# Patient Record
Sex: Female | Born: 1945 | Race: Black or African American | Hispanic: No | Marital: Single | Smoking: Never smoker
Health system: Southern US, Community
[De-identification: ages and names within clinical notes are randomized; demographics above are authoritative.]

## PROBLEM LIST (undated history)

## (undated) DIAGNOSIS — N393 Stress incontinence (female) (male): Secondary | ICD-10-CM

## (undated) DIAGNOSIS — F419 Anxiety disorder, unspecified: Secondary | ICD-10-CM

## (undated) DIAGNOSIS — K219 Gastro-esophageal reflux disease without esophagitis: Secondary | ICD-10-CM

## (undated) DIAGNOSIS — K5730 Diverticulosis of large intestine without perforation or abscess without bleeding: Secondary | ICD-10-CM

## (undated) DIAGNOSIS — R002 Palpitations: Secondary | ICD-10-CM

## (undated) DIAGNOSIS — J449 Chronic obstructive pulmonary disease, unspecified: Secondary | ICD-10-CM

## (undated) DIAGNOSIS — I10 Essential (primary) hypertension: Secondary | ICD-10-CM

## (undated) DIAGNOSIS — E663 Overweight: Secondary | ICD-10-CM

## (undated) DIAGNOSIS — R0789 Other chest pain: Secondary | ICD-10-CM

## (undated) DIAGNOSIS — J4489 Other specified chronic obstructive pulmonary disease: Secondary | ICD-10-CM

## (undated) HISTORY — PX: BREAST LUMPECTOMY: SHX2

## (undated) HISTORY — DX: Gastro-esophageal reflux disease without esophagitis: K21.9

## (undated) HISTORY — DX: Stress incontinence (female) (male): N39.3

## (undated) HISTORY — DX: Diverticulosis of large intestine without perforation or abscess without bleeding: K57.30

## (undated) HISTORY — DX: Other specified chronic obstructive pulmonary disease: J44.89

## (undated) HISTORY — DX: Anxiety disorder, unspecified: F41.9

## (undated) HISTORY — DX: Other chest pain: R07.89

## (undated) HISTORY — DX: Palpitations: R00.2

## (undated) HISTORY — DX: Essential (primary) hypertension: I10

## (undated) HISTORY — DX: Chronic obstructive pulmonary disease, unspecified: J44.9

## (undated) HISTORY — PX: ABDOMINAL HYSTERECTOMY: SHX81

## (undated) HISTORY — DX: Overweight: E66.3

---

## 2001-11-18 ENCOUNTER — Encounter: Payer: Self-pay | Admitting: Emergency Medicine

## 2001-11-18 ENCOUNTER — Emergency Department (HOSPITAL_COMMUNITY): Admission: EM | Admit: 2001-11-18 | Discharge: 2001-11-18 | Payer: Self-pay | Admitting: Emergency Medicine

## 2002-09-30 ENCOUNTER — Encounter: Payer: Self-pay | Admitting: Pulmonary Disease

## 2002-09-30 ENCOUNTER — Encounter: Admission: RE | Admit: 2002-09-30 | Discharge: 2002-09-30 | Payer: Self-pay | Admitting: Pulmonary Disease

## 2003-01-02 ENCOUNTER — Other Ambulatory Visit: Admission: RE | Admit: 2003-01-02 | Discharge: 2003-01-02 | Payer: Self-pay | Admitting: Obstetrics and Gynecology

## 2003-04-17 ENCOUNTER — Ambulatory Visit (HOSPITAL_COMMUNITY): Admission: RE | Admit: 2003-04-17 | Discharge: 2003-04-17 | Payer: Self-pay | Admitting: Gastroenterology

## 2004-09-01 ENCOUNTER — Ambulatory Visit: Payer: Self-pay | Admitting: Pulmonary Disease

## 2005-06-07 ENCOUNTER — Other Ambulatory Visit: Admission: RE | Admit: 2005-06-07 | Discharge: 2005-06-07 | Payer: Self-pay | Admitting: Obstetrics and Gynecology

## 2005-08-10 ENCOUNTER — Ambulatory Visit: Payer: Self-pay | Admitting: Pulmonary Disease

## 2005-11-16 ENCOUNTER — Ambulatory Visit: Payer: Self-pay | Admitting: Pulmonary Disease

## 2006-10-05 ENCOUNTER — Ambulatory Visit: Payer: Self-pay | Admitting: Pulmonary Disease

## 2007-08-28 ENCOUNTER — Ambulatory Visit (HOSPITAL_COMMUNITY): Admission: RE | Admit: 2007-08-28 | Discharge: 2007-08-28 | Payer: Self-pay | Admitting: Pulmonary Disease

## 2007-08-28 ENCOUNTER — Ambulatory Visit: Payer: Self-pay | Admitting: Pulmonary Disease

## 2007-09-04 DIAGNOSIS — J309 Allergic rhinitis, unspecified: Secondary | ICD-10-CM

## 2007-09-04 DIAGNOSIS — F411 Generalized anxiety disorder: Secondary | ICD-10-CM | POA: Insufficient documentation

## 2007-09-04 DIAGNOSIS — K219 Gastro-esophageal reflux disease without esophagitis: Secondary | ICD-10-CM | POA: Insufficient documentation

## 2007-09-04 DIAGNOSIS — I10 Essential (primary) hypertension: Secondary | ICD-10-CM | POA: Insufficient documentation

## 2007-09-20 ENCOUNTER — Encounter: Payer: Self-pay | Admitting: Pulmonary Disease

## 2007-10-04 ENCOUNTER — Telehealth (INDEPENDENT_AMBULATORY_CARE_PROVIDER_SITE_OTHER): Payer: Self-pay | Admitting: *Deleted

## 2007-12-18 ENCOUNTER — Ambulatory Visit: Payer: Self-pay | Admitting: Pulmonary Disease

## 2008-01-11 ENCOUNTER — Telehealth: Payer: Self-pay | Admitting: Pulmonary Disease

## 2008-02-20 ENCOUNTER — Telehealth (INDEPENDENT_AMBULATORY_CARE_PROVIDER_SITE_OTHER): Payer: Self-pay | Admitting: *Deleted

## 2008-02-21 ENCOUNTER — Encounter: Payer: Self-pay | Admitting: Pulmonary Disease

## 2008-04-16 ENCOUNTER — Telehealth: Payer: Self-pay | Admitting: Pulmonary Disease

## 2008-04-17 ENCOUNTER — Encounter: Payer: Self-pay | Admitting: Pulmonary Disease

## 2008-05-12 ENCOUNTER — Telehealth (INDEPENDENT_AMBULATORY_CARE_PROVIDER_SITE_OTHER): Payer: Self-pay | Admitting: *Deleted

## 2008-05-23 ENCOUNTER — Ambulatory Visit: Payer: Self-pay | Admitting: Pulmonary Disease

## 2008-05-23 DIAGNOSIS — R002 Palpitations: Secondary | ICD-10-CM | POA: Insufficient documentation

## 2008-05-23 DIAGNOSIS — J449 Chronic obstructive pulmonary disease, unspecified: Secondary | ICD-10-CM | POA: Insufficient documentation

## 2008-05-23 DIAGNOSIS — R0789 Other chest pain: Secondary | ICD-10-CM | POA: Insufficient documentation

## 2008-05-23 DIAGNOSIS — N39498 Other specified urinary incontinence: Secondary | ICD-10-CM | POA: Insufficient documentation

## 2008-06-05 ENCOUNTER — Ambulatory Visit: Payer: Self-pay | Admitting: Internal Medicine

## 2008-08-01 ENCOUNTER — Encounter: Payer: Self-pay | Admitting: Pulmonary Disease

## 2008-11-26 ENCOUNTER — Ambulatory Visit: Payer: Self-pay | Admitting: Pulmonary Disease

## 2008-11-26 DIAGNOSIS — K5289 Other specified noninfective gastroenteritis and colitis: Secondary | ICD-10-CM

## 2008-11-26 DIAGNOSIS — E663 Overweight: Secondary | ICD-10-CM | POA: Insufficient documentation

## 2008-11-26 DIAGNOSIS — K5730 Diverticulosis of large intestine without perforation or abscess without bleeding: Secondary | ICD-10-CM | POA: Insufficient documentation

## 2009-02-25 ENCOUNTER — Telehealth: Payer: Self-pay | Admitting: Pulmonary Disease

## 2009-05-26 ENCOUNTER — Ambulatory Visit: Payer: Self-pay | Admitting: Pulmonary Disease

## 2009-05-27 LAB — CONVERTED CEMR LAB
ALT: 19 units/L (ref 0–35)
AST: 19 units/L (ref 0–37)
Albumin: 4 g/dL (ref 3.5–5.2)
Alkaline Phosphatase: 76 units/L (ref 39–117)
BUN: 10 mg/dL (ref 6–23)
Basophils Absolute: 0 10*3/uL (ref 0.0–0.1)
Basophils Relative: 0.9 % (ref 0.0–3.0)
Bilirubin, Direct: 0 mg/dL (ref 0.0–0.3)
CO2: 31 meq/L (ref 19–32)
Calcium: 9.2 mg/dL (ref 8.4–10.5)
Chloride: 103 meq/L (ref 96–112)
Creatinine, Ser: 0.7 mg/dL (ref 0.4–1.2)
Eosinophils Absolute: 0.1 10*3/uL (ref 0.0–0.7)
Eosinophils Relative: 4 % (ref 0.0–5.0)
GFR calc non Af Amer: 109.37 mL/min (ref >60)
Glucose, Bld: 89 mg/dL (ref 70–99)
HCT: 35.7 % — ABNORMAL LOW (ref 36.0–46.0)
Hemoglobin: 12.3 g/dL (ref 12.0–15.0)
Lymphocytes Relative: 32.2 % (ref 12.0–46.0)
Lymphs Abs: 1.1 10*3/uL (ref 0.7–4.0)
MCHC: 34.3 g/dL (ref 30.0–36.0)
MCV: 87.1 fL (ref 78.0–100.0)
Monocytes Absolute: 0.4 10*3/uL (ref 0.1–1.0)
Monocytes Relative: 13 % — ABNORMAL HIGH (ref 3.0–12.0)
Neutro Abs: 1.8 10*3/uL (ref 1.4–7.7)
Neutrophils Relative %: 49.9 % (ref 43.0–77.0)
Platelets: 249 10*3/uL (ref 150.0–400.0)
Potassium: 3.1 meq/L — ABNORMAL LOW (ref 3.5–5.1)
RBC: 4.1 M/uL (ref 3.87–5.11)
RDW: 12.7 % (ref 11.5–14.6)
Sodium: 141 meq/L (ref 135–145)
TSH: 1.3 microintl units/mL (ref 0.35–5.50)
Total Bilirubin: 0.4 mg/dL (ref 0.3–1.2)
Total Protein: 8.2 g/dL (ref 6.0–8.3)
WBC: 3.4 10*3/uL — ABNORMAL LOW (ref 4.5–10.5)

## 2009-11-24 ENCOUNTER — Telehealth (INDEPENDENT_AMBULATORY_CARE_PROVIDER_SITE_OTHER): Payer: Self-pay | Admitting: *Deleted

## 2009-12-01 ENCOUNTER — Ambulatory Visit: Payer: Self-pay | Admitting: Pulmonary Disease

## 2009-12-21 ENCOUNTER — Telehealth: Payer: Self-pay | Admitting: Pulmonary Disease

## 2010-03-10 ENCOUNTER — Telehealth (INDEPENDENT_AMBULATORY_CARE_PROVIDER_SITE_OTHER): Payer: Self-pay | Admitting: *Deleted

## 2010-08-02 ENCOUNTER — Ambulatory Visit: Payer: Self-pay | Admitting: Pulmonary Disease

## 2010-08-05 ENCOUNTER — Telehealth (INDEPENDENT_AMBULATORY_CARE_PROVIDER_SITE_OTHER): Payer: Self-pay | Admitting: *Deleted

## 2010-11-16 NOTE — Assessment & Plan Note (Signed)
Summary: rov/ mbw   CC:  follow up. Marland Kitchen  History of Present Illness: 65 y/o BFwith known hx fo Asthma, GERD, HTN   ~  Followed for mod COPD/ Asthma and when seen 11/08 she was out of all of her meds x months... we refilled her Advair & Proair and she has improved, but still needs the Proair rescue 1-2x per day... 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  ~  She had atyp CP in 7/09 & seen in ER [facility]) in [place] & referred to [facility]- we do not have any of these records- she denies recurrent CP, palpit, etc...    ~  Aug09:  over the last year she has spent most of her energies trying to get disability- and she is happy to report to me today that she has qualified and started receiving her checks... hx of non-compliance w/ med Rx in the past- I called the [facility] in [place] and they confirmed that she has been taking her meds regularly...   ~  Feb10:  she reports a few "respiratory situations" over the last several months- all treated at the [facility] in [place] (staffed by [facility])... Oct09 Rx'd w/ Doxy;  Dec09 Rx'd w/ Doxy;  Jan10 Rx'd w/ Avelox... she states she's been taking her regular meds- all refilled today... she is here to get her disability form completed today...   ~  Aug10:  6 month ROV w/ disability forms to be completed today (every 6 months)... the hot weather bothers her breathing & she tries to stay cool indoors... she has mult symptoms and a generally pos ROS- all appears stable, no changes...     08/02/10--Presents for follow up and med refills. Since last visit says she is at her baseline. She has good days and bad days.  Gets winded with some activity and has to rest. No flare in cough or wheezing. She does not exercise on regular basis. No increased rescue inhaler use. She needs refills but left her list at home-will call back. On average uses her rescue  2/x month. We discussed getting labs today however she declined says she had them done in [place] recently-unclear at where [place] they were done at. I have suggested that she return for physical with Dr. Kriste Basque. She says she will schedule this. Denies chest pain,  orthopnea, hemoptysis, fever, n/v/d, edema, headache.   Current Medications (verified): 1)  Flonase 50 Mcg/act  Susp (Fluticasone Propionate) .... Inhale Two Sprays in Each Nostril Once Daily 2)  Allegra 180 Mg  Tabs (Fexofenadine Hcl) .... Take One Tab By Mouth Once Daily As Needed For Allergies 3)  Advair Diskus 250-50 Mcg/dose  Misc (Fluticasone-Salmeterol) .... Inhale One Puff Two Times A Day 4)  Ventolin Hfa 108 (90 Base) Mcg/act  Aers (Albuterol Sulfate) .Marland Kitchen.. 1-2 Puffs Every 4-6 Hours As Needed 5)  Bayer Low Strength 81 Mg  Tbec (Aspirin) .... Take One Tab By Mouth Once Daily 6)  Tenoretic 100 100-25 Mg  Tabs (Atenolol-Chlorthalidone) .... Take 1 Tab By Mouth Once Daily.Marland KitchenMarland Kitchen 7)  Diltiazem Hcl 90 Mg Tabs (Diltiazem Hcl) .Marland Kitchen.. 1 By Mouth Three Times A Day 8)  Potassium Chloride Cr 10 Meq Cr-Caps (Potassium Chloride) .... Take 2 Caps By Mouth Two Times A Day... 9)  Prilosec Otc 20 Mg  Tbec (Omeprazole Magnesium) .... Take Once Daily 10)  Zantac 300 Mg  Tabs (Ranitidine Hcl) .... Take One Tab By Mouth At Bedtime 11)  Dicyclomine Hcl 20 Mg Tabs (Dicyclomine Hcl) .... Take 1 Tab By Mouth Three Times A Day As Needed For Abd Cramping... 12)  Alprazolam 0.25 Mg Tabs (Alprazolam) .... Take 1 Tab By Mouth Up To Three Times A Day As Needed For Nerves. 13)  Singulair 10 Mg Tabs (Montelukast Sodium) .... Take One By Mouth Once Daily 14)  Tobramycin-Dexamethasone 0.3-0.1 % Susp (Tobramycin-Dexamethasone) .... 2 Drops in Both Eyes 3 Times A Day 15)  Artificial Tears  Soln (Artificial Tear Solution) .... Take As Needed For Dry Eyes  Allergies (verified): No Known Drug Allergies  Comments:  Nurse/Medical Assistant: The patient's medications and  allergies were reviewed with the patient and were updated in the Medication and Allergy Lists.  Past History:  Past Medical History: Last updated: 12/01/2009  ALLERGIC RHINITIS (ICD-477.9) CHRONIC OBSTRUCTIVE ASTHMA UNSPECIFIED (ICD-493.20) HYPERTENSION (ICD-401.9) CHEST PAIN, ATYPICAL (ICD-786.59) PALPITATIONS (ICD-785.1) OVERWEIGHT (ICD-278.02) GERD (ICD-530.81) DIVERTICULOSIS OF COLON (ICD-562.10) STRESS INCONTINENCE (ICD-788.39) ANXIETY (ICD-300.00)  Past Surgical History: Last updated: 12/01/2009 S/P hysterectomy  Family History: Last updated: 12/01/2009 mother alive age 77 w/ renal failure on hospice father deceased age 64 from kidney failure 1 sibling alive age 20  Social History: Last updated: 11/26/2008 non smoker drinks caffeine--1 cup every morning no etoh divorced 1 child  Review of Systems      See HPI  Vital Signs:  Patient profile:   64 year old female Height:      68 inches Weight:      206 pounds BMI:     31.44 O2 Sat:      98 % on Room air Temp:     97.9 degrees F oral Pulse rate:   86 / minute BP sitting:   140 / 68  (left arm) Cuff size:   large  Vitals Entered By: Abigail Miyamoto RN (August 02, 2010 3:25 PM)  O2 Flow:  Room air  Physical Exam  Additional Exam:  WD, Overweight, 64 y/o BF in NAD... GENERAL:  Alert & oriented; pleasant & cooperative...  HEENT:  [place]/AT, EACs-clear, TMs-wnl, NOSE-clear, THROAT-clear & wnl. NECK:  Supple w/ fairROM; no JVD; normal carotid impulses w/o bruits; no thyromegaly or nodules palpated; no lymphadenopathy. CHEST:  Sl decr BS bilat but clear to P & A; without wheezes/ rales/ or rhonchi heard... HEART:  Regular Rhythm; without murmurs/ rubs/ or gallops detected... ABDOMEN:  Obese, soft & nontender; incr bowel sounds; no organomegaly or masses palpated... EXT: without deformities, mild arthritic changes; no varicose veins/ +venous insuffic/ no edema. NEURO:  CN's intact;  no focal neuro  deficits... DERM:  No lesions noted; no rash etc...    Impression & Recommendations:  Problem # 1:  CHRONIC OBSTRUCTIVE ASTHMA UNSPECIFIED (ICD-493.20)  Compensated on present regimen.  Plan:  Continue on same meds.  Call back for med refills   follow up Dr. Kriste Basque in 6 months  Please contact office for sooner follow up as needed   Orders: Est. Patient Level IV (04540)  Problem # 2:  ALLERGIC RHINITIS (ICD-477.9)  controlled on rx. no flare recently  Her updated medication list for this problem includes:    Flonase 50 Mcg/act Susp (Fluticasone propionate) ..... Inhale two sprays in each nostril once daily    Allegra 180 Mg Tabs (Fexofenadine hcl) .Marland Kitchen... Take one tab by mouth once daily as needed for allergies  Orders: Est. Patient Level IV (98119)  Problem # 3:  HYPERTENSION (ICD-401.9)  controlled on rx  diet and exercise discussed  return for physical and labs.  Her updated medication list for this problem includes:    Tenoretic 100 100-25 Mg Tabs (Atenolol-chlorthalidone) .Marland Kitchen... Take 1 tab by mouth once daily...    Diltiazem Hcl 90 Mg Tabs (Diltiazem hcl) .Marland Kitchen... 1 by mouth three times a day  BP today: 140/68 Prior BP: 136/80 (12/01/2009)  Labs Reviewed: K+: 3.1 (05/26/2009) Creat: : 0.7 (05/26/2009)     Orders: Est. Patient Level IV (14782)  Problem # 4:  GERD (ICD-530.81)  controlled on rex GERD diet discussed  Her updated medication list for this problem includes:    Prilosec Otc 20 Mg Tbec (Omeprazole magnesium) .Marland Kitchen... Take once daily    Zantac 300 Mg Tabs (Ranitidine hcl) .Marland Kitchen... Take one tab by mouth at bedtime    Dicyclomine Hcl 20 Mg Tabs (Dicyclomine hcl) .Marland Kitchen... Take 1 tab by mouth three times a day as needed for abd cramping...  Orders: Est. Patient Level IV (95621)  Problem # 5:  ANXIETY (ICD-300.00)  stress reducers and xanax as needed  Her updated medication list for this problem includes:    Alprazolam 0.25 Mg Tabs (Alprazolam) .Marland Kitchen... Take 1 tab  by mouth up to three times a day as needed for nerves.  Orders: Est. Patient Level IV (30865)  Medications Added to Medication List This Visit: 1)  Singulair 10 Mg Tabs (Montelukast sodium) .... Take one by mouth once daily 2)  Tobramycin-dexamethasone 0.3-0.1 % Susp (Tobramycin-dexamethasone) .... 2 drops in both eyes 3 times a day 3)  Artificial Tears Soln (Artificial tear solution) .... Take as needed for dry eyes  Complete Medication List: 1)  Flonase 50 Mcg/act Susp (Fluticasone propionate) .... Inhale two sprays in each nostril once daily 2)  Advair Diskus 250-50 Mcg/dose Misc (Fluticasone-salmeterol) .... Inhale one puff two times a day 3)  Bayer Low Strength 81 Mg Tbec (Aspirin) .... Take one tab by mouth once daily 4)  Tenoretic 100 100-25 Mg Tabs (Atenolol-chlorthalidone) .... Take 1 tab by mouth once daily.Marland KitchenMarland Kitchen 5)  Diltiazem Hcl 90 Mg Tabs (Diltiazem hcl) .Marland Kitchen.. 1 by mouth three times a day 6)  Potassium Chloride Cr 10 Meq Cr-caps (Potassium chloride) .... Take 2 caps by mouth two times a day... 7)  Prilosec Otc 20 Mg Tbec (Omeprazole magnesium) .... Take once daily 8)  Zantac 300 Mg Tabs (Ranitidine hcl) .... Take one tab by mouth at bedtime 9)  Singulair 10 Mg Tabs (Montelukast sodium) .... Take one by mouth once daily 10)  Tobramycin-dexamethasone 0.3-0.1 % Susp (Tobramycin-dexamethasone) .... 2 drops in both eyes 3 times a day 11)  Artificial Tears Soln (Artificial tear solution) .... Take as needed for dry eyes 12)  Allegra 180 Mg Tabs (Fexofenadine hcl) .... Take one tab by mouth once daily as needed for allergies 13)  Alprazolam 0.25 Mg Tabs (Alprazolam) .... Take 1 tab by mouth up to three times a day as needed for nerves. 14)  Dicyclomine Hcl 20 Mg Tabs (Dicyclomine hcl) .... Take 1 tab by mouth three times a day as needed for abd cramping... 15)  Ventolin Hfa 108 (90 Base) Mcg/act Aers (Albuterol sulfate) .Marland Kitchen.. 1-2 puffs every 4-6 hours as needed  Patient Instructions: 1)   Continue on same meds.  2)  Call back for med refills  3)   follow up Dr. Kriste Basque in 6 months  4)  Please contact office for sooner follow up as needed  Prescriptions: VENTOLIN HFA 108 (90 BASE) MCG/ACT  AERS (ALBUTEROL SULFATE) 1-2 puffs every 4-6 hours as needed  #1 x 3   Entered and Authorized by:   Rubye Oaks NP   Signed by:   Tammy Parrett NP on 08/05/2010   Method used:   Electronically to        [facility] [phone]* (retail)       [address]       [place], [place]  [address]       Ph: [phone]       Fax: [phone]   RxID:   [id number] DICYCLOMINE HCL 20 MG TABS (DICYCLOMINE HCL) take 1 tab by mouth three times a day as needed for abd cramping...  #90 x 6   Entered and Authorized by:   Rubye Oaks NP   Signed by:   Tammy Parrett NP on 08/05/2010   Method used:   Electronically to        [facility] [phone]* (retail)       [address]       [place], [place]  [address]       Ph: [phone]       Fax: [phone]   RxID:   [id number] ALLEGRA 180 MG  TABS (FEXOFENADINE HCL) take one tab by mouth once daily as needed for allergies  #30 x 6   Entered and Authorized by:   Rubye Oaks NP   Signed by:   Tammy Parrett NP on 08/05/2010   Method used:   Electronically to        [facility] [phone]* (retail)       [address]       [place], [place]  [address]       Ph: [phone]       Fax: [phone]   RxID:   [id number] SINGULAIR 10 MG TABS (MONTELUKAST SODIUM) Take one by mouth once daily  #30 x 6   Entered and Authorized by:   Rubye Oaks NP   Signed by:   Tammy Parrett NP on 08/05/2010   Method used:   Electronically to        [facility] [phone]* (retail)       [address]       [place], [place]  [address]       Ph: [phone]       Fax: [phone]   RxID:   [id number] ZANTAC 300 MG  TABS (RANITIDINE HCL) take one tab by mouth at bedtime  #30 x 6   Entered and Authorized by:    Rubye Oaks NP   Signed by:   Tammy Parrett NP on 08/05/2010   Method used:   Electronically to        [facility] [phone]* (retail)       [address]       [place], [place]  [address]       Ph: [phone]       Fax: [phone]   RxID:   [id number] PRILOSEC OTC 20 MG  TBEC (OMEPRAZOLE MAGNESIUM) take once daily  #30 x 6   Entered and Authorized by:   Rubye Oaks NP   Signed by:   Tammy Parrett NP on 08/05/2010   Method used:   Electronically to        [facility] [phone]* (retail)       [address]  [place], [place]  [address]       Ph: [phone]       Fax: [phone]   RxID:   [id number] POTASSIUM CHLORIDE CR 10 MEQ CR-CAPS (POTASSIUM CHLORIDE) take 2 caps by mouth two times a day...  #120 x 6   Entered and Authorized by:   Rubye Oaks NP   Signed by:   Tammy Parrett NP on 08/05/2010   Method used:   Electronically to        [facility] [phone]* (retail)       [address]       [place], [place]  [address]       Ph: [phone]       Fax: [phone]   RxID:   [id number] DILTIAZEM HCL 90 MG TABS (DILTIAZEM HCL) 1 by mouth three times a day  #90 x 6   Entered and Authorized by:   Rubye Oaks NP   Signed by:   Tammy Parrett NP on 08/05/2010   Method used:   Electronically to        [facility] [phone]* (retail)       [address]       [place], [place]  [address]       Ph: [phone]       Fax: [phone]   RxID:   [id number] TENORETIC 100 100-25 MG  TABS (ATENOLOL-CHLORTHALIDONE) take 1 tab by mouth once daily...  #30 x 6   Entered and Authorized by:   Rubye Oaks NP   Signed by:   Tammy Parrett NP on 08/05/2010   Method used:   Electronically to        [facility] [phone]* (retail)       [address]       [place], [place]  [address]       Ph: [phone]       Fax: [phone]   RxID:   [id number] ADVAIR DISKUS 250-50 MCG/DOSE  MISC  (FLUTICASONE-SALMETEROL) inhale one puff two times a day  #1 x 6   Entered and Authorized by:   Rubye Oaks NP   Signed by:   Tammy Parrett NP on 08/05/2010   Method used:   Electronically to        [facility] [phone]* (retail)       [address]       [place], [place]  [address]       Ph: [phone]       Fax: [phone]   RxID:   [id number] FLONASE 50 MCG/ACT  SUSP (FLUTICASONE PROPIONATE) inhale two sprays in each nostril once daily  #1 x 6   Entered and Authorized by:   Rubye Oaks NP   Signed by:   Tammy Parrett NP on 08/05/2010   Method used:   Electronically to        [facility] [phone]* (retail)       [address]       [place], [place]  [address]       Ph: [phone]       Fax: [phone]   RxID:   [id number]

## 2010-11-16 NOTE — Assessment & Plan Note (Signed)
Summary: 6 month rov//td   CC:  6 month ROV & review of mult medical problems....  History of Present Illness: 65 y/o BF here for a follow up visit... she has multiple medical problems as noted below...    ~  Followed for mod COPD/ Asthma and when seen 11/08 she was out of all of her meds x months... we refilled her Advair & Proair and she has improved, but still needs the Proair rescue 1-2x per day... 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  ~  She had atyp CP in 7/09 & seen in ER [facility]) in [place] & referred to [facility]- we do not have any of these records- she denies recurrent CP, palpit, etc...    ~  Aug09:  over the last year she has spent most of her energies trying to get disability- and she is happy to report to me today that she has qualified and started receiving her checks... hx of non-compliance w/ med Rx in the past- I called the [facility] in [place] and they confirmed that she has been taking her meds regularly...   ~  Feb10:  she reports a few "respiratory situations" over the last several months- all treated at the [facility] in [place] (staffed by [facility])... Oct09 Rx'd w/ Doxy;  Dec09 Rx'd w/ Doxy;  Jan10 Rx'd w/ Avelox... she states she's been taking her regular meds- all refilled today... she is here to get her disability form completed today...   ~  Aug10:  6 month ROV w/ disability forms to be completed today (every 6 months)... the hot weather bothers her breathing & she tries to stay cool indoors... she has mult symptoms and a generally pos ROS- all appears stable, no changes...   ~  December 01, 2009:  ROV w/ disability papers recently completed for her... incr stress w/ mother on hospice in [place] due to renal failure- they live together & Eulah cares for her... asking for nerve pill & ALPRAZOLAM 0.25mg  written... she had a viral gastroenteritis 10/10,  seen in the [facility] clinic & resolved... she reports breathing is stable;  BP controleed on meds;  had G[place] check in [place] at [facility] but they didn't say anything about her leakage... due for f/u PFT's today>>   Current Problem List:  ALLERGIC RHINITIS (ICD-477.9) - on ALLEGRA 180mg /d, & FLONASE Qhs...   CHRONIC OBSTRUCTIVE ASTHMA UNSPECIFIED (ICD-493.20) - on ADVAIR 250Bid,  PROAIR HFA Prn (using 2-3 times daily),  MUCINEX 1-2Bid... she states no recent exas, taking her meds regularly although she still uses her Proair daily... she denies cough, sputum, hemoptysis, worsening dyspnea,  wheezing, chest pains, snoring, daytime hypersomnolence, etc...   ~  baseline CXR 11/08 showed sl hyperaeration, clear, NAD.Marland Kitchen. spondylitic changes in TSpine noted.  ~  PFT 3/09 showed FVC= 3.36 (92%), FEV1= 2.13 (78%), %1sec= 63, mid-flows= 26% pred;  TLC= 6.61 (112%), RV= 3.25 (145%), RV/TLC= 49%;  DLCO= norm...  ~  f/u CXR 8/10 showed min bibasilar atx, NAD...  ~  PFT 2/11 showed FVC= 2.22 (73%), FEV1= 1.68 (70%), %1sec=76, mid-flows= 57%pred.  HYPERTENSION (ICD-401.9) - controlled on TENORETIC 100-25 daily,  KCl CAPSULES- 2 Bid,  DILTIAZEM 90mg Tid (from [employer])... BP today = 136/80 and doing well- denies HA, fatigue, visual changes, CP, palipit, dizziness, syncope, dyspnea, edema, etc...  ~  labs 8/10 showed K= 3.1,  BUN= 10, Creat= 0.7... rec> incr K10 to 2Bid...  CHEST PAIN, ATYPICAL (ICD-786.59) - **see above** - she denies recurrent CP, exertional CP, etc...  ~  FLP 12/08 by DrVyas at [facility] showed TChol 130, TG 107, HDL 39, LDL 70...  ~  pt notes she had 2DEcho at [facility] & will request copies of notes to [place]...  PALPITATIONS (ICD-785.1) - hx palpitations in the past... no recent symptoms on meds above...  OVERWEIGHT (ICD-278.02) - we reviewed diet + exercise.  ~  weight 8/09 = 209#  ~  weight 2/10 = 213#  ~  weight 8/10 = 206#  ~  weight 2/11 = 206#  GERD (ICD-530.81) - on PRILOSEC 20mg Qam,   ZANTAC 300mg Qhs... she has a hx of GERD and LER controlled on these meds...   DIVERTICULOSIS OF COLON (ICD-562.10) - last colonoscopy 7/04 by DrJEdwards- showed divertics only...  STRESS INCONTINENCE (ICD-788.39) - she claims leakage of urine and stool- she has been recommended to f/u w/ GYN for this eval...  ~  2/11:  she reports eval at [facility] in [place] 10/10 but they didn't address leakage...  ANXIETY (ICD-300.00) - states that she is under stress due to her condition and having to care for her mother (on hospice w/ renal failure)... Rx for ALPRAZOLAM 0.25mg  Tid Prn written...   Allergies (verified): No Known Drug Allergies  Comments:  Nurse/Medical Assistant: The patient's medications and allergies were reviewed with the patient and were updated in the Medication and Allergy Lists.  Past History:  Past Medical History:  ALLERGIC RHINITIS (ICD-477.9) CHRONIC OBSTRUCTIVE ASTHMA UNSPECIFIED (ICD-493.20) HYPERTENSION (ICD-401.9) CHEST PAIN, ATYPICAL (ICD-786.59) PALPITATIONS (ICD-785.1) OVERWEIGHT (ICD-278.02) GERD (ICD-530.81) DIVERTICULOSIS OF COLON (ICD-562.10) STRESS INCONTINENCE (ICD-788.39) ANXIETY (ICD-300.00)  Past Surgical History: S/P hysterectomy  Family History: Reviewed history from 05/23/2008 and no changes required. mother alive age 32 w/ renal failure on hospice father deceased age 61 from kidney failure 1 sibling alive age 87  Social History: Reviewed history from 11/26/2008 and no changes required. non smoker drinks caffeine--1 cup every morning no etoh divorced 1 child  Review of Systems      See HPI       The patient complains of dyspnea on exertion, incontinence, and difficulty walking.  The patient denies anorexia, fever, weight loss, weight gain, vision loss, decreased hearing, hoarseness, chest pain, syncope, peripheral edema, prolonged cough, headaches, hemoptysis, abdominal pain, melena, hematochezia, severe  indigestion/heartburn, hematuria, muscle weakness, suspicious skin lesions, transient blindness, depression, unusual weight change, abnormal bleeding, enlarged lymph nodes, and angioedema.    Vital Signs:  Patient profile:   65 year old female Height:      68 inches Weight:      205.50 pounds O2 Sat:      99 % on Room air Temp:     97.8 degrees F oral Pulse rate:   85 / minute BP sitting:   136 / 80  (left arm) Cuff size:   regular  Vitals Entered By: Randell Loop CMA (December 01, 2009 1:58 PM)  O2 Sat at Rest %:  99 O2 Flow:  Room air CC: 6 month ROV & review of mult medical problems...   Physical Exam  Additional Exam:  WD, Overweight, 65 y/o BF in NAD... GENERAL:  Alert & oriented; pleasant & cooperative...  HEENT:  [place]/AT, EOM-wnl, PERRLA, EACs-clear, TMs-wnl, NOSE-clear, THROAT-clear & wnl. NECK:  Supple w/ fairROM; no JVD; normal carotid impulses w/o bruits; no thyromegaly or nodules palpated; no lymphadenopathy. CHEST:  Sl  decr BS bilat but clear to P & A; without wheezes/ rales/ or rhonchi heard... HEART:  Regular Rhythm; without murmurs/ rubs/ or gallops detected... ABDOMEN:  Obese, soft & nontender; incr bowel sounds; no organomegaly or masses palpated... EXT: without deformities, mild arthritic changes; no varicose veins/ +venous insuffic/ no edema. NEURO:  CN's intact;  no focal neuro deficits... DERM:  No lesions noted; no rash etc...    Pulmonary Function Test Date: 12/01/2009 2:37 PM Gender: Female  Pre-Spirometry FVC    Value: 2.22 L/min   Pred: 3.06 L/min     % Pred: 73 % FEV1    Value: 1.68 L     Pred: 2.41 L     % Pred: 70 % FEV1/FVC  Value: 76 %     Pred: -- %     % Pred: -- % FEF 25-75  Value: 1.32 L/min   Pred: 2.31 L/min     % Pred: 57 %  Comments: Mild obstruction- predom sm airways... can't r/o superimposed mild restriction w/o LV's...  SN  Impression & Recommendations:  Problem # 1:  CHRONIC OBSTRUCTIVE ASTHMA UNSPECIFIED  (ICD-493.20) Stable on meds... refilled Advair/ Proair & rec to use the Proair sparingly... Her updated medication list for this problem includes:    Advair Diskus 250-50 Mcg/dose Misc (Fluticasone-salmeterol) ..... Inhale one puff two times a day    Ventolin Hfa 108 (90 Base) Mcg/act Aers (Albuterol sulfate) .Marland Kitchen... 1-2 puffs every 4-6 hours as needed  Orders: Spirometry w/Graph (94010)  Problem # 2:  HYPERTENSION (ICD-401.9) Controlled-  same meds. Her updated medication list for this problem includes:    Tenoretic 100 100-25 Mg Tabs (Atenolol-chlorthalidone) .Marland Kitchen... Take 1 tab by mouth once daily...    Diltiazem Hcl 90 Mg Tabs (Diltiazem hcl) .Marland Kitchen... 1 by mouth three times a day  Problem # 3:  PALPITATIONS (ICD-785.1) She denies symptoms & will f/u at [facility] w/ copy of notes to [place]... Her updated medication list for this problem includes:    Tenoretic 100 100-25 Mg Tabs (Atenolol-chlorthalidone) .Marland Kitchen... Take 1 tab by mouth once daily...  Problem # 4:  OVERWEIGHT (ICD-278.02) Diet + exercise are the keys...  Problem # 5:  DIVERTICULOSIS OF COLON (ICD-562.10) GI is stable...  Problem # 6:  STRESS INCONTINENCE (ICD-788.39) She did not mention this today- advised to discuss w/ GYN if the problem is ongoing...  Problem # 7:  ANXIETY (ICD-300.00) Alprazolam 0.25mg  Prn...  Her updated medication list for this problem includes:    Alprazolam 0.25 Mg Tabs (Alprazolam) .Marland Kitchen... Take 1 tab by mouth up to three times a day as needed for nerves.  Complete Medication List: 1)  Flonase 50 Mcg/act Susp (Fluticasone propionate) .... Inhale two sprays in each nostril once daily 2)  Allegra 180 Mg Tabs (Fexofenadine hcl) .... Take one tab by mouth once daily as needed for allergies 3)  Advair Diskus 250-50 Mcg/dose Misc (Fluticasone-salmeterol) .... Inhale one puff two times a day 4)  Ventolin Hfa 108 (90 Base) Mcg/act Aers (Albuterol sulfate) .Marland Kitchen.. 1-2 puffs every 4-6 hours as needed 5)  Bayer Low Strength  81 Mg Tbec (Aspirin) .... Take one tab by mouth once daily 6)  Tenoretic 100 100-25 Mg Tabs (Atenolol-chlorthalidone) .... Take 1 tab by mouth once daily.Marland KitchenMarland Kitchen 7)  Diltiazem Hcl 90 Mg Tabs (Diltiazem hcl) .Marland Kitchen.. 1 by mouth three times a day 8)  Potassium Chloride Cr 10 Meq Cr-caps (Potassium chloride) .... Take 2 caps by mouth two times a day... 9)  Prilosec  Otc 20 Mg Tbec (Omeprazole magnesium) .... Take once daily 10)  Zantac 300 Mg Tabs (Ranitidine hcl) .... Take one tab by mouth at bedtime 11)  Dicyclomine Hcl 20 Mg Tabs (Dicyclomine hcl) .... Take 1 tab by mouth three times a day as needed for abd cramping... 12)  Alprazolam 0.25 Mg Tabs (Alprazolam) .... Take 1 tab by mouth up to three times a day as needed for nerves.  Other Orders: Prescription Created Electronically [phone])  Patient Instructions: 1)  Today we updated your med list- see below.... 2)  We refilled your meds per request including the KCl CAPSULES that you prefer... 3)  We rechecked your Spirometry today- your breathing is stable & you need to stay on your inhalers regularly... 4)  Ask your cardiologist in [place] at [facility] to send a copy of your follow up visit to me please... 5)  Call for any problems.Marland KitchenMarland Kitchen 6)  Please schedule a follow-up appointment in 6 months. Prescriptions: ALPRAZOLAM 0.25 MG TABS (ALPRAZOLAM) take 1 tab by mouth up to three times a day as needed for nerves.  #100 x 6   Entered and Authorized by:   Michele Mcalpine MD   Signed by:   Michele Mcalpine MD on 12/02/2009   Method used:   Print then Give to Patient   RxID:   [id number] DICYCLOMINE HCL 20 MG TABS (DICYCLOMINE HCL) take 1 tab by mouth three times a day as needed for abd cramping...  #90 x prn   Entered and Authorized by:   Michele Mcalpine MD   Signed by:   Michele Mcalpine MD on 12/01/2009   Method used:   Print then Give to Patient   RxID:   [id number] ZANTAC 300 MG  TABS (RANITIDINE HCL) take one tab by mouth at bedtime  #30 x prn    Entered and Authorized by:   Michele Mcalpine MD   Signed by:   Michele Mcalpine MD on 12/01/2009   Method used:   Print then Give to Patient   RxID:   [id number] PRILOSEC OTC 20 MG  TBEC (OMEPRAZOLE MAGNESIUM) take once daily  #30 x prn   Entered and Authorized by:   Michele Mcalpine MD   Signed by:   Michele Mcalpine MD on 12/01/2009   Method used:   Print then Give to Patient   RxID:   [id number] TENORETIC 100 100-25 MG  TABS (ATENOLOL-CHLORTHALIDONE) take 1 tab by mouth once daily...  #30 x prn   Entered and Authorized by:   Michele Mcalpine MD   Signed by:   Michele Mcalpine MD on 12/01/2009   Method used:   Print then Give to Patient   RxID:   [id number] DILTIAZEM HCL 90 MG TABS (DILTIAZEM HCL) 1 by mouth three times a day  #90 x prn   Entered and Authorized by:   Michele Mcalpine MD   Signed by:   Michele Mcalpine MD on 12/01/2009   Method used:   Print then Give to Patient   RxID:   [id number] VENTOLIN HFA 108 (90 BASE) MCG/ACT  AERS (ALBUTEROL SULFATE) 1-2 puffs every 4-6 hours as needed  #1 x prn   Entered and Authorized by:   Michele Mcalpine MD   Signed by:   Michele Mcalpine MD on 12/01/2009   Method used:   Print then Give to Patient   RxID:   [id number] ADVAIR DISKUS 250-50 MCG/DOSE  MISC (  FLUTICASONE-SALMETEROL) inhale one puff two times a day  #1 x prn   Entered and Authorized by:   Michele Mcalpine MD   Signed by:   Michele Mcalpine MD on 12/01/2009   Method used:   Print then Give to Patient   RxID:   [id number] ALLEGRA 180 MG  TABS (FEXOFENADINE HCL) take one tab by mouth once daily as needed for allergies  #30 x prn   Entered and Authorized by:   Michele Mcalpine MD   Signed by:   Michele Mcalpine MD on 12/01/2009   Method used:   Print then Give to Patient   RxID:   [id number] FLONASE 50 MCG/ACT  SUSP (FLUTICASONE PROPIONATE) inhale two sprays in each nostril once daily  #1 x prn   Entered and Authorized by:   Michele Mcalpine MD   Signed by:   Michele Mcalpine MD  on 12/01/2009   Method used:   Print then Give to Patient   RxID:   [id number] POTASSIUM CHLORIDE CR 10 MEQ CR-CAPS (POTASSIUM CHLORIDE) take 2 caps by mouth two times a day...  #120 x prn   Entered and Authorized by:   Michele Mcalpine MD   Signed by:   Michele Mcalpine MD on 12/01/2009   Method used:   Print then Give to Patient   RxID:   [id number]     CardioPerfect Spirometry  ID: [id number] Patient: Rachael Peck, Rachael Peck DOB: 08-08-1946 Age: 65 Years Old Sex: Female Race: Black Physician: [place] Height: 68 Weight: 205.50 Status: Unconfirmed Past Medical History:  ALLERGIC RHINITIS (ICD-477.9) CHRONIC OBSTRUCTIVE ASTHMA UNSPECIFIED (ICD-493.20) HYPERTENSION (ICD-401.9) CHEST PAIN, ATYPICAL (ICD-786.59) PALPITATIONS (ICD-785.1) OVERWEIGHT (ICD-278.02) GERD (ICD-530.81) DIVERTICULOSIS OF COLON (ICD-562.10) STRESS INCONTINENCE (ICD-788.39) ANXIETY (ICD-300.00)   Recorded: 12/01/2009 2:37 PM  Parameter  Measured Predicted %Predicted FVC     2.22        3.06        73 FEV1     1.68        2.41        70 FEV1%   76        79.22        -- PEF    4.20        6.16        68.20   Interpretation:

## 2010-11-16 NOTE — Progress Notes (Signed)
Summary: formulary  Phone Note Call from Patient Call back at Home Phone [phone]   Caller: Patient Call For: Abrar Koone Reason for Call: Talk to Nurse Summary of Call: [employer] is not covering Ventolin Inhaler.  They said they have other cheaper drugs they cover.  Said you need to [employer].  If you want pt to stay on this drug, you can do an exception with insurance company [phone]. Initial call taken by: Eugene Gavia,  December 21, 2009 3:13 PM  Follow-up for Phone Call        spoke with Francine Graven and they staetd ventolin is covered at $5 fro 30 days. I called pt to advise and she read me the letter she received and the problem was she tried to get it refilled too soon. I advised she cant get med refilled too soon and if she is needing it hat much she needs ov. Carron Curie CMA  December 21, 2009 3:38 PM

## 2010-11-16 NOTE — Progress Notes (Signed)
Summary: medications  ---- Converted from flag ---- ---- 08/05/2010 9:38 AM, Rubye Oaks NP wrote: please call pt let know sent refills of all meds.  also please send xanax under Dr. Kriste Basque w/ 5 refills. ------------------------------  Phone Note Outgoing Call   Call placed by: Zackery Barefoot CMA,  August 05, 2010 10:56 AM Summary of Call: [email or web address] x 1 Zackery Barefoot [facility]  August 05, 2010 10:56 AM   Follow-up for Phone Call        called spoke with patient.  informed her that all meds were refilled at her last ov w/ TP.  xanax called into pharmacy of choice.  pt verbalized her understanding. Boone Master CNA/MA  August 10, 2010 11:55 AM     Prescriptions: ALPRAZOLAM 0.25 MG TABS (ALPRAZOLAM) take 1 tab by mouth up to three times a day as needed for nerves.  #100 x 5   Entered by:   Boone Master CNA/MA   Authorized by:   Michele Mcalpine MD   Signed by:   Boone Master CNA/MA on 08/10/2010   Method used:   Telephoned to ...       [facility] [address] [phone]* (retail)       [address]       [place], [place]  [address]       Ph: [phone]       Fax: [phone]   RxID:   [id number]

## 2010-11-16 NOTE — Progress Notes (Signed)
Summary: form  Phone Note Call from Patient Call back at Home Phone [phone]   Caller: Patient Call For: nadel Reason for Call: Talk to Nurse Summary of Call: need credit card protection form signed.  Would like to pick up this afternoon.  Form can also be stamped w/signature.  Form at [employer] Initial call taken by: Eugene Gavia,  November 24, 2009 1:23 PM  Follow-up for Phone Call        form given to [facility] to have SN sign.  Aundra Millet Reynolds LPN  November 24, 2009 2:01 PM    form has been signed by SN---copied and placed in scan folder and the original has been placed up front for pt to pick up---pt is aware that the form is ready Randell Loop CMA  November 24, 2009 2:55 PM

## 2010-11-16 NOTE — Progress Notes (Signed)
Summary: disability forms  Phone Note Call from Patient   Caller: Patient Call For: nadel Summary of Call: pt dropped off papers for dr nadel today re: disability. melanie says she gave these to leigh. pt wants to know if these have been signed yet as she wants to pick them up. (really) cell [phone]. note: pt says this is the last one of these dr nadel will have to sign.  Initial call taken by: Tivis Ringer, CNA,  Mar 10, 2010 3:08 PM  Follow-up for Phone Call        form has been signed and given back to the pt Randell Loop CMA  Mar 10, 2010 4:50 PM

## 2011-02-01 ENCOUNTER — Encounter: Payer: Self-pay | Admitting: Pulmonary Disease

## 2011-02-02 ENCOUNTER — Encounter: Payer: Self-pay | Admitting: Pulmonary Disease

## 2011-02-02 ENCOUNTER — Ambulatory Visit (INDEPENDENT_AMBULATORY_CARE_PROVIDER_SITE_OTHER): Payer: Medicare Other | Admitting: Pulmonary Disease

## 2011-02-02 DIAGNOSIS — I10 Essential (primary) hypertension: Secondary | ICD-10-CM

## 2011-02-02 DIAGNOSIS — J309 Allergic rhinitis, unspecified: Secondary | ICD-10-CM

## 2011-02-02 DIAGNOSIS — J449 Chronic obstructive pulmonary disease, unspecified: Secondary | ICD-10-CM

## 2011-02-02 DIAGNOSIS — K5730 Diverticulosis of large intestine without perforation or abscess without bleeding: Secondary | ICD-10-CM

## 2011-02-02 DIAGNOSIS — N39498 Other specified urinary incontinence: Secondary | ICD-10-CM

## 2011-02-02 DIAGNOSIS — R0789 Other chest pain: Secondary | ICD-10-CM

## 2011-02-02 DIAGNOSIS — F411 Generalized anxiety disorder: Secondary | ICD-10-CM

## 2011-02-02 DIAGNOSIS — R002 Palpitations: Secondary | ICD-10-CM

## 2011-02-02 DIAGNOSIS — K219 Gastro-esophageal reflux disease without esophagitis: Secondary | ICD-10-CM

## 2011-02-02 MED ORDER — HYOSCYAMINE SULFATE 0.125 MG PO TABS: 0.1250 mg | ORAL_TABLET | Freq: Three times a day (TID) | ORAL | Status: DC | PRN

## 2011-02-02 MED ORDER — ALBUTEROL SULFATE HFA 108 (90 BASE) MCG/ACT IN AERS: 2.0000 | INHALATION_SPRAY | Freq: Four times a day (QID) | RESPIRATORY_TRACT | Status: DC | PRN

## 2011-02-02 MED ORDER — IPRATROPIUM BROMIDE HFA 17 MCG/ACT IN AERS: 2.0000 | INHALATION_SPRAY | Freq: Four times a day (QID) | RESPIRATORY_TRACT | Status: DC

## 2011-02-02 MED ORDER — FLUTICASONE PROPIONATE HFA 220 MCG/ACT IN AERO: 2.0000 | INHALATION_SPRAY | Freq: Two times a day (BID) | RESPIRATORY_TRACT | Status: DC

## 2011-02-02 MED ORDER — ALPRAZOLAM 0.25 MG PO TABS: 0.5000 mg | ORAL_TABLET | Freq: Three times a day (TID) | ORAL | Status: DC | PRN

## 2011-02-02 NOTE — Progress Notes (Signed)
Subjective:    Patient ID: Rachael Peck, female    DOB: 1946-04-02, 65 y.o.   MRN: [id number]  HPI 65 y/o BF here for a follow up visit... she has multiple medical problems including:  AR;  Asthma;  HBP;  Hx atypCP & palpit;  Overweight;  GERD;  Divertics;  Stress incontinence;  Anxiety... She lives in [place] and she tells me she is followed for general medical purposes in the [facility] & was referred to [facility] for cardiac eval- we do not have any of these records- she denies recurrent CP, palpit, etc...   ~  December 01, 2009:  ROV w/ disability papers recently completed for her... incr stress w/ mother on hospice in [place] due to renal failure- they live together & Caffie cares for her... asking for nerve pill & ALPRAZOLAM 0.25mg  written... she had a viral gastroenteritis 10/10, seen in the [facility] clinic & resolved... she reports breathing is stable;  BP controlled on meds;  had GYN check in [place] at [facility] but they didn't say anything about her leakage... due for f/u PFT's today> see below prob list...  ~  February 02, 2011:  14 month ROV & her social situation is the same- living in [place] & receives her general care via the [facility], DrCopper, she says they do her blood work as well & she does not want additional labs here today;  Her Asthma remains poorly controlled (she has "episodes every month & goes to the clinic that often), mostly due to medication compliance issues, & it appears that the Advair may not be covered but she is vague about this;  She was given Atrovent inhaler at the Clinic & she feels this helps> we discussed adjusting Rx for least expensive regimen: try Albuterol MDI- 2spQid, Atrovent- 2spQid, & Flovent 220- 2spBid;  We reviewed her current med list in detail & reconciled the list as below...       Problem List:  ALLERGIC RHINITIS (ICD-477.9) - on ALLEGRA 180mg /d, & FLONASE Qhs...   CHRONIC OBSTRUCTIVE ASTHMA UNSPECIFIED (ICD-493.20) -  on ADVAIR 250Bid (but not compliant),  PROAIR HFA Prn (using 2-3 times daily), ATROVENT 2spQid (started by the [facility] clinic in [place]), & MUCINEX 1-2Bid... ~  baseline CXR 11/08 showed sl hyperaeration, clear, NAD.Marland Kitchen. spondylitic changes in TSpine noted. ~  PFT 3/09 showed FVC= 3.36 (92%), FEV1= 2.13 (78%), %1sec= 63, mid-flows= 26% pred;  TLC= 6.61 (112%), RV= 3.25 (145%), RV/TLC= 49%;  DLCO= norm... ~  f/u CXR 8/10 showed min bibasilar atx, NAD... ~  PFT 2/11 showed FVC= 2.22 (73%), FEV1= 1.68 (70%), %1sec=76, mid-flows= 57%pred. ~  4/12:  38mo ROV here & she tells me she is getting her care thru the [facility] in [place]; just wants refills today- declines CXR, labs, etc; she hasn't been taking the Advair due to cost & we discussed regimen w/ ALBUTEROL 2spQid, ATROVENT 2spQid, FLOVENT 220- 2spBid, Singulair 10mg /d, Mucinex 2Bid, fluids, etc...  HYPERTENSION (ICD-401.9) - controlled on TENORETIC 100-25 daily,  KCl CAPSULES- taking 1 Bid,  & AMLODIPINE 5mg /d (from [employer])... ~  labs 8/10 showed K= 3.1, BUN= 10, Creat= 0.7... rec> incr K10 to 2Bid... ~  subseq labs all done in the [facility] clinic she says & they cut her KCl to 1Bid... ~  4/12:  BP today = 138/80 and doing well- denies HA, fatigue, visual changes, CP, palipit, dizziness, syncope, dyspnea, edema, etc...  CHEST PAIN, ATYPICAL (ICD-786.59) -  on ASA 81mg /d... she denies recurrent CP, exertional CP, etc... ~  FLP 12/08 by [facility]yas at [facility] showed TChol 130, TG 107, HDL 39, LDL 70... ~  pt notes she had 2DEcho at [facility] & will request copies of notes to [place] (never received)...  PALPITATIONS (ICD-785.1) - hx palpitations in the past... no recent symptoms on meds above & she knows to avoid caffeine, etc...  OVERWEIGHT (ICD-278.02) - we reviewed diet + exercise. ~  weight 8/09 = 209# ~  weight 2/10 = 213# ~  weight 8/10 = 206# ~  weight 2/11 = 206# ~  Weight 4/12 = 207#  GERD (ICD-530.81) - on PRILOSEC 20mg Qam,  ZANTAC 300mg Qhs...  she has a hx of GERD and LER controlled on these meds...   DIVERTICULOSIS OF COLON (ICD-562.10) - last colonoscopy 7/04 by DrJEdwards- showed divertics only... ~  4/12:  She has some classic IBS-like symptoms & we will try LEVSIN 0.125mg  Tid as needed...  STRESS INCONTINENCE (ICD-788.39) - she claims leakage of urine and stool- she has been recommended to f/u w/ GYN for this eval... ~  2/11:  she reports eval at [facility] in [place] 10/10 but they didn't address leakage... ~  4/12:  She reports that the [facility] clinic doctors have started Rx w/ OXYBUTYNIN 15mg /d for overactive bladder symptoms...  ANXIETY (ICD-300.00) - states that she is under stress due to her condition and having to care for her mother (on hospice w/ renal failure)... Rx for ALPRAZOLAM 0.25mg  Tid Prn written... ~  4/12:  She reports that [facility] clinic doctors have prescribed TRAZODONE 50mg  Qhs for sleep...   Past Surgical History  Procedure Date  . Abdominal hysterectomy     Outpatient Encounter Prescriptions as of 02/02/2011  Medication Sig Dispense Refill  . albuterol (VENTOLIN HFA) 108 (90 BASE) MCG/ACT inhaler Inhale 2 puffs into the lungs every 6 (six) hours as needed for wheezing. 1-2 puffs every 4-6 hrs as needed  1 Inhaler  11  . ALPRAZolam (XANAX) 0.25 MG tablet Take 1 tablet up to 3 times a day as needed for nerves  90 tablet  5  . amLODipine (NORVASC) 5 MG tablet Take 5 mg by mouth daily.        . ARTIFICIAL TEARS ophthalmic solution As needed for dry eyes       . aspirin (BAYER LOW STRENGTH) 81 MG EC tablet Take 81 mg by mouth daily.        Marland Kitchen atenolol-chlorthalidone (TENORETIC) 100-25 MG per tablet Take 1 tablet by mouth daily.        . fexofenadine (ALLEGRA) 180 MG tablet Once a day as needed for allergies       . fluticasone (FLONASE) 50 MCG/ACT nasal spray 2 sprays each nostril once a day       . ipratropium (ATROVENT HFA) 17 MCG/ACT inhaler Inhale 2 puffs into the lungs 4 (four) times daily.  1 Inhaler   11  . montelukast (SINGULAIR) 10 MG tablet Once a day       . omeprazole (PRILOSEC OTC) 20 MG tablet Take 20 mg by mouth daily, 30 min before 1st meal...       . oxybutynin (DITROPAN XL) 15 MG 24 hr tablet Take 15 mg by mouth daily.        . potassium chloride (MICRO-K) 10 MEQ CR capsule 2 caps by mouth 2 times a day   ==> [facility] changed to 1 Bid per pt...    . ranitidine (ZANTAC)  300 MG tablet Take 1 tab by mouth at bedtime       . traZODone (DESYREL) 50 MG tablet Take 50 mg by mouth at bedtime. For sleep       . fluticasone (FLOVENT HFA) 220 MCG/ACT inhaler Inhale 2 puffs into the lungs 2 (two) times daily.  1 Inhaler  11        . hyoscyamine (LEVSIN) 0.125 MG tablet Take 1 tablet (0.125 mg total) by mouth 3 (three) times daily as needed for cramping.  90 tablet  11          No Known Allergies   Review of Systems         See HPI - all other systems neg except as noted... The patient complains of dyspnea on exertion, incontinence, and difficulty walking.  The patient denies anorexia, fever, weight loss, weight gain, vision loss, decreased hearing, hoarseness, chest pain, syncope, peripheral edema, prolonged cough, headaches, hemoptysis, abdominal pain, melena, hematochezia, severe indigestion/heartburn, hematuria, muscle weakness, suspicious skin lesions, transient blindness, depression, unusual weight change, abnormal bleeding, enlarged lymph nodes, and angioedema.     Objective:   Physical Exam     WD, Overweight, 64 y/o BF in NAD... GENERAL:  Alert & oriented; pleasant & cooperative...  HEENT:  [place]/AT, EOM-wnl, PERRLA, EACs-clear, TMs-wnl, NOSE-clear, THROAT-clear & wnl. NECK:  Supple w/ fairROM; no JVD; normal carotid impulses w/o bruits; no thyromegaly or nodules palpated; no lymphadenopathy. CHEST:  Sl decr BS bilat but clear to P & A; without wheezes/ rales/ or rhonchi heard... HEART:  Regular Rhythm; without murmurs/ rubs/ or gallops detected... ABDOMEN:  Obese, soft &  nontender; incr bowel sounds; no organomegaly or masses palpated... EXT: without deformities, mild arthritic changes; no varicose veins/ +venous insuffic/ no edema. NEURO:  CN's intact;  no focal neuro deficits... DERM:  No lesions noted; no rash etc...   Assessment & Plan:              ASTHMA>  Her chronic obstructive asthma is poorly controlled mostly due to medication compliance issues (not filling Advair etc);  We discussed importance of estab a good regimen she can effectively use every day; in this regard we will try Albut MDI- 2spQid, Atrovent- 2spQid (given from the [facility] clinic), & Flovent 220- 2spQid (could consider QVAR- whichever is cheaper for her);  Continue the allergy meds (the pollen is a prob for her) w/ Allegra, Singulair, Flonase...  Hopefully she will be able to avoid the freq trips to the clinic for problems & she is asked to call to let me know how she is doing vs return for f/u whenever able...  HBP>  Her meds have been adjusted by the [facility] clinic doctors as above & BP appears adeq controlled, continue Tenoretic, Norvasc, KCl...  AtypCP & Palpit>  These have remained under control on her current regimen, including Alprazolam;  She knows to avoid caffeine;  She had Cards eval in [place] but we don't have these records & she will continue the ASA & f/u in the [place] clinic...  GI>  She has GERD on Prilosec & Zantac w/ good control & not a lot of nocturnal asthma;  Also Divertics/ IBS & the Bentyl isn't covered, therefore try LEVSIN prn...  Stress Incont>  Improved w/ Oxybutynin form [facility] & she will maintain f/u w/ these doctors...  Anxiety>  We have refilled her Alprazolam;  Her clinic doctors have also written for Trazodone Qhs.Marland KitchenMarland Kitchen

## 2011-02-02 NOTE — Patient Instructions (Signed)
Today we updated your meds in our EPIC system...    For your breathing> use the Ventolin 2sp 4 times daily, Atrovent 2sp 4 times daily, & FLOVENT 2sp twice daily...    Continue your other meds the same...    We also wrote for Levsin to use up to 3 per day for abd discomfort/ cramping (in place if the Bentyl)...  Call & let us know if we can be of assistance to you in any way.Marland KitchenMarland Kitchen

## 2011-02-03 ENCOUNTER — Encounter: Payer: Self-pay | Admitting: Pulmonary Disease

## 2011-02-09 ENCOUNTER — Other Ambulatory Visit: Payer: Self-pay | Admitting: Pulmonary Disease

## 2011-03-01 NOTE — Assessment & Plan Note (Signed)
[facility]                            CARDIOLOGY OFFICE NOTE   Rachael Peck, Rachael Peck                    MRN:          [id number]  DATE:06/05/2008                            DOB:          1946/08/10    IDENTIFICATION:  Rachael Peck is a 65 year old who was referred for  evaluation of chest pain.   HISTORY OF PRESENT ILLNESS:  The patient is a little bit difficult  historian.  Note, she was seen recently in the [facility] Emergency  Room.  She said in April 21, 2008, she developed chest pressure.  When she  talks about it, says it was also sharp.  It started around 11 a.m.  She  took Tums and Zantac.  Episodes would last 10-15 minutes and then and go  away, come back, progressively increased in intensity, and she went to  the emergency room.  Bowels are moving okay.  She said she was seen,  told she needed to be reevaluated, and to rest.   An EKG and x-ray were done, but I do not have the results.  Since that  time, she has had few spells, but nothing like what she had and there is  no association with any particular activity.  Note, she was seen by Dr.  Kriste Basque on May 23, 2008 and referred here for further evaluation.   ALLERGIES:  Codeine.   Current medications include Advair Diskus, Allegra 180, Flonase daily,  aspirin 81, Cardizem LA 300, Klor-Con 20, Prilosec 20, Zantac 300,  Medrol 4 mg, atenolol and hydrochlorothiazide 100/25.   PAST MEDICAL HISTORY:  Asthmatic bronchitis, hypertension, GE reflux,  and anxiety.   SOCIAL HISTORY:  The patient lives with her family in [place], does not  smoke, does not drink alcohol.  Walks 2 times per week.   FAMILY HISTORY:  Maternal grandmother with CAD.  Her mother is alive.  Father deceased at age 83 with kidney disease and cancer.   REVIEW OF SYSTEMS:  History of allergies as noted with shortness of  breath.  Otherwise all systems reviewed, negative to the above problem  except as noted above.   PHYSICAL EXAMINATION:  GENERAL:  On exam, the patient is in no distress.  VITAL SIGNS:  Blood pressure is 155/82, pulse is 89 and regular, weight  was not taken.  HEENT:  Normocephalic and atraumatic.  EOMI.  PERRL.  Mucous membranes  moist.  NECK:  No thyromegaly.  No JVD.  No definite bruits.  LUNGS:  Clear.  No wheezes.  Moving air fairly well.  CARDIAC:  Regular rate and rhythm.  S1 and S2.  No S3.  No significant  murmurs.  ABDOMEN:  Benign.  EXTREMITIES:  No edema.   A 12-lead EKG shows normal sinus rhythm 87 beats per minute with first-  degree AV block.   IMPRESSION:  The patient is a 65 year old with history of chest pain.  Her pain is very typical.  She has cut back on her activity for concern  of this.  She is quite a difficult historian.  I think based on the  above, I would  recommend dobutamine Myoview to evaluate.  Continue on  her medicines for now.  Continue activities as tolerated.  Continue on  aspirin.   Need to check on fasting lipids.   Asthma.  Continue on pulmonary medications as noted.   I will be in touch with the patient regarding test results.     Pricilla Riffle, MD, [facility]  Electronically Signed    PVR/MedQ  DD: 06/06/2008  DT: 06/06/2008  Job #: [id number]   cc:   Lonzo Cloud. Kriste Basque, MD

## 2011-03-04 NOTE — Op Note (Signed)
   NAME:  Rachael Peck, Rachael Peck                         ACCOUNT NO.:  [id number]   MEDICAL RECORD NO.:  [id number]                   PATIENT TYPE:  AMB   LOCATION:  ENDO                                 FACILITY:  [facility]   PHYSICIAN:  James L. Malon Kindle., M.D.          DATE OF BIRTH:  July 06, 1946   DATE OF PROCEDURE:  04/17/2003  DATE OF DISCHARGE:                                 OPERATIVE REPORT   PROCEDURE:  Colonoscopy.   MEDICATIONS:  Fentanyl 50 mcg, Versed 10 mg IV.   ENDOSCOPE:  Olympus adjustable pediatric colonoscope.   INDICATIONS:  Colon cancer screening.   DESCRIPTION OF PROCEDURE:  The procedure had been explained and consent  obtained.  With the patient in the left lateral decubitus position, the  Olympus scope inserted and advanced.  The prep was excellent.  Using  abdominal pressure and position change, we were able to advance to the cecum  and the  ileocecal valve and appendiceal orifice were seen.  The scope was  withdrawn and the cecum, ascending colon, hepatic flexure, transverse colon,  splenic flexure, descending, and sigmoid colon were seen well.  Scattered  diverticula in the sigmoid colon and no evidence of polyps.  Scope  withdrawn.  The patient tolerated the procedure well.   ASSESSMENT:  1. No evidence of colon polyps.  2. Scattered diverticulosis.   PLAN:  Routine follow-up with yearly hemoccults.  Recommend considering  another procedure in five to 10 years.                                               James L. Malon Kindle., M.D.    Waldron Session  D:  04/17/2003  T:  04/17/2003  Job:  [id number]   cc:   Lonzo Cloud. Kriste Basque, M.D. [facility]

## 2011-03-21 ENCOUNTER — Other Ambulatory Visit: Payer: Self-pay | Admitting: Adult Health

## 2011-08-15 ENCOUNTER — Other Ambulatory Visit: Payer: Self-pay | Admitting: Adult Health

## 2011-08-22 ENCOUNTER — Telehealth: Payer: Self-pay | Admitting: Pulmonary Disease

## 2011-08-22 MED ORDER — POTASSIUM CHLORIDE ER 10 MEQ PO CPCR: ORAL_CAPSULE | ORAL | Status: DC

## 2011-08-22 NOTE — Telephone Encounter (Signed)
RX FOR pot was not enough called in to pharm. Called pharm and  Patient aware

## 2011-11-14 ENCOUNTER — Other Ambulatory Visit: Payer: Self-pay | Admitting: Adult Health

## 2011-12-27 ENCOUNTER — Ambulatory Visit: Payer: Medicare Other | Admitting: Adult Health

## 2012-01-12 ENCOUNTER — Ambulatory Visit (INDEPENDENT_AMBULATORY_CARE_PROVIDER_SITE_OTHER): Payer: Medicare HMO | Admitting: Adult Health

## 2012-01-12 ENCOUNTER — Encounter: Payer: Self-pay | Admitting: Adult Health

## 2012-01-12 VITALS — BP 142/84 | HR 88 | Temp 97.5°F | Wt 203.4 lb

## 2012-01-12 DIAGNOSIS — J449 Chronic obstructive pulmonary disease, unspecified: Secondary | ICD-10-CM

## 2012-01-12 MED ORDER — HYDROCODONE-HOMATROPINE 5-1.5 MG/5ML PO SYRP: 5.0000 mL | ORAL_SOLUTION | Freq: Four times a day (QID) | ORAL | Status: AC | PRN

## 2012-01-12 MED ORDER — CEFDINIR 300 MG PO CAPS: 300.0000 mg | ORAL_CAPSULE | Freq: Two times a day (BID) | ORAL | Status: AC

## 2012-01-12 MED ORDER — MECLIZINE HCL 25 MG PO TABS: 25.0000 mg | ORAL_TABLET | Freq: Three times a day (TID) | ORAL | Status: DC | PRN

## 2012-01-12 NOTE — Patient Instructions (Addendum)
Omnicef 300mg  Twice daily  For 7 days  Mucinex DM Twice daily  As needed  Cough/congestion  Hydromet 1-2 tsp every 4-6 hr As needed  Cough - may make you sleepy  Fluids and rest  Saline nasal rinses As needed   'Increase Advair 1 puff Twice daily   Please contact office for sooner follow up if symptoms do not improve or worsen or seek emergency care  Follow up with Dr. Kriste Basque  In 1 month as planned and As needed

## 2012-01-12 NOTE — Progress Notes (Signed)
Subjective:    Patient ID: Rachael Peck, female    DOB: November 22, 1945, 66 y.o.   MRN: [id number]  HPI  66 y/o BF  Hx of   AR;  Asthma;  HBP;  Hx atypCP & palpit;  Overweight;  GERD;  Divertics;  Stress incontinence;  Anxiety... She lives in [place] and she tells me she is followed for general medical purposes in the [facility] & was referred to [facility] for cardiac eval- we do not have any of these records- she denies recurrent CP, palpit, etc...   ~  December 01, 2009:  ROV w/ disability papers recently completed for her... incr stress w/ mother on hospice in [place] due to renal failure- they live together & Rachael Peck cares for her... asking for nerve pill & ALPRAZOLAM 0.25mg  written... she had a viral gastroenteritis 10/10, seen in the [facility] clinic & resolved... she reports breathing is stable;  BP controlled on meds;  had GYN check in [place] at [facility] but they didn't say anything about her leakage... due for f/u PFT's today> see below prob list...  ~  February 02, 2011:  14 month ROV & her social situation is the same- living in [place] & receives her general care via the [facility], Rachael Peck, she says they do her blood work as well & she does not want additional labs here today;  Her Asthma remains poorly controlled (she has "episodes every month & goes to the clinic that often), mostly due to medication compliance issues, & it appears that the Advair may not be covered but she is vague about this;  She was given Atrovent inhaler at the Clinic & she feels this helps> we discussed adjusting Rx for least expensive regimen: try Albuterol MDI- 2spQid, Atrovent- 2spQid, & Flovent 220- 2spBid;  We reviewed her current med list in detail & reconciled the list as below...  01/12/2012 Acute OV  Pt c/o sob, chest tightness, chest congestion, prod. cough at times, ear aches/pain 2 weeks  Does have some vertigo symptoms , wants refill of meclizine . No visual/speech changes. No arm  weakness. Was seen 4 months ago at urgent care , tx w/ abx . Says chest xray was neg.  Cough is keeping her up at night.  No hemotpysis or chest pain. No leg swelling.         Problem List:  ALLERGIC RHINITIS (ICD-477.9) - on ALLEGRA 180mg /d, & FLONASE Qhs...   CHRONIC OBSTRUCTIVE ASTHMA UNSPECIFIED (ICD-493.20) - on ADVAIR 250Bid (but not compliant),  PROAIR HFA Prn (using 2-3 times daily), ATROVENT 2spQid (started by the [facility] clinic in [place]), & MUCINEX 1-2Bid... ~  baseline CXR 11/08 showed sl hyperaeration, clear, NAD.Marland Kitchen. spondylitic changes in TSpine noted. ~  PFT 3/09 showed FVC= 3.36 (92%), FEV1= 2.13 (78%), %1sec= 63, mid-flows= 26% pred;  TLC= 6.61 (112%), RV= 3.25 (145%), RV/TLC= 49%;  DLCO= norm... ~  f/u CXR 8/10 showed min bibasilar atx, NAD... ~  PFT 2/11 showed FVC= 2.22 (73%), FEV1= 1.68 (70%), %1sec=76, mid-flows= 57%pred. ~  4/12:  88mo ROV here & she tells me she is getting her care thru the [facility] in [place]; just wants refills today- declines CXR, labs, etc; she hasn't been taking the Advair due to cost & we discussed regimen w/ ALBUTEROL 2spQid, ATROVENT 2spQid, FLOVENT 220- 2spBid, Singulair 10mg /d, Mucinex 2Bid, fluids, etc...  HYPERTENSION (ICD-401.9) - controlled on TENORETIC 100-25 daily,  KCl CAPSULES- taking 1 Bid,  & AMLODIPINE  5mg /d (from [employer])... ~  labs 8/10 showed K= 3.1, BUN= 10, Creat= 0.7... rec> incr K10 to 2Bid... ~  subseq labs all done in the [facility] clinic she says & they cut her KCl to 1Bid... ~  4/12:  BP today = 138/80 and doing well- denies HA, fatigue, visual changes, CP, palipit, dizziness, syncope, dyspnea, edema, etc...  CHEST PAIN, ATYPICAL (ICD-786.59) - on ASA 81mg /d... she denies recurrent CP, exertional CP, etc... ~  FLP 12/08 by Rachael Peck at [facility] showed TChol 130, TG 107, HDL 39, LDL 70... ~  pt notes she had 2DEcho at [facility] & will request copies of notes to [place] (never received)...  PALPITATIONS (ICD-785.1) - hx  palpitations in the past... no recent symptoms on meds above & she knows to avoid caffeine, etc...  OVERWEIGHT (ICD-278.02) - we reviewed diet + exercise. ~  weight 8/09 = 209# ~  weight 2/10 = 213# ~  weight 8/10 = 206# ~  weight 2/11 = 206# ~  Weight 4/12 = 207#  GERD (ICD-530.81) - on PRILOSEC 20mg Qam,  ZANTAC 300mg Qhs... she has a hx of GERD and LER controlled on these meds...   DIVERTICULOSIS OF COLON (ICD-562.10) - last colonoscopy 7/04 by Rachael Peck- showed divertics only... ~  4/12:  She has some classic IBS-like symptoms & we will try LEVSIN 0.125mg  Tid as needed...  STRESS INCONTINENCE (ICD-788.39) - she claims leakage of urine and stool- she has been recommended to f/u w/ GYN for this eval... ~  2/11:  she reports eval at [facility] in [place] 10/10 but they didn't address leakage... ~  4/12:  She reports that the [facility] clinic doctors have started Rx w/ OXYBUTYNIN 15mg /d for overactive bladder symptoms...  ANXIETY (ICD-300.00) - states that she is under stress due to her condition and having to care for her mother (on hospice w/ renal failure)... Rx for ALPRAZOLAM 0.25mg  Tid Prn written... ~  4/12:  She reports that [facility] clinic doctors have prescribed TRAZODONE 50mg  Qhs for sleep...   Past Surgical History  Procedure Date  . Abdominal hysterectomy     Outpatient Encounter Prescriptions as of 02/02/2011  Medication Sig Dispense Refill  . albuterol (VENTOLIN HFA) 108 (90 BASE) MCG/ACT inhaler Inhale 2 puffs into the lungs every 6 (six) hours as needed for wheezing. 1-2 puffs every 4-6 hrs as needed  1 Inhaler  11  . ALPRAZolam (XANAX) 0.25 MG tablet Take 1 tablet up to 3 times a day as needed for nerves  90 tablet  5  . amLODipine (NORVASC) 5 MG tablet Take 5 mg by mouth daily.        . ARTIFICIAL TEARS ophthalmic solution As needed for dry eyes       . aspirin (BAYER LOW STRENGTH) 81 MG EC tablet Take 81 mg by mouth daily.        Marland Kitchen atenolol-chlorthalidone (TENORETIC)  100-25 MG per tablet Take 1 tablet by mouth daily.        . fexofenadine (ALLEGRA) 180 MG tablet Once a day as needed for allergies       . fluticasone (FLONASE) 50 MCG/ACT nasal spray 2 sprays each nostril once a day       . ipratropium (ATROVENT HFA) 17 MCG/ACT inhaler Inhale 2 puffs into the lungs 4 (four) times daily.  1 Inhaler  11  . montelukast (SINGULAIR) 10 MG tablet Once a day       . omeprazole (PRILOSEC OTC) 20 MG tablet Take 20 mg  by mouth daily, 30 min before 1st meal...       . oxybutynin (DITROPAN XL) 15 MG 24 hr tablet Take 15 mg by mouth daily.        . potassium chloride (MICRO-K) 10 MEQ CR capsule 2 caps by mouth 2 times a day   ==> [facility] changed to 1 Bid per pt...    . ranitidine (ZANTAC) 300 MG tablet Take 1 tab by mouth at bedtime       . traZODone (DESYREL) 50 MG tablet Take 50 mg by mouth at bedtime. For sleep       . fluticasone (FLOVENT HFA) 220 MCG/ACT inhaler Inhale 2 puffs into the lungs 2 (two) times daily.  1 Inhaler  11        . hyoscyamine (LEVSIN) 0.125 MG tablet Take 1 tablet (0.125 mg total) by mouth 3 (three) times daily as needed for cramping.  90 tablet  11          No Known Allergies   Review of Systems Constitutional:   No  weight loss, night sweats,  Fevers, chills, + fatigue, or  lassitude.  HEENT:   No headaches,  Difficulty swallowing,  Tooth/dental problems, or  Sore throat,                No sneezing, itching,  +ear ache, nasal congestion, post nasal drip,   CV:  No chest pain,  Orthopnea, PND, swelling in lower extremities, anasarca, dizziness, palpitations, syncope.   GI  No heartburn, indigestion, abdominal pain, nausea, vomiting, diarrhea, change in bowel habits, loss of appetite, bloody stools.   Resp:   No coughing up of blood.     No chest wall deformity  Skin: no rash or lesions.  GU: no dysuria, change in color of urine, no urgency or frequency.  No flank pain, no hematuria   MS:  No joint pain or swelling.  No  decreased range of motion.  No back pain.  Psych:  No change in mood or affect. No depression or anxiety.  No memory loss.               Objective:   Physical Exam      WD, Overweight, 65 y/o BF in NAD... GENERAL:  Alert & oriented; pleasant & cooperative...  HEENT:  [place]/AT,   EACs-clear, TMs-wnl, NOSE-clear, THROAT-clear & wnl. NECK:  Supple w/ fairROM; no JVD; normal carotid impulses w/o bruits; no thyromegaly or nodules palpated; no lymphadenopathy. CHEST:  Sl decr BS bilat but clear to P & A; without wheezes/ rales/ or rhonchi heard... HEART:  Regular Rhythm; without murmurs/ rubs/ or gallops detected... ABDOMEN:  Obese, soft & nontender; incr bowel sounds; no organomegaly or masses palpated... EXT: without deformities, mild arthritic changes; no varicose veins/ +venous insuffic/ no edema. NEURO:    no focal neuro deficits... DERM:  No lesions noted; no rash etc...   Assessment & Plan:              ASTHMA>  Her chronic obstructive asthma is poorly controlled mostly due to medication compliance issues (not filling Advair etc);  We discussed importance of estab a good regimen she can effectively use every day; in this regard we will try Albut MDI- 2spQid, Atrovent- 2spQid (given from the [facility] clinic), & Flovent 220- 2spQid (could consider QVAR- whichever is cheaper for her);  Continue the allergy meds (the pollen is a prob for her) w/ Allegra, Singulair, Flonase...  Hopefully she will be able  to avoid the freq trips to the clinic for problems & she is asked to call to let me know how she is doing vs return for f/u whenever able...  HBP>  Her meds have been adjusted by the [facility] clinic doctors as above & BP appears adeq controlled, continue Tenoretic, Norvasc, KCl...  AtypCP & Palpit>  These have remained under control on her current regimen, including Alprazolam;  She knows to avoid caffeine;  She had Cards eval in [place] but we don't have these records & she will continue the  ASA & f/u in the [place] clinic...  GI>  She has GERD on Prilosec & Zantac w/ good control & not a lot of nocturnal asthma;  Also Divertics/ IBS & the Bentyl isn't covered, therefore try LEVSIN prn...  Stress Incont>  Improved w/ Oxybutynin form [facility] & she will maintain f/u w/ these doctors...  Anxiety>  We have refilled her Alprazolam;  Her clinic doctors have also written for Trazodone Qhs.Marland KitchenMarland Kitchen

## 2012-01-12 NOTE — Assessment & Plan Note (Signed)
Flare with Bronchitis   Plan;  Omnicef 300mg  Twice daily  For 7 days  Mucinex DM Twice daily  As needed  Cough/congestion  Hydromet 1-2 tsp every 4-6 hr As needed  Cough - may make you sleepy  Fluids and rest  Saline nasal rinses As needed   'Increase Advair 1 puff Twice daily   Please contact office for sooner follow up if symptoms do not improve or worsen or seek emergency care  Follow up with Dr. Kriste Basque  In 1 month as planned and As needed

## 2012-02-07 ENCOUNTER — Ambulatory Visit (INDEPENDENT_AMBULATORY_CARE_PROVIDER_SITE_OTHER): Payer: Medicare HMO | Admitting: Pulmonary Disease

## 2012-02-07 ENCOUNTER — Encounter: Payer: Self-pay | Admitting: Pulmonary Disease

## 2012-02-07 VITALS — BP 128/82 | HR 105 | Temp 97.4°F | Ht 68.0 in | Wt 204.0 lb

## 2012-02-07 DIAGNOSIS — J449 Chronic obstructive pulmonary disease, unspecified: Secondary | ICD-10-CM

## 2012-02-07 DIAGNOSIS — F411 Generalized anxiety disorder: Secondary | ICD-10-CM

## 2012-02-07 DIAGNOSIS — J4489 Other specified chronic obstructive pulmonary disease: Secondary | ICD-10-CM

## 2012-02-07 DIAGNOSIS — J309 Allergic rhinitis, unspecified: Secondary | ICD-10-CM

## 2012-02-07 DIAGNOSIS — K219 Gastro-esophageal reflux disease without esophagitis: Secondary | ICD-10-CM

## 2012-02-07 DIAGNOSIS — I10 Essential (primary) hypertension: Secondary | ICD-10-CM

## 2012-02-07 DIAGNOSIS — N39498 Other specified urinary incontinence: Secondary | ICD-10-CM

## 2012-02-07 MED ORDER — FLUTICASONE-SALMETEROL 250-50 MCG/DOSE IN AEPB: 1.0000 | INHALATION_SPRAY | Freq: Two times a day (BID) | RESPIRATORY_TRACT | Status: DC

## 2012-02-07 MED ORDER — MONTELUKAST SODIUM 10 MG PO TABS: 10.0000 mg | ORAL_TABLET | Freq: Every day | ORAL | Status: DC

## 2012-02-07 MED ORDER — ALBUTEROL SULFATE (2.5 MG/3ML) 0.083% IN NEBU: 2.5000 mg | INHALATION_SOLUTION | Freq: Three times a day (TID) | RESPIRATORY_TRACT | Status: DC | PRN

## 2012-02-07 MED ORDER — ALBUTEROL SULFATE HFA 108 (90 BASE) MCG/ACT IN AERS: 2.0000 | INHALATION_SPRAY | Freq: Four times a day (QID) | RESPIRATORY_TRACT | Status: DC | PRN

## 2012-02-07 MED ORDER — FLUTICASONE PROPIONATE 50 MCG/ACT NA SUSP: 1.0000 | Freq: Every day | NASAL | Status: DC

## 2012-02-07 MED ORDER — IPRATROPIUM BROMIDE 0.02 % IN SOLN: 500.0000 ug | Freq: Three times a day (TID) | RESPIRATORY_TRACT | Status: DC

## 2012-02-07 NOTE — Patient Instructions (Addendum)
Today we updated your med list in our EPIC system...    For your nasal passages:    Use the antihistamine (Claritin, Zyrtek, or Allegra) in the AM...    Spray a Nasal Saline in your nostrils every 1-2H during the day...    Spray the FLONASE- 2sp in each nostril at bedtime...  Remember to get a good air cleaner for your bedroom to continually filter the air...  For your asthma:     Use the NEBULIZER 2-3 times daily w/ the mixture of ALBUTEROL & IPRATROPIUM...    Alternate that w/ your ADVAIR- one inhalation twice daily...    Continue the SINGULAIR one tab per day    And use the [facility] as a rescue inhaler that you carry w/ you during the day...  Try to avoid pollen exposure & for sure avoid infections...  Call for any questions...  Let's plan a follow up visit in 6 months.Marland KitchenMarland Kitchen

## 2012-02-07 NOTE — Progress Notes (Addendum)
Subjective:    Patient ID: Rachael Peck, female    DOB: 05-Feb-1946, 66 y.o.   MRN: [id number]  HPI 66 y/o BF here for a follow up visit... she has multiple medical problems including:  AR;  Asthma;  HBP;  Hx atypCP & palpit;  Overweight;  GERD;  Divertics;  Stress incontinence;  Anxiety... She lives in [place] and she tells me she is followed for general medical purposes in the [facility] & was referred to [facility] for cardiac eval- we do not have any of these records- she denies recurrent CP, palpit, etc...   ~  December 01, 2009:  ROV w/ disability papers recently completed for her... incr stress w/ mother on hospice in [place] due to renal failure- they live together & Bethannie cares for her... asking for nerve pill & ALPRAZOLAM 0.25mg  written... she had a viral gastroenteritis 10/10, seen in the [facility] clinic & resolved... she reports breathing is stable;  BP controlled on meds;  had GYN check in [place] at [facility] but they didn't say anything about her leakage... due for f/u PFT's today> see below prob list...  ~  February 02, 2011:  14 month ROV & her social situation is the same- living in [place] & receives her general care via the [facility], DrCopper, she says they do her blood work as well & she does not want additional labs here today;  Her Asthma remains poorly controlled (she has "episodes every month & goes to the clinic that often), mostly due to medication compliance issues, & it appears that the Advair may not be covered but she is vague about this;  She was given Atrovent inhaler at the Clinic & she feels this helps> we discussed adjusting Rx for least expensive regimen: try Albuterol MDI- 2spQid, Atrovent- 2spQid, & Flovent 220- 2spBid;  We reviewed her current med list in detail & reconciled the list as below...  ~  February 07, 2012:  Yearly ROV & Jasmene recently saw TP w/ Bronchitis & treated w/ Omnicef, Mucinex, Hydromet;  She tells me that she continues to  get her primary care from the [facility];  Her CC today is allergy problems from the pollen & we discussed OTC antihist & nasal saline, etc...     We reviewed prob list, meds, xrays and labs> see below for updates >>   Problem List:     << PROBLEM LIST UPDATED 02/07/12 >>  ALLERGIC RHINITIS (ICD-477.9) - on ALLEGRA 180mg /d, & FLONASE Qhs...   CHRONIC OBSTRUCTIVE ASTHMA UNSPECIFIED (ICD-493.20) - on ADVAIR 250Bid (but not compliant),  PROAIR HFA Prn (using 2-3 times daily), ATROVENT 2spQid (started by the [facility] clinic in [place]), & MUCINEX 1-2Bid... ~  baseline CXR 11/08 showed sl hyperaeration, clear, NAD.Marland Kitchen. spondylitic changes in TSpine noted. ~  PFT 3/09 showed FVC= 3.36 (92%), FEV1= 2.13 (78%), %1sec= 63, mid-flows= 26% pred;  TLC= 6.61 (112%), RV= 3.25 (145%), RV/TLC= 49%;  DLCO= norm... ~  f/u CXR 8/10 showed min bibasilar atx, NAD... ~  PFT 2/11 showed FVC= 2.22 (73%), FEV1= 1.68 (70%), %1sec=76, mid-flows= 57%pred. ~  4/12:  72mo ROV here & she tells me she is getting her care thru the [facility] in [place]; just wants refills today- declines CXR, labs, etc; she hasn't been taking the Advair due to cost & we discussed regimen w/ ALBUTEROL 2spQid, ATROVENT 2spQid, FLOVENT 220- 2spBid, Singulair 10mg /d, Mucinex 2Bid, fluids, etc... ~  4/13:  Yearly ROV &  she remains stable, same meds 7 followed in [place] as noted...  HYPERTENSION (ICD-401.9) - controlled on TENORETIC 100-25 daily,  KCl CAPSULES- taking 1 Bid,  & AMLODIPINE 5mg /d (from [employer])... ~  labs 8/10 showed K= 3.1, BUN= 10, Creat= 0.7... rec> incr K10 to 2Bid... ~  subseq labs all done in the [facility] clinic she says & they cut her KCl to 1Bid... ~  4/12:  BP today = 138/80 and doing well- denies HA, fatigue, visual changes, CP, palipit, dizziness, syncope, dyspnea, edema, etc... ~  4/13:  BP= 128/82 & she denies CP, palpit, dizzy, SOB, edema, etc...  CHEST PAIN, ATYPICAL (ICD-786.59) - on ASA 81mg /d... she denies  recurrent CP, exertional CP, etc... ~  FLP 12/08 by [facility]yas at [facility] showed TChol 130, TG 107, HDL 39, LDL 70... ~  pt notes she had 2DEcho at [facility] & will request copies of notes to [place] (never received)...  PALPITATIONS (ICD-785.1) - hx palpitations in the past... no recent symptoms on meds above & she knows to avoid caffeine, etc...  OVERWEIGHT (ICD-278.02) - we reviewed diet + exercise. ~  weight 8/09 = 209# ~  weight 2/10 = 213# ~  weight 8/10 = 206# ~  weight 2/11 = 206# ~  Weight 4/12 = 207# ~  Weight 4/13 = 204#  GERD (ICD-530.81) - on PRILOSEC 20mg Qam,  ZANTAC 300mg Qhs... she has a hx of GERD and LER controlled on these meds...   DIVERTICULOSIS OF COLON (ICD-562.10) - last colonoscopy 7/04 by DrJEdwards- showed divertics only... ~  4/12:  She has some classic IBS-like symptoms & we will try LEVSIN 0.125mg  Tid as needed...  STRESS INCONTINENCE (ICD-788.39) - she claims leakage of urine and stool- she has been recommended to f/u w/ GYN for this eval... ~  2/11:  she reports eval at [facility] in [place] 10/10 but they didn't address leakage... ~  4/12:  She reports that the [facility] clinic doctors have started Rx w/ OXYBUTYNIN 15mg /d for overactive bladder symptoms...  ANXIETY (ICD-300.00) - states that she is under stress due to her condition and having to care for her mother (on hospice w/ renal failure)... Rx for ALPRAZOLAM 0.25mg  Tid Prn written... ~  4/12:  She reports that [facility] clinic doctors have prescribed TRAZODONE 50mg  Qhs for sleep... ~  4/13:  She notes that everything is stable> on Trazodone & Xanax...   Past Surgical History  Procedure Date  . Abdominal hysterectomy     Outpatient Encounter Prescriptions as of 02/07/2012  Medication Sig Dispense Refill  . albuterol (PROVENTIL) (2.5 MG/3ML) 0.083% nebulizer solution Take 3 mLs (2.5 mg total) by nebulization 3 (three) times daily as needed.  270 mL  11  . albuterol (VENTOLIN HFA) 108 (90 BASE) MCG/ACT inhaler Inhale 2  puffs into the lungs every 6 (six) hours as needed for wheezing. 1-2 puffs every 4-6 hrs as needed  1 Inhaler  11  . ALPRAZolam (XANAX) 0.25 MG tablet Take 2 tablets (0.5 mg total) by mouth 3 (three) times daily as needed for sleep. 1 tablet up to 3 times a day as needed for nerves  90 tablet  5  . amLODipine (NORVASC) 5 MG tablet Take 5 mg by mouth daily.        . ARTIFICIAL TEARS ophthalmic solution As needed for dry eyes       . aspirin (BAYER LOW STRENGTH) 81 MG EC tablet Take 81 mg by mouth daily.        Marland Kitchen  atenolol-chlorthalidone (TENORETIC) 100-25 MG per tablet Take 1 tablet by mouth daily.        Marland Kitchen diltiazem (CARDIZEM SR) 90 MG 12 hr capsule 1 tablet 3 times a day       . fexofenadine (ALLEGRA) 180 MG tablet Once a day as needed for allergies       . fluticasone (FLONASE) 50 MCG/ACT nasal spray Place 1 spray into the nose daily. 2 sprays each nostril once a day  16 g  11  . Fluticasone-Salmeterol (ADVAIR DISKUS) 250-50 MCG/DOSE AEPB Inhale 1 puff into the lungs every 12 (twelve) hours.  60 each  11  . montelukast (SINGULAIR) 10 MG tablet Take 1 tablet (10 mg total) by mouth at bedtime. Once a day  30 tablet  11  . omeprazole (PRILOSEC OTC) 20 MG tablet Take 20 mg by mouth daily.        Marland Kitchen oxybutynin (DITROPAN XL) 15 MG 24 hr tablet Take 15 mg by mouth daily.        . ranitidine (ZANTAC) 300 MG tablet TAKE ONE TABLET BY MOUTH EVERY DAY AT BEDTIME  30 tablet  11  . tobramycin-dexamethasone (TOBRADEX) ophthalmic solution 2 drops in both eyes 3 times a day       . topiramate (TOPAMAX) 50 MG tablet Take 1 tablet by mouth At bedtime.      . traZODone (DESYREL) 50 MG tablet Take 50 mg by mouth at bedtime. For sleep       . DISCONTD: albuterol (PROVENTIL) (2.5 MG/3ML) 0.083% nebulizer solution Take 2.5 mg by nebulization every 6 (six) hours as needed.      Marland Kitchen DISCONTD: albuterol (VENTOLIN HFA) 108 (90 BASE) MCG/ACT inhaler Inhale 2 puffs into the lungs every 6 (six) hours as needed for wheezing. 1-2  puffs every 4-6 hrs as needed  1 Inhaler  11  . DISCONTD: fluticasone (FLONASE) 50 MCG/ACT nasal spray 2 sprays each nostril once a day       . DISCONTD: Fluticasone-Salmeterol (ADVAIR DISKUS) 250-50 MCG/DOSE AEPB Inhale 1 puff into the lungs every 12 (twelve) hours.        Marland Kitchen DISCONTD: meclizine (ANTIVERT) 25 MG tablet Take 1 tablet (25 mg total) by mouth 3 (three) times daily as needed for dizziness or nausea.  30 tablet  1  . DISCONTD: montelukast (SINGULAIR) 10 MG tablet Once a day       . DISCONTD: potassium chloride (MICRO-K) 10 MEQ CR capsule Take 2 tablets twice a day.  120 capsule  3  . dicyclomine (BENTYL) 20 MG tablet 1 tablet 3 times a day as needed for abdominal cramping       . fluticasone (FLOVENT HFA) 220 MCG/ACT inhaler Inhale 2 puffs into the lungs 2 (two) times daily.  1 Inhaler  11  . ipratropium (ATROVENT) 0.02 % nebulizer solution Take 2.5 mLs (500 mcg total) by nebulization 3 (three) times daily.  270 mL  11  . DISCONTD: ipratropium (ATROVENT HFA) 17 MCG/ACT inhaler Inhale 2 puffs into the lungs 4 (four) times daily.  1 Inhaler  11    No Known Allergies   Review of Systems         See HPI - all other systems neg except as noted... The patient complains of dyspnea on exertion, incontinence, and difficulty walking.  The patient denies anorexia, fever, weight loss, weight gain, vision loss, decreased hearing, hoarseness, chest pain, syncope, peripheral edema, prolonged cough, headaches, hemoptysis, abdominal pain, melena, hematochezia, severe indigestion/heartburn, hematuria, muscle weakness,  suspicious skin lesions, transient blindness, depression, unusual weight change, abnormal bleeding, enlarged lymph nodes, and angioedema.     Objective:   Physical Exam     WD, Overweight, 65 y/o BF in NAD... GENERAL:  Alert & oriented; pleasant & cooperative...  HEENT:  [place]/AT, EOM-wnl, PERRLA, EACs-clear, TMs-wnl, NOSE-clear, THROAT-clear & wnl. NECK:  Supple w/ fairROM; no JVD;  normal carotid impulses w/o bruits; no thyromegaly or nodules palpated; no lymphadenopathy. CHEST:  Sl decr BS bilat but clear to P & A; without wheezes/ rales/ or rhonchi heard... HEART:  Regular Rhythm; without murmurs/ rubs/ or gallops detected... ABDOMEN:  Obese, soft & nontender; incr bowel sounds; no organomegaly or masses palpated... EXT: without deformities, mild arthritic changes; no varicose veins/ +venous insuffic/ no edema. NEURO:  CN's intact;  no focal neuro deficits... DERM:  No lesions noted; no rash etc...  RADIOLOGY DATA:  Reviewed in the EPIC EMR & discussed w/ the patient...  LABORATORY DATA:  Reviewed in the EPIC EMR & discussed w/ the patient...   Assessment & Plan:              ASTHMA>  Her chronic obstructive asthma is poorly controlled mostly due to medication compliance issues (not filling Advair etc);  We discussed importance of estab a good regimen she can effectively use every day; in this regard we will try Albut MDI- 2spQid, Atrovent- 2spQid (given from the [facility] clinic), & Flovent 220- 2spQid (could consider QVAR- whichever is cheaper for her);  Continue the allergy meds (the pollen is a prob for her) w/ Allegra, Singulair, Flonase...  Hopefully she will be able to avoid the freq trips to the clinic for problems & she is asked to call to let me know how she is doing vs return for f/u whenever able...  HBP>  Her meds have been adjusted by the [facility] clinic doctors as above & BP appears adeq controlled, continue Tenoretic, Norvasc, KCl...  AtypCP & Palpit>  These have remained under control on her current regimen, including Alprazolam;  She knows to avoid caffeine;  She had Cards eval in [place] but we don't have these records & she will continue the ASA & f/u in the [place] clinic...  GI>  She has GERD on Prilosec & Zantac w/ good control & not a lot of nocturnal asthma;  Also Divertics/ IBS & the Bentyl isn't covered, therefore try LEVSIN prn...  Stress Incont>   Improved w/ Oxybutynin form [facility] & she will maintain f/u w/ these doctors...  Anxiety>  We have refilled her Alprazolam;  Her clinic doctors have also written for Trazodone Qhs.Marland KitchenMarland Kitchen

## 2012-03-12 ENCOUNTER — Other Ambulatory Visit: Payer: Self-pay | Admitting: Adult Health

## 2012-03-13 ENCOUNTER — Telehealth: Payer: Self-pay | Admitting: Adult Health

## 2012-03-13 MED ORDER — POTASSIUM CHLORIDE ER 10 MEQ PO CPCR: ORAL_CAPSULE | ORAL | Status: DC

## 2012-03-13 NOTE — Telephone Encounter (Signed)
rx for the potassium has been sent to the pts pharmacy.  Pt voiced her understanding of this and nothing further needed.

## 2012-03-20 ENCOUNTER — Other Ambulatory Visit: Payer: Self-pay | Admitting: *Deleted

## 2012-03-20 MED ORDER — MECLIZINE HCL 25 MG PO TABS: 25.0000 mg | ORAL_TABLET | Freq: Three times a day (TID) | ORAL | Status: DC | PRN

## 2012-05-16 ENCOUNTER — Other Ambulatory Visit: Payer: Self-pay | Admitting: Pulmonary Disease

## 2012-05-30 ENCOUNTER — Other Ambulatory Visit: Payer: Self-pay | Admitting: Pulmonary Disease

## 2012-05-30 MED ORDER — HYOSCYAMINE SULFATE 0.125 MG PO TABS: 0.1250 mg | ORAL_TABLET | Freq: Three times a day (TID) | ORAL | Status: DC | PRN

## 2012-08-03 ENCOUNTER — Encounter: Payer: Self-pay | Admitting: *Deleted

## 2012-08-06 ENCOUNTER — Ambulatory Visit (INDEPENDENT_AMBULATORY_CARE_PROVIDER_SITE_OTHER): Payer: Medicare HMO | Admitting: Pulmonary Disease

## 2012-08-06 ENCOUNTER — Encounter: Payer: Self-pay | Admitting: Pulmonary Disease

## 2012-08-06 VITALS — BP 142/74 | HR 83 | Temp 98.6°F | Ht 68.0 in | Wt 202.4 lb

## 2012-08-06 DIAGNOSIS — J4489 Other specified chronic obstructive pulmonary disease: Secondary | ICD-10-CM

## 2012-08-06 DIAGNOSIS — K219 Gastro-esophageal reflux disease without esophagitis: Secondary | ICD-10-CM

## 2012-08-06 DIAGNOSIS — N39498 Other specified urinary incontinence: Secondary | ICD-10-CM

## 2012-08-06 DIAGNOSIS — I10 Essential (primary) hypertension: Secondary | ICD-10-CM

## 2012-08-06 DIAGNOSIS — G459 Transient cerebral ischemic attack, unspecified: Secondary | ICD-10-CM

## 2012-08-06 DIAGNOSIS — F411 Generalized anxiety disorder: Secondary | ICD-10-CM

## 2012-08-06 DIAGNOSIS — E663 Overweight: Secondary | ICD-10-CM

## 2012-08-06 DIAGNOSIS — K5730 Diverticulosis of large intestine without perforation or abscess without bleeding: Secondary | ICD-10-CM

## 2012-08-06 DIAGNOSIS — J449 Chronic obstructive pulmonary disease, unspecified: Secondary | ICD-10-CM

## 2012-08-06 MED ORDER — ALPRAZOLAM 0.25 MG PO TABS: 0.5000 mg | ORAL_TABLET | Freq: Three times a day (TID) | ORAL | Status: DC | PRN

## 2012-08-06 MED ORDER — HYDROCODONE-HOMATROPINE 5-1.5 MG/5ML PO SYRP: 5.0000 mL | ORAL_SOLUTION | Freq: Four times a day (QID) | ORAL | Status: DC | PRN

## 2012-08-06 NOTE — Patient Instructions (Addendum)
Today we updated your med list in our EPIC system...    Continue your current medications the same...    We tried to reconcile our med list to the changes made at [facility]...    We will call for your records so we can get an update on your condition, Labs, Xrays, etc...  For the congestion>    Add the OTC MUCINEX 600mg  tabs> 2 tabs twice daily w/ lots of water...    You may also use the DELSYM OTC cough syrup> 2 tsp twice daily as needed...    We wrote a new prescription for HYCODAN cough syrup> one tsp every 6H as needed...  Call for any questions...  Let's continue our 6 month follow up visits, but call sooner if needed for problems.Marland KitchenMarland Kitchen

## 2012-08-06 NOTE — Progress Notes (Signed)
Subjective:    Patient ID: Rachael Peck, female    DOB: 06-07-1946, 66 y.o.   MRN: [id number]  HPI 66 y/o BF here for a follow up visit... she has multiple medical problems including:  AR;  Asthma;  HBP;  Hx atypCP & palpit;  Overweight;  GERD;  Divertics;  Stress incontinence;  Anxiety... She [place] in [place] and she tells me she is followed for general medical purposes in the [facility] & was referred to [facility] for cardiac eval- we do not have any of these records- she denies recurrent CP, palpit, etc...   ~  December 01, 2009:  ROV w/ disability papers recently completed for her... incr stress w/ mother on hospice in [place] due to renal failure- they live together & Montoya cares for her... asking for nerve pill & ALPRAZOLAM 0.25mg  written... she had a viral gastroenteritis 10/10, seen in the [facility] clinic & resolved... she reports breathing is stable;  BP controlled on meds;  had GYN check in [place] at [facility] but they didn't say anything about her leakage... due for f/u PFT's today> see below prob list...  ~  February 02, 2011:  14 month ROV & her social situation is the same- living in [place] & receives her general care via the [facility], DrCopper, she says they do her blood work as well & she does not want additional labs here today;  Her Asthma remains poorly controlled (she has "episodes every month & goes to the clinic that often), mostly due to medication compliance issues, & it appears that the Advair may not be covered but she is vague about this;  She was given Atrovent inhaler at the Clinic & she feels this helps> we discussed adjusting Rx for least expensive regimen: try Albuterol MDI- 2spQid, Atrovent- 2spQid, & Flovent 220- 2spBid;  We reviewed her current med list in detail & reconciled the list as below...  ~  February 07, 2012:  Yearly ROV & Latacha recently saw TP w/ Bronchitis & treated w/ Omnicef, Mucinex, Hydromet;  She tells me that she continues to  get her primary care from the [facility];  Her CC today is allergy problems from the pollen & we discussed OTC antihist & nasal saline, etc...     We reviewed prob list, meds, xrays and labs> see below for updates >>  ~  August 06, 2012:  3mo ROV & Tariana notes "this has been one of my best yrs" but states that her Asthma has "kicked up" lately- c/o cough, clear drainage, & some SOB; Rec to add Mucinex-2Bid + Fluids (see below)...  She was seen in the [facility] ER 6/13 w/ a TIA, followed up w/ her [facility] LMD & referred to [facility] Neu[facility] (placed on ASA/ Plavix) & [facility] (monitor placed looking for AFib)...    AR/ Asthma> on Allegra/ Flonase, NEBS w/ Albut/Ipratrop Qid, [id number], Singulair10; ?gets meds in [place] clinic? Asked to add Mucinex-2Bid + fluids...    HBP> on Aten100, Hct12.5, Amlod5-2tabs/d, & K10-2Bid; BP= 142/74 & she denies CP, palpit, ch in SOB, edema...    Overweight> "Im watching my diet" and weight = 202# (down 2#) & BMI=31; we reviewed diet & exercise strategies...    GI> GERD, Divertics> on Prilosec20, Zantac300Qhs, & Levsin prn; denies abd pain, n/v, c/d, blood seen...    GU> Stress Incont> on Oxybut15mg /d; she states voiding is better & leaking less...    TIA> seen in ER  at [facility] 6/13- f/u w/ Neuro & Cards at [facility]; we have requested records...    Anxiety> on Trazodone50Qhs, Alprazolam0.25mg  prn; meds refilled per request... We reviewed prob list, meds, xrays and labs> see below for updates >> she had the 2013 Flu vaccine 9/13; she requested refill Rxs from [place]- ok... Only labs/ XRays here were in 2010 as she assures me that XRays, EKGs, LABS all done at [facility] (we have requested records)...         Problem List:     ALLERGIC RHINITIS (ICD-477.9) - on ALLEGRA 180mg /d, & FLONASE Qhs...   CHRONIC OBSTRUCTIVE ASTHMA UNSPECIFIED (ICD-493.20) - on ADVAIR 250Bid (but not compliant),  PROAIR HFA Prn (using 2-3 times daily), ATROVENT 2spQid (started by the  [facility] clinic in [place]), & MUCINEX 1-2Bid... ~  baseline CXR 11/08 showed sl hyperaeration, clear, NAD.Marland Kitchen. spondylitic changes in TSpine noted. ~  PFT 3/09 showed FVC= 3.36 (92%), FEV1= 2.13 (78%), %1sec= 63, mid-flows= 26% pred;  TLC= 6.61 (112%), RV= 3.25 (145%), RV/TLC= 49%;  DLCO= norm... ~  f/u CXR 8/10 showed min bibasilar atx, NAD... ~  PFT 2/11 showed FVC= 2.22 (73%), FEV1= 1.68 (70%), %1sec=76, mid-flows= 57%pred. ~  4/12:  64mo ROV here & she tells me she is getting her care thru the [facility] in [place]; just wants refills today- declines CXR, labs, etc; she hasn't been taking the Advair due to cost & we discussed regimen w/ ALBUTEROL 2spQid, ATROVENT 2spQid, FLOVENT 220- 2spBid, Singulair 10mg /d, Mucinex 2Bid, fluids, etc... ~  4/13:  Yearly ROV & she remains stable, same meds & followed in [place] as noted... ~  10/13:  Mild exac treated w/ her NEBS Qid, Advair250, Mucinex2Bid, Hycodan cough syrup...  HYPERTENSION (ICD-401.9) - controlled on ATENOLOL100, HCTZ12.5,  KCl 10-2Bid,  & AMLODIPINE now 10mg /d (from [employer])... ~  labs 8/10 showed K= 3.1, BUN= 10, Creat= 0.7... rec> incr K10 to 2Bid... ~  subseq labs all done in the [facility] clinic she says & they cut her KCl to 1Bid... ~  4/12:  BP today = 138/80 and doing well- denies HA, fatigue, visual changes, CP, palipit, dizziness, syncope, dyspnea, edema, etc... ~  4/13:  BP= 128/82 & she denies CP, palpit, dizzy, SOB, edema, etc... ~  10/13:  BP= 142/74 & she denies CP, palpit, ch in SOB, edema...  CHEST PAIN, ATYPICAL (ICD-786.59) - on ASA 81mg /d... she denies recurrent CP, exertional CP, etc... ~  FLP 12/08 by DrVyas at [facility] showed TChol 130, TG 107, HDL 39, LDL 70... ~  pt notes she had 2[facility]cho at [facility] & will request copies of notes to [place] (never received)...  PALPITATIONS (ICD-785.1) - hx palpitations in the past... no recent symptoms on meds above & she knows to avoid caffeine, etc...  OVERWEIGHT (ICD-278.02) - we reviewed diet  + exercise. ~  weight 8/09 = 209# ~  weight 2/10 = 213# ~  weight 8/10 = 206# ~  weight 2/11 = 206# ~  Weight 4/12 = 207# ~  Weight 4/13 = 204# ~  Weight 10/13 = 202#  GERD (ICD-530.81) - on PRILOSEC 20mg Qam,  ZANTAC 300mg Qhs... she has a hx of GERD and LER controlled on these meds...   DIVERTICULOSIS OF COLON (ICD-562.10) - last colonoscopy 7/04 by DrJEdwards- showed divertics only... ~  4/12:  She has some classic IBS-like symptoms & we will try LEVSIN 0.125mg  Tid as needed...  STRESS INCONTINENCE (ICD-788.39) - she claims leakage of urine and stool- she has  been recommended to f/u w/ GYN for this eval... ~  2/11:  she reports eval at [facility] in [place] 10/10 but they didn't address leakage... ~  4/12:  She reports that the [facility] clinic doctors have started Rx w/ OXYBUTYNIN 15mg /d for overactive bladder symptoms...  TIA >> on ASA 81mg /d & PLAVIX 75mg /d... ~  6/13: she was seen at the [facility] ER w/ TIA (records pending)... ~  She was referred to her LMD at the [facility], then sent to [facility] & [facility]- we have requested records...  ANXIETY (ICD-300.00) - states that she is under stress due to her condition and having to care for her mother (on hospice w/ renal failure)... Rx for ALPRAZOLAM 0.25mg  Tid Prn written... ~  4/12:  She reports that [facility] clinic doctors have prescribed TRAZODONE 50mg  Qhs for sleep... ~  4/13:  She notes that everything is stable> on Trazodone & Xanax...   Past Surgical History  Procedure Date  . Abdominal hysterectomy     Outpatient Encounter Prescriptions as of 08/06/2012  Medication Sig Dispense Refill  . albuterol (PROVENTIL) (2.5 MG/3ML) 0.083% nebulizer solution Take 3 mLs (2.5 mg total) by nebulization 3 (three) times daily as needed.  270 mL  11  . albuterol (VENTOLIN HFA) 108 (90 BASE) MCG/ACT inhaler Inhale 2 puffs into the lungs every 6 (six) hours as needed for wheezing. 1-2 puffs every 4-6 hrs as needed  1 Inhaler  11  .  ALPRAZolam (XANAX) 0.25 MG tablet Take 2 tablets (0.5 mg total) by mouth 3 (three) times daily as needed for sleep. 1 tablet up to 3 times a day as needed for nerves  90 tablet  5  . amLODipine (NORVASC) 10 MG tablet Take 2 tablets by mouth daily      . ARTIFICIAL TEARS ophthalmic solution As needed for dry eyes       . aspirin (BAYER LOW STRENGTH) 81 MG EC tablet Take 81 mg by mouth daily.        Marland Kitchen atenolol (TENORMIN) 100 MG tablet Take 100 mg by mouth daily.      . clopidogrel (PLAVIX) 75 MG tablet Take 75 mg by mouth daily.      Marland Kitchen diltiazem (CARDIZEM SR) 90 MG 12 hr capsule 1 tablet 3 times a day       . fexofenadine (ALLEGRA) 180 MG tablet Once a day as needed for allergies       . fluticasone (FLONASE) 50 MCG/ACT nasal spray Place 1 spray into the nose daily. 2 sprays each nostril once a day  16 g  11  . fluticasone (FLOVENT HFA) 220 MCG/ACT inhaler Inhale 2 puffs into the lungs 2 (two) times daily.  1 Inhaler  11  . Fluticasone-Salmeterol (ADVAIR DISKUS) 250-50 MCG/DOSE AEPB Inhale 1 puff into the lungs every 12 (twelve) hours.  60 each  11  . hydrochlorothiazide (HYDRODIURIL) 12.5 MG tablet Take 12.5 mg by mouth daily.      . hyoscyamine (LEVSIN) 0.125 MG tablet Take 1 tablet (0.125 mg total) by mouth 3 (three) times daily as needed for cramping.  90 tablet  11  . ipratropium (ATROVENT) 0.02 % nebulizer solution Take 2.5 mLs (500 mcg total) by nebulization 3 (three) times daily.  270 mL  11  . meclizine (ANTIVERT) 25 MG tablet Take 1 tablet (25 mg total) by mouth 3 (three) times daily as needed for dizziness or nausea.  90 tablet  1  . montelukast (SINGULAIR) 10 MG  tablet Take 1 tablet (10 mg total) by mouth at bedtime. Once a day  30 tablet  11  . omeprazole (PRILOSEC OTC) 20 MG tablet Take 20 mg by mouth daily.        Marland Kitchen oxybutynin (DITROPAN XL) 15 MG 24 hr tablet Take 15 mg by mouth daily.        . potassium chloride (MICRO-K) 10 MEQ CR capsule Take 2 tablets twice a day.  120 capsule  6    . traZODone (DESYREL) 50 MG tablet Take 50 mg by mouth at bedtime. For sleep       . ranitidine (ZANTAC) 300 MG tablet TAKE ONE TABLET BY MOUTH EVERY DAY AT BEDTIME  30 tablet  11  . DISCONTD: amLODipine (NORVASC) 5 MG tablet Take 5 mg by mouth daily.        Marland Kitchen DISCONTD: atenolol-chlorthalidone (TENORETIC) 100-25 MG per tablet Take 1 tablet by mouth daily.        Marland Kitchen DISCONTD: dicyclomine (BENTYL) 20 MG tablet 1 tablet 3 times a day as needed for abdominal cramping       . DISCONTD: hyoscyamine (LEVSIN, ANASPAZ) 0.125 MG tablet Take 1 tablet (0.125 mg total) by mouth 3 (three) times daily as needed for cramping.  90 tablet  6  . DISCONTD: tobramycin-dexamethasone (TOBRADEX) ophthalmic solution 2 drops in both eyes 3 times a day       . DISCONTD: topiramate (TOPAMAX) 50 MG tablet Take 1 tablet by mouth At bedtime.        No Known Allergies   Review of Systems         See HPI - all other systems neg except as noted... The patient complains of dyspnea on exertion, incontinence, and difficulty walking.  The patient denies anorexia, fever, weight loss, weight gain, vision loss, decreased hearing, hoarseness, chest pain, syncope, peripheral edema, prolonged cough, headaches, hemoptysis, abdominal pain, melena, hematochezia, severe indigestion/heartburn, hematuria, muscle weakness, suspicious skin lesions, transient blindness, depression, unusual weight change, abnormal bleeding, enlarged lymph nodes, and angioedema.     Objective:   Physical Exam     WD, Overweight, 66 y/o BF in NAD... GENERAL:  Alert & oriented; pleasant & cooperative...  HEENT:  [place]/AT, EOM-wnl, PERRLA, EACs-clear, TMs-wnl, NOSE-clear, THROAT-clear & wnl. NECK:  Supple w/ fairROM; no JVD; normal carotid impulses w/o bruits; no thyromegaly or nodules palpated; no lymphadenopathy. CHEST:  Sl decr BS bilat but clear to P & A; without wheezes/ rales/ or rhonchi heard... HEART:  Regular Rhythm; without murmurs/ rubs/ or gallops  detected... ABDOMEN:  Obese, soft & nontender; incr bowel sounds; no organomegaly or masses palpated... EXT: without deformities, mild arthritic changes; no varicose veins/ +venous insuffic/ no edema. NEURO:  CN's intact;  no focal neuro deficits... DERM:  No lesions noted; no rash etc...  RADIOLOGY DATA:  Reviewed in the EPIC EMR & discussed w/ the patient...  LABORATORY DATA:  Reviewed in the EPIC EMR & discussed w/ the patient...   Assessment & Plan:              TIA>>  She reports TIA in Jun2013 treated at [facility] & we have requested records; currently taking ASA/ Plavix w/o recurrent symptoms; she had f/u Cards w/ monitor that she says was nef for AFib...  ASTHMA>  Her chronic obstructive asthma is poorly controlled mostly due to medication compliance issues (not filling Advair etc);  We discussed importance of estab a good regimen she can effectively use every day; in this regard  we will try Albut MDI- 2spQid, Atrovent- 2spQid (given from the [facility] clinic), & Flovent 220- 2spQid (could consider QVAR- whichever is cheaper for her);  Continue the allergy meds (the pollen is a prob for her) w/ Allegra, Singulair, Flonase...  Hopefully she will be able to avoid the freq trips to the clinic for problems & she is asked to call to let me know how she is doing vs return for f/u whenever able...  HBP>  Her meds have been adjusted by the [facility] clinic doctors as above & BP appears adeq controlled, continue Tenoretic, Norvasc, KCl...  AtypCP & Palpit>  These have remained under control on her current regimen, including Alprazolam;  She knows to avoid caffeine;  She had Cards eval in [place] but we don't have these records & she will continue the ASA & f/u in the [place] clinic...  GI>  She has GERD on Prilosec & Zantac w/ good control & not a lot of nocturnal asthma;  Also Divertics/ IBS & the Bentyl isn't covered, therefore try LEVSIN prn...  Stress Incont>  Improved w/ Oxybutynin form [facility] & she  will maintain f/u w/ these doctors...  Anxiety>  We have refilled her Alprazolam;  Her clinic doctors have also written for Trazodone Qhs...   Patient's Medications  New Prescriptions   BENZONATATE (TESSALON) 100 MG CAPSULE    Take 2 capsules (200 mg total) by mouth 3 (three) times daily as needed for cough.   HYDROCODONE-HOMATROPINE (HYCODAN) 5-1.5 MG/5ML SYRUP    Take 5 mLs by mouth every 6 (six) hours as needed for cough.  Previous Medications   ALBUTEROL (PROVENTIL) (2.5 MG/3ML) 0.083% NEBULIZER SOLUTION    Take 3 mLs (2.5 mg total) by nebulization 3 (three) times daily as needed.   ALBUTEROL (VENTOLIN HFA) 108 (90 BASE) MCG/ACT INHALER    Inhale 2 puffs into the lungs every 6 (six) hours as needed for wheezing. 1-2 puffs every 4-6 hrs as needed   AMLODIPINE (NORVASC) 10 MG TABLET    Take 2 tablets by mouth daily   ARTIFICIAL TEARS OPHTHALMIC SOLUTION    As needed for dry eyes    ASPIRIN (BAYER LOW STRENGTH) 81 MG EC TABLET    Take 81 mg by mouth daily.     ATENOLOL (TENORMIN) 100 MG TABLET    Take 100 mg by mouth daily.   CLOPIDOGREL (PLAVIX) 75 MG TABLET    Take 75 mg by mouth daily.   DILTIAZEM (CARDIZEM SR) 90 MG 12 HR CAPSULE    1 tablet 3 times a day    FEXOFENADINE (ALLEGRA) 180 MG TABLET    Once a day as needed for allergies    FLUTICASONE (FLONASE) 50 MCG/ACT NASAL SPRAY    Place 1 spray into the nose daily. 2 sprays each nostril once a day   FLUTICASONE (FLOVENT HFA) 220 MCG/ACT INHALER    Inhale 2 puffs into the lungs 2 (two) times daily.   HYDROCHLOROTHIAZIDE (HYDRODIURIL) 12.5 MG TABLET    Take 12.5 mg by mouth daily.   HYOSCYAMINE (LEVSIN) 0.125 MG TABLET    Take 1 tablet (0.125 mg total) by mouth 3 (three) times daily as needed for cramping.   IPRATROPIUM (ATROVENT) 0.02 % NEBULIZER SOLUTION    Take 2.5 mLs (500 mcg total) by nebulization 3 (three) times daily.   MECLIZINE (ANTIVERT) 25 MG TABLET    Take 1 tablet (25 mg total) by mouth 3 (three) times daily as needed for  dizziness or nausea.  MONTELUKAST (SINGULAIR) 10 MG TABLET    Take 1 tablet (10 mg total) by mouth at bedtime. Once a day   OMEPRAZOLE (PRILOSEC OTC) 20 MG TABLET    Take 20 mg by mouth daily.     OXYBUTYNIN (DITROPAN XL) 15 MG 24 HR TABLET    Take 15 mg by mouth daily.     POTASSIUM CHLORIDE (MICRO-K) 10 MEQ CR CAPSULE    Take 2 tablets twice a day.   RANITIDINE (ZANTAC) 300 MG TABLET    TAKE ONE TABLET BY MOUTH EVERY DAY AT BEDTIME   TRAZODONE (DESYREL) 50 MG TABLET    Take 50 mg by mouth at bedtime. For sleep   Modified Medications   Modified Medication Previous Medication   ALPRAZOLAM (XANAX) 0.25 MG TABLET ALPRAZolam (XANAX) 0.25 MG tablet      Take 2 tablets (0.5 mg total) by mouth 3 (three) times daily as needed for sleep. 1 tablet up to 3 times a day as needed for nerves    Take 2 tablets (0.5 mg total) by mouth 3 (three) times daily as needed for sleep. 1 tablet up to 3 times a day as needed for nerves   FLUTICASONE-SALMETEROL (ADVAIR DISKUS) 250-50 MCG/DOSE AEPB Fluticasone-Salmeterol (ADVAIR DISKUS) 250-50 MCG/DOSE AEPB      Inhale 1 puff into the lungs every 12 (twelve) hours.    Inhale 1 puff into the lungs every 12 (twelve) hours.  Discontinued Medications   AMLODIPINE (NORVASC) 5 MG TABLET    Take 5 mg by mouth daily.     ATENOLOL-CHLORTHALIDONE (TENORETIC) 100-25 MG PER TABLET    Take 1 tablet by mouth daily.     DICYCLOMINE (BENTYL) 20 MG TABLET    1 tablet 3 times a day as needed for abdominal cramping    HYOSCYAMINE (LEVSIN, ANASPAZ) 0.125 MG TABLET    Take 1 tablet (0.125 mg total) by mouth 3 (three) times daily as needed for cramping.   TOBRAMYCIN-DEXAMETHASONE (TOBRADEX) OPHTHALMIC SOLUTION    2 drops in both eyes 3 times a day    TOPIRAMATE (TOPAMAX) 50 MG TABLET    Take 1 tablet by mouth At bedtime.

## 2012-08-08 ENCOUNTER — Telehealth: Payer: Self-pay | Admitting: Pulmonary Disease

## 2012-08-08 NOTE — Telephone Encounter (Signed)
Received 36 pages from [facility]. Sent to Dr. Kriste Basque. 08/08/12/sd

## 2012-08-09 ENCOUNTER — Telehealth: Payer: Self-pay | Admitting: Pulmonary Disease

## 2012-08-09 MED ORDER — FLUTICASONE-SALMETEROL 250-50 MCG/DOSE IN AEPB: 1.0000 | INHALATION_SPRAY | Freq: Two times a day (BID) | RESPIRATORY_TRACT | Status: DC

## 2012-08-09 NOTE — Telephone Encounter (Signed)
Called and spoke with pt and she is aware of samples left up front for her.

## 2012-08-27 ENCOUNTER — Telehealth: Payer: Self-pay | Admitting: Pulmonary Disease

## 2012-08-27 NOTE — Telephone Encounter (Signed)
This is a SN pt.  Pt returned triage's call & can be reached at [phone].  Rachael Peck

## 2012-08-27 NOTE — Telephone Encounter (Signed)
lmomtcb  

## 2012-08-29 NOTE — Telephone Encounter (Signed)
lmomtcb x1 for pt 

## 2012-08-29 NOTE — Telephone Encounter (Signed)
Per SN----call in tessalon perles  #100  2 po tid prn cough with 5 refills.   Cont all meds--mucinex 2 po bid, fluids, deslym and hycodan.  thanks

## 2012-08-29 NOTE — Telephone Encounter (Signed)
PT c/o ongoing cough for 6 weeks.  Prod (clear).  SOB is about the same.  Unable to leave house due to urine leakage due to cough.  Denies sinus drainage, fever or sorethroat.  Pt has been using Mucinex, Delsym and Hycodan rx prescribed by Dr Kriste Basque.  Please advise.

## 2012-08-30 MED ORDER — BENZONATATE 100 MG PO CAPS: 200.0000 mg | ORAL_CAPSULE | Freq: Three times a day (TID) | ORAL | Status: DC | PRN

## 2012-08-30 NOTE — Telephone Encounter (Signed)
Spoke with patient-aware of recs and Rx from SN; also double checked with SN about mg for Tessalon-told to give 100 mg tablets. Rx sent to pharmacy.

## 2012-11-16 ENCOUNTER — Other Ambulatory Visit: Payer: Self-pay | Admitting: Adult Health

## 2012-12-12 ENCOUNTER — Other Ambulatory Visit: Payer: Self-pay | Admitting: Pulmonary Disease

## 2012-12-19 ENCOUNTER — Telehealth: Payer: Self-pay | Admitting: Pulmonary Disease

## 2012-12-19 NOTE — Telephone Encounter (Signed)
hydromet 5-1.5/5 syrup<> take 1 tsp (5ml) by mouth every 6 hours as needed for cough.  Last fill 09-04-12 Ov 02-05-13/ No Known Allergies Dr Kriste Basque  Please advise thank you

## 2012-12-19 NOTE — Telephone Encounter (Signed)
Refill has been sent to the pharmacy 2/26 with 1 refill. Nothing further is needed.

## 2013-01-15 ENCOUNTER — Other Ambulatory Visit: Payer: Self-pay | Admitting: Pulmonary Disease

## 2013-01-30 ENCOUNTER — Telehealth: Payer: Self-pay | Admitting: Pulmonary Disease

## 2013-01-30 ENCOUNTER — Encounter: Payer: Self-pay | Admitting: Pulmonary Disease

## 2013-01-30 DIAGNOSIS — C50919 Malignant neoplasm of unspecified site of unspecified female breast: Secondary | ICD-10-CM | POA: Insufficient documentation

## 2013-01-30 NOTE — Telephone Encounter (Addendum)
Called and spoke with pt and she stated that she was dx with breast cancer.  3/3 lumpectomy.  Margins did not come back clear and 3/31 did another lumpectomy.  She will start on radiation next week  as they feel this will clear this up.  Pt stated that she she had to go through a simulation for the radiation.  She stated that since this she has been coughing and having post nasal drip.  Pt to call back for any other concerns or questions.  Pt wanted SN to know what was going on.  Will sign off of this message and make SN aware.

## 2013-02-05 ENCOUNTER — Ambulatory Visit: Payer: Medicare HMO | Admitting: Pulmonary Disease

## 2013-02-06 ENCOUNTER — Other Ambulatory Visit: Payer: Self-pay | Admitting: Pulmonary Disease

## 2013-02-06 MED ORDER — PROMETHAZINE-CODEINE 6.25-10 MG/5ML PO SYRP: 5.0000 mL | ORAL_SOLUTION | Freq: Four times a day (QID) | ORAL | Status: DC | PRN

## 2013-02-06 NOTE — Telephone Encounter (Signed)
Pharmacy faxed over PA for the hycodan.  Per SN---ok to change to promethazine with codeine  #8 oz  1 tsp every 6 hours prn cough.  With 2 refills.  This has been called to the pharmacy and med list has been updated.

## 2013-02-15 ENCOUNTER — Other Ambulatory Visit: Payer: Self-pay | Admitting: Pulmonary Disease

## 2013-04-15 ENCOUNTER — Ambulatory Visit: Payer: Medicare HMO | Admitting: Pulmonary Disease

## 2013-04-15 ENCOUNTER — Other Ambulatory Visit: Payer: Self-pay | Admitting: Pulmonary Disease

## 2013-05-03 ENCOUNTER — Encounter: Payer: Self-pay | Admitting: Pulmonary Disease

## 2013-05-03 ENCOUNTER — Ambulatory Visit (INDEPENDENT_AMBULATORY_CARE_PROVIDER_SITE_OTHER): Payer: Medicare HMO | Admitting: Pulmonary Disease

## 2013-05-03 VITALS — BP 130/74 | HR 69 | Temp 97.5°F | Ht 68.0 in | Wt 195.2 lb

## 2013-05-03 DIAGNOSIS — G459 Transient cerebral ischemic attack, unspecified: Secondary | ICD-10-CM

## 2013-05-03 DIAGNOSIS — C50919 Malignant neoplasm of unspecified site of unspecified female breast: Secondary | ICD-10-CM

## 2013-05-03 DIAGNOSIS — J4489 Other specified chronic obstructive pulmonary disease: Secondary | ICD-10-CM

## 2013-05-03 DIAGNOSIS — J449 Chronic obstructive pulmonary disease, unspecified: Secondary | ICD-10-CM

## 2013-05-03 DIAGNOSIS — F411 Generalized anxiety disorder: Secondary | ICD-10-CM

## 2013-05-03 DIAGNOSIS — I10 Essential (primary) hypertension: Secondary | ICD-10-CM

## 2013-05-03 DIAGNOSIS — J309 Allergic rhinitis, unspecified: Secondary | ICD-10-CM

## 2013-05-03 MED ORDER — IPRATROPIUM BROMIDE 0.02 % IN SOLN: RESPIRATORY_TRACT | Status: DC

## 2013-05-03 MED ORDER — FLUTICASONE PROPIONATE 50 MCG/ACT NA SUSP: NASAL | Status: DC

## 2013-05-03 MED ORDER — FLUTICASONE PROPIONATE HFA 220 MCG/ACT IN AERO: 2.0000 | INHALATION_SPRAY | Freq: Two times a day (BID) | RESPIRATORY_TRACT | Status: DC

## 2013-05-03 MED ORDER — ALBUTEROL SULFATE HFA 108 (90 BASE) MCG/ACT IN AERS: INHALATION_SPRAY | RESPIRATORY_TRACT | Status: DC

## 2013-05-03 MED ORDER — ALBUTEROL SULFATE (2.5 MG/3ML) 0.083% IN NEBU: 2.5000 mg | INHALATION_SOLUTION | Freq: Three times a day (TID) | RESPIRATORY_TRACT | Status: DC | PRN

## 2013-05-03 MED ORDER — MONTELUKAST SODIUM 10 MG PO TABS: ORAL_TABLET | ORAL | Status: DC

## 2013-05-03 NOTE — Patient Instructions (Addendum)
Today we updated your med list in our EPIC system...    Continue your current medications the same...    We refilled your breathing meds per request & gave you some Advair samples...  Continue your regular medical check ups by DrCopper at [place] clinic...  Continue your breast cancer follow up at [facility]...  Call for any questions...  Let's plan a follow up visit in 6-9, sooner if needed for problems.Marland KitchenMarland Kitchen

## 2013-05-03 NOTE — Progress Notes (Signed)
Subjective:    Patient ID: Rachael Peck, female    DOB: 20-Mar-1946, 67 y.o.   MRN: [id number]  HPI 67 y/o BF here for a follow up visit... she has multiple medical problems including:  AR;  Asthma;  HBP;  Hx atypCP & palpit;  Overweight;  GERD;  Divertics;  Stress incontinence;  Anxiety... She lives in [place] and she tells me she is followed for general medical purposes in the [facility] & was referred to [facility] for cardiac eval- we do not have any of these records- she denies recurrent CP, palpit, etc...   ~  December 01, 2009:  ROV w/ disability papers recently completed for her... incr stress w/ mother on hospice in [place] due to renal failure- they live together & Rachael Peck cares for her... asking for nerve pill & ALPRAZOLAM 0.25mg  written... she had a viral gastroenteritis 10/10, seen in the [facility] clinic & resolved... she reports breathing is stable;  BP controlled on meds;  had GYN check in [place] at [facility] but they didn't say anything about her leakage... due for f/u PFT's today> see below prob list...  ~  February 02, 2011:  14 month ROV & her social situation is the same- living in [place] & receives her general care via the [facility], DrCopper, she says they do her blood work as well & she does not want additional labs here today;  Her Asthma remains poorly controlled (she has "episodes every month & goes to the clinic that often), mostly due to medication compliance issues, & it appears that the Advair may not be covered but she is vague about this;  She was given Atrovent inhaler at the Clinic & she feels this helps> we discussed adjusting Rx for least expensive regimen: try Albuterol MDI- 2spQid, Atrovent- 2spQid, & Flovent 220- 2spBid;  We reviewed her current med list in detail & reconciled the list as below...  ~  February 07, 2012:  Yearly ROV & Rachael Peck recently saw TP w/ Bronchitis & treated w/ Omnicef, Mucinex, Hydromet;  She tells me that she continues to  get her primary care from the [facility];  Her CC today is allergy problems from the pollen & we discussed OTC antihist & nasal saline, etc...     We reviewed prob list, meds, xrays and labs> see below for updates >>  ~  August 06, 2012:  22mo ROV & Rachael Peck notes "this has been one of my best yrs" but states that her Asthma has "kicked up" lately- c/o cough, clear drainage, & some SOB; Rec to add Mucinex-2Bid + Fluids (see below)...  She was seen in the [facility] ER 6/13 w/ a TIA, followed up w/ her [facility] L[facility] & referred to [facility] Neurology (placed on ASA/ Plavix) & [facility] (monitor placed looking for AFib) => we do not have notes.    AR/ Asthma> on Allegra/ Flonase, NEBS w/ Albut/Ipratrop Qid, [id number], Singulair10; ?gets meds in [place]? Asked to add Mucinex-2Bid + fluids...    HBP> on Aten100, Hct12.5, Amlod5-2tabs/d, & K10-2Bid; BP= 142/74 & she denies CP, palpit, ch in SOB, edema...    Overweight> "Im watching my diet" and weight = 202# (down 2#) & BMI=31; we reviewed diet & exercise strategies...    GI> GERD, Divertics> on Prilosec20, Zantac300Qhs, & Levsin prn; denies abd pain, n/v, c/d, blood seen...    GU> Stress Incont> on Oxybut15mg /d; she states voiding is better & leaking less.Marland KitchenMarland Kitchen  TIA> seen in ER at [facility] 6/13- f/u w/ Neuro & Cards at [facility]; we have requested records...    Anxiety> on Trazodone50Qhs, Alprazolam0.25mg  prn; meds refilled per request... We reviewed prob list, meds, xrays and labs> see below for updates >> she had the 2013 Flu vaccine 9/13; she requested refill Rxs from [place]- ok... Only labs/ XRays here were in 2010 as she assures me that XRays, EKGs, LABS all done at [facility] (we have requested records)...  ~  May 03, 2013:  105mo ROV & Rachael Peck reports that since her last visit she noted blood in her bra from nipple disch, initial mammogram was neg but check up from her primary doctor (DrCopper- [facility] in [place]) lead to a bx from surg  at [facility] w/ Dx of Breast Cancer> Surg- DrMcNutt (initial excision followed by 2nd surg for pos margins), XRT- DrBrown, Oncology- DrSwift now on Tamoxifen & doing very well by her report;  She has also seen GI- DrConway for diarrhea & she reports that he wanted to do EGD & Colon but she declined & symptom is improving on it's own now- encouraged to discuss this w/ her primary in [place]...    Her breathing has been good- notes min cough, less in the way of nocturnal symptoms, mild DOE but stable she says; no prob w/ her breathing throughout her cancer Rx...    AR/ Asthma> on Allegra/ Flonase, NEBS w/ Albut/Ipratrop Qid, Advair250 (only uses samples due to $$) vs Flovent220-2slBid, Singulair10; ?gets meds in [place] clinic? Asked to add Mucinex-2Bid + fluids... We reviewed prob list, meds, xrays and labs> see below for updates >>           Problem List:     ALLERGIC RHINITIS (ICD-477.9) - on ALLEGRA 180mg /d, & FLONASE Qhs...   CHRONIC OBSTRUCTIVE ASTHMA UNSPECIFIED (ICD-493.20) - on ADVAIR 250Bid (but not compliant),  PROAIR HFA Prn (using 2-3 times daily), ATROVENT 2spQid (started by the [facility] clinic in [place]), & MUCINEX 1-2Bid... ~  baseline CXR 11/08 showed sl hyperaeration, clear, NAD.Marland Kitchen. spondylitic changes in TSpine noted. ~  PFT 3/09 showed FVC= 3.36 (92%), FEV1= 2.13 (78%), %1sec= 63, mid-flows= 26% pred;  TLC= 6.61 (112%), RV= 3.25 (145%), RV/TLC= 49%;  DLCO= norm... ~  f/u CXR 8/10 showed min bibasilar atx, NAD... ~  PFT 2/11 showed FVC= 2.22 (73%), FEV1= 1.68 (70%), %1sec=76, mid-flows= 57%pred. ~  4/12:  58mo ROV here & she tells me she is getting her care thru the [facility] in [place]; just wants refills today- declines CXR, labs, etc; she hasn't been taking the Advair due to cost & we discussed regimen w/ ALBUTEROL 2spQid, ATROVENT 2spQid, FLOVENT 220- 2spBid, Singulair 10mg /d, Mucinex 2Bid, fluids, etc... ~  4/13:  Yearly ROV & she remains stable, same meds & followed in [place] as noted... ~   10/13:  Mild exac treated w/ her NEBS Qid, Advair250, Mucinex2Bid, Hycodan cough syrup... ~  7/14: on Allegra/ Flonase, NEBS w/ Albut/Ipratrop Qid, Advair250 (only uses samples due to $$) vs Flovent220-2slBid, Singulair10; ?gets meds in [place] clinic? Asked to add Mucinex-2Bid + fluids.  HYPERTENSION (ICD-401.9) - controlled on ATENOLOL100, HCTZ12.5,  KCl 10-2Bid,  & AMLODIPINE now 10mg /d (from [employer])... ~  labs 8/10 showed K= 3.1, BUN= 10, Creat= 0.7... rec> incr K10 to 2Bid... ~  subseq labs all done in the [facility] clinic she says & they cut her KCl to 1Bid... ~  4/12:  BP today = 138/80 and doing well-  denies HA, fatigue, visual changes, CP, palipit, dizziness, syncope, dyspnea, edema, etc... ~  4/13:  BP= 128/82 & she denies CP, palpit, dizzy, SOB, edema, etc... ~  10/13:  BP= 142/74 & she denies CP, palpit, ch in SOB, edema... ~  7/14:  on Aten100, Hct12.5, Amlod5-2tabs/d, & K10-2Bid; BP= 130/74 & she denies CP, palpit, ch in SOB, edema  CHEST PAIN, ATYPICAL (ICD-786.59) - on ASA 81mg /d... she denies recurrent CP, exertional CP, etc... ~  FLP 12/08 by [facility]Vyas at [facility] showed TChol 130, TG 107, HDL 39, LDL 70... ~  pt notes she had 2DEcho at [facility] & will request copies of notes to [place] (never received)...  PALPITATIONS (ICD-785.1) - hx palpitations in the past... no recent symptoms on meds above & she knows to avoid caffeine, etc...  OVERWEIGHT (ICD-278.02) - we reviewed diet + exercise. ~  weight 8/09 = 209# ~  weight 2/10 = 213# ~  weight 8/10 = 206# ~  weight 2/11 = 206# ~  Weight 4/12 = 207# ~  Weight 4/13 = 204# ~  Weight 10/13 = 202# ~  Weight 7/14 = 195#  GERD (ICD-530.81) - on PRILOSEC 20mg Qam,  ZANTAC 300mg Qhs... she has a hx of GERD and LER controlled on these meds...   DIVERTICULOSIS OF COLON (ICD-562.10) - last colonoscopy 7/04 by DrJEdwards- showed divertics only... ~  4/12:  She has some classic IBS-like symptoms & we will try LEVSIN 0.125mg  Tid as needed...  BREAST  CANCER >> she noted blood in her bra from nipple disch, [place] mammogram in [place] was neg but check up from her primary doctor (DrCopper- [facility] in [place]) lead to a bx from surg at [facility] w/ Dx of Breast Cancer> Surg- DrMcNutt (initial excision followed by 2nd surg for pos margins), XRT- DrBrown, Oncology- DrSwift now on Tamoxifen & doing very well by her report...   STRESS INCONTINENCE (ICD-788.39) - she claims leakage of urine and stool- she has been recommended to f/u w/ GYN for this eval... ~  2/11:  she reports eval at [facility] in [place] 10/10 but they didn't address leakage... ~  4/12:  She reports that the [facility] clinic doctors have started Rx w/ OXYBUTYNIN 15mg /d for overactive bladder symptoms...  TIA >> on ASA 81mg /d & prev on Plavix as well... ~  6/13: she was seen at the [facility] ER w/ TIA (records pending)... ~  She was referred to her LMD at the [facility], then sent to [facility] Neurology & [facility] Cardiology- we have requested records...  ANXIETY (ICD-300.00) - states that she is under stress due to her condition and having to care for her mother (on hospice w/ renal failure)... Rx for ALPRAZOLAM 0.25mg  Tid Prn written... ~  4/12:  She reports that [facility] clinic doctors have prescribed TRAZODONE 50mg  Qhs for sleep... ~  4/13:  She notes that everything is stable> on Trazodone & Xanax...   Past Surgical History  Procedure Laterality Date  . Abdominal hysterectomy    . Breast lumpectomy  12/17/2012 & 01/14/2013    Outpatient Encounter Prescriptions as of 05/03/2013  Medication Sig Dispense Refill  . albuterol (PROVENTIL) (2.5 MG/3ML) 0.083% nebulizer solution Take 3 mLs (2.5 mg total) by nebulization 3 (three) times daily as needed.  270 mL  11  . ALPRAZolam (XANAX) 0.25 MG tablet Take 2 tablets (0.5 mg total) by mouth 3 (three) times daily as needed for sleep. 1 tablet up to 3 times a day as needed for nerves  90 tablet  5  . amLODipine (NORVASC) 10 MG tablet Take 2 tablets by  mouth daily      . ARTIFICIAL TEARS ophthalmic solution As needed for dry eyes       . aspirin (BAYER LOW STRENGTH) 81 MG EC tablet Take 81 mg by mouth daily.        Marland Kitchen atenolol (TENORMIN) 100 MG tablet Take 100 mg by mouth daily.      . benzonatate (TESSALON) 100 MG capsule Take 2 capsules (200 mg total) by mouth 3 (three) times daily as needed for cough.  100 capsule  5  . fexofenadine (ALLEGRA) 180 MG tablet Once a day as needed for allergies       . fluticasone (FLONASE) 50 MCG/ACT nasal spray USE TWO SPRAYS IN EACH NOSTRIL EVERY DAY  16 g  2  . fluticasone (FLOVENT HFA) 220 MCG/ACT inhaler Inhale 2 puffs into the lungs 2 (two) times daily.  1 Inhaler  11  . Fluticasone-Salmeterol (ADVAIR DISKUS) 250-50 MCG/DOSE AEPB Inhale 1 puff into the lungs every 12 (twelve) hours.  14 each  0  . hydrochlorothiazide (HYDRODIURIL) 12.5 MG tablet Take 12.5 mg by mouth daily.      . hyoscyamine (LEVSIN) 0.125 MG tablet Take 1 tablet (0.125 mg total) by mouth 3 (three) times daily as needed for cramping.  90 tablet  11  . ipratropium (ATROVENT) 0.02 % nebulizer solution USE ONE VIAL VIA NEBULIZER THREE TIMES DAILY  300 mL  1  . meclizine (ANTIVERT) 25 MG tablet TAKE ONE TABLET BY MOUTH THREE TIMES DAILY AS NEEDED FOR DIZZINESS OR NAUSEA.  90 tablet  1  . montelukast (SINGULAIR) 10 MG tablet TAKE ONE TABLET BY MOUTH EVERY DAY AT BEDTIME  30 tablet  2  . oxybutynin (DITROPAN XL) 15 MG 24 hr tablet Take 15 mg by mouth daily.        . potassium chloride (MICRO-K) 10 MEQ CR capsule TAKE TWO CAPSULES BY MOUTH TWICE DAILY  120 capsule  2  . promethazine-codeine (PHENERGAN WITH CODEINE) 6.25-10 MG/5ML syrup Take 5 mLs by mouth every 6 (six) hours as needed for cough.  240 mL  2  . ranitidine (ZANTAC) 300 MG tablet TAKE ONE TABLET BY MOUTH EVERY DAY AT BEDTIME  30 tablet  10  . tamoxifen (NOLVADEX) 20 MG tablet Take 20 mg by mouth daily.      . traZODone (DESYREL) 50 MG tablet Take 50 mg by mouth at bedtime. For sleep        . VENTOLIN HFA 108 (90 BASE) MCG/ACT inhaler INHALE ONE TO TWO PUFFS INTO LUNGS EVERY 4 TO 6 HOURS AS NEEDED  18 each  2  . [DISCONTINUED] clopidogrel (PLAVIX) 75 MG tablet Take 75 mg by mouth daily.      . [DISCONTINUED] diltiazem (CARDIZEM SR) 90 MG 12 hr capsule 1 tablet 3 times a day       . [DISCONTINUED] omeprazole (PRILOSEC OTC) 20 MG tablet Take 20 mg by mouth daily.         No facility-administered encounter medications on file as of 05/03/2013.    No Known Allergies   Review of Systems         See HPI - all other systems neg except as noted... The patient complains of dyspnea on exertion, incontinence, and difficulty walking.  The patient denies anorexia, fever, weight loss, weight gain, vision loss, decreased hearing, hoarseness, chest pain, syncope, peripheral edema, prolonged cough, headaches, hemoptysis, abdominal pain,  melena, hematochezia, severe indigestion/heartburn, hematuria, muscle weakness, suspicious skin lesions, transient blindness, depression, unusual weight change, abnormal bleeding, enlarged lymph nodes, and angioedema.     Objective:   Physical Exam     WD, Overweight, 67 y/o BF in NAD... GENERAL:  Alert & oriented; pleasant & cooperative...  HEENT:  [place]/AT, EOM-wnl, PERRLA, EACs-clear, TMs-wnl, NOSE-clear, THROAT-clear & wnl. NECK:  Supple w/ fairROM; no JVD; normal carotid impulses w/o bruits; no thyromegaly or nodules palpated; no lymphadenopathy. CHEST:  Sl decr BS bilat but clear to P & A; without wheezes/ rales/ or rhonchi heard... HEART:  Regular Rhythm; without murmurs/ rubs/ or gallops detected... ABDOMEN:  Obese, soft & nontender; incr bowel sounds; no organomegaly or masses palpated... EXT: without deformities, mild arthritic changes; no varicose veins/ +venous insuffic/ no edema. NEURO:  CN's intact;  no focal neuro deficits... DERM:  No lesions noted; no rash etc...  RADIOLOGY DATA:  Reviewed in the EPIC EMR & discussed w/ the  patient...  LABORATORY DATA:  Reviewed in the EPIC EMR & discussed w/ the patient...   Assessment & Plan:               ASTHMA>  Her chronic obstructive asthma has been better regulated w/ meds from the [place] [facility] w/ their samples etc... She would like to continue coming here for her breathing & asthma f/u even though she gets all of her general care, neurology f/u & now cancer f/u in [place] and [facility]...   HBP>  Her meds have been adjusted by the [facility] clinic doctors as above & BP appears adeq controlled, continue Tenoretic, Norvasc, KCl...  AtypCP & Palpit>  These have remained under control on her current regimen, including Alprazolam;  She knows to avoid caffeine;  She had Cards eval in [place] but we don't have these records & she will continue the ASA & f/u in the [place] clinic...  GI>  She has GERD on Prilosec & Zantac w/ good control & not a lot of nocturnal asthma;  Also Divertics/ IBS & the Bentyl isn't covered, therefore try LEVSIN prn...  Breast Cancer>  eval and Rx at [facility] as above...  Stress Incont>  Improved w/ Oxybutynin form [facility] & she will maintain f/u w/ these doctors...  TIA>>  She reports TIA in Jun2013 treated at [facility] currently taking ASA/ & off Plavix w/o recurrent symptoms; she had f/u Cards w/ monitor that she says was nef for AFib...  Anxiety>  We have refilled her Alprazolam;  Her clinic doctors have also written for Trazodone Qhs...   Patient's Medications  New Prescriptions   No medications on file  Previous Medications   ALPRAZOLAM (XANAX) 0.25 MG TABLET    Take 2 tablets (0.5 mg total) by mouth 3 (three) times daily as needed for sleep. 1 tablet up to 3 times a day as needed for nerves   AMLODIPINE (NORVASC) 10 MG TABLET    Take 5 mg by mouth daily.    ARTIFICIAL TEARS OPHTHALMIC SOLUTION    As needed for dry eyes    ASPIRIN (BAYER LOW STRENGTH) 81 MG EC TABLET    Take 81 mg by mouth daily.     ATENOLOL (TENORMIN) 100 MG TABLET    Take  100 mg by mouth daily.   BENZONATATE (TESSALON) 100 MG CAPSULE    Take 2 capsules (200 mg total) by mouth 3 (three) times daily as needed for cough.   FEXOFENADINE (ALLEGRA) 180 MG TABLET  Once a day as needed for allergies    HYDROCHLOROTHIAZIDE (HYDRODIURIL) 12.5 MG TABLET    Take 12.5 mg by mouth daily.   HYOSCYAMINE (LEVSIN) 0.125 MG TABLET    Take 1 tablet (0.125 mg total) by mouth 3 (three) times daily as needed for cramping.   MECLIZINE (ANTIVERT) 25 MG TABLET    TAKE ONE TABLET BY MOUTH THREE TIMES DAILY AS NEEDED FOR DIZZINESS OR NAUSEA.   OXYBUTYNIN (DITROPAN XL) 15 MG 24 HR TABLET    Take 15 mg by mouth daily.     POTASSIUM CHLORIDE (MICRO-K) 10 MEQ CR CAPSULE    TAKE TWO CAPSULES BY MOUTH TWICE DAILY   PROMETHAZINE-CODEINE (PHENERGAN WITH CODEINE) 6.25-10 MG/5ML SYRUP    Take 5 mLs by mouth every 6 (six) hours as needed for cough.   RANITIDINE (ZANTAC) 300 MG TABLET    TAKE ONE TABLET BY MOUTH EVERY DAY AT BEDTIME   TAMOXIFEN (NOLVADEX) 20 MG TABLET    Take 20 mg by mouth daily.   TRAZODONE (DESYREL) 50 MG TABLET    Take 50 mg by mouth at bedtime. For sleep   Modified Medications   Modified Medication Previous Medication   ALBUTEROL (PROVENTIL) (2.5 MG/3ML) 0.083% NEBULIZER SOLUTION albuterol (PROVENTIL) (2.5 MG/3ML) 0.083% nebulizer solution      Take 3 mLs (2.5 mg total) by nebulization 3 (three) times daily as needed.    Take 3 mLs (2.5 mg total) by nebulization 3 (three) times daily as needed.   ALBUTEROL (VENTOLIN HFA) 108 (90 BASE) MCG/ACT INHALER VENTOLIN HFA 108 (90 BASE) MCG/ACT inhaler      INHALE ONE TO TWO PUFFS INTO LUNGS EVERY 4 TO 6 HOURS AS NEEDED    INHALE ONE TO TWO PUFFS INTO LUNGS EVERY 4 TO 6 HOURS AS NEEDED   FLUTICASONE (FLONASE) 50 MCG/ACT NASAL SPRAY fluticasone (FLONASE) 50 MCG/ACT nasal spray      USE TWO SPRAYS IN EACH NOSTRIL EVERY DAY    USE TWO SPRAYS IN EACH NOSTRIL EVERY DAY   FLUTICASONE (FLOVENT HFA) 220 MCG/ACT INHALER fluticasone (FLOVENT HFA)  220 MCG/ACT inhaler      Inhale 2 puffs into the lungs 2 (two) times daily.    Inhale 2 puffs into the lungs 2 (two) times daily.   FLUTICASONE-SALMETEROL (ADVAIR) 250-50 MCG/DOSE AEPB Fluticasone-Salmeterol (ADVAIR DISKUS) 250-50 MCG/DOSE AEPB      Samples only    Inhale 1 puff into the lungs every 12 (twelve) hours.   IPRATROPIUM (ATROVENT) 0.02 % NEBULIZER SOLUTION ipratropium (ATROVENT) 0.02 % nebulizer solution      USE ONE VIAL VIA NEBULIZER THREE TIMES DAILY    USE ONE VIAL VIA NEBULIZER THREE TIMES DAILY   MONTELUKAST (SINGULAIR) 10 MG TABLET montelukast (SINGULAIR) 10 MG tablet      TAKE ONE TABLET BY MOUTH EVERY DAY AT BEDTIME    TAKE ONE TABLET BY MOUTH EVERY DAY AT BEDTIME  Discontinued Medications   CLOPIDOGREL (PLAVIX) 75 MG TABLET    Take 75 mg by mouth daily.   DILTIAZEM (CARDIZEM SR) 90 MG 12 HR CAPSULE    1 tablet 3 times a day    OMEPRAZOLE (PRILOSEC OTC) 20 MG TABLET    Take 20 mg by mouth daily.

## 2013-06-24 ENCOUNTER — Other Ambulatory Visit: Payer: Self-pay | Admitting: Pulmonary Disease

## 2013-06-24 MED ORDER — POTASSIUM CHLORIDE ER 10 MEQ PO CPCR: ORAL_CAPSULE | ORAL | Status: DC

## 2013-10-15 ENCOUNTER — Telehealth: Payer: Self-pay | Admitting: Pulmonary Disease

## 2013-10-15 MED ORDER — AZITHROMYCIN 250 MG PO TABS: 250.0000 mg | ORAL_TABLET | ORAL | Status: DC

## 2013-10-15 MED ORDER — METHYLPREDNISOLONE 4 MG PO KIT: PACK | ORAL | Status: DC

## 2013-10-15 NOTE — Telephone Encounter (Signed)
Per SN---  zpak #1  Take as directed Medrol dosepak #1  Take as directed 

## 2013-10-15 NOTE — Telephone Encounter (Signed)
Pt aware that meds called into [facility] pharmacy

## 2013-10-15 NOTE — Telephone Encounter (Signed)
Last OV 05-03-13. Pt is c/o having chest tightness, increased SOB, wheezing, productive cough with clear phlegm all x 2 days. Please advise. Rachael Peck, CMA Pharmacy [facility] in [place] No Known Allergies\

## 2013-10-25 ENCOUNTER — Other Ambulatory Visit: Payer: Self-pay | Admitting: Adult Health

## 2013-11-25 ENCOUNTER — Telehealth: Payer: Self-pay | Admitting: Pulmonary Disease

## 2013-11-25 NOTE — Telephone Encounter (Signed)
OV has been made for tomorrow with TP at 12pm.

## 2013-11-26 ENCOUNTER — Ambulatory Visit: Payer: Medicare HMO | Admitting: Adult Health

## 2014-01-22 ENCOUNTER — Other Ambulatory Visit: Payer: Self-pay | Admitting: Pulmonary Disease

## 2014-02-04 ENCOUNTER — Other Ambulatory Visit: Payer: Self-pay | Admitting: Adult Health

## 2014-02-04 ENCOUNTER — Ambulatory Visit (INDEPENDENT_AMBULATORY_CARE_PROVIDER_SITE_OTHER)
Admission: RE | Admit: 2014-02-04 | Discharge: 2014-02-04 | Disposition: A | Discharge status: Home / Self Care | Payer: Medicare HMO | Source: Ambulatory Visit | Attending: Adult Health | Admitting: Adult Health

## 2014-02-04 ENCOUNTER — Ambulatory Visit (INDEPENDENT_AMBULATORY_CARE_PROVIDER_SITE_OTHER): Payer: Medicare HMO | Admitting: Adult Health

## 2014-02-04 ENCOUNTER — Encounter: Payer: Self-pay | Admitting: Adult Health

## 2014-02-04 VITALS — BP 150/74 | HR 92 | Temp 98.2°F | Ht 68.0 in | Wt 204.4 lb

## 2014-02-04 DIAGNOSIS — R05 Cough: Secondary | ICD-10-CM

## 2014-02-04 DIAGNOSIS — R059 Cough, unspecified: Secondary | ICD-10-CM

## 2014-02-04 DIAGNOSIS — J449 Chronic obstructive pulmonary disease, unspecified: Secondary | ICD-10-CM

## 2014-02-04 MED ORDER — CEFDINIR 300 MG PO CAPS
300.0000 mg | ORAL_CAPSULE | Freq: Two times a day (BID) | ORAL | Status: DC
Start: 2014-02-04 — End: 2014-02-04

## 2014-02-04 MED ORDER — HYDROCODONE-HOMATROPINE 5-1.5 MG/5ML PO SYRP: 5.0000 mL | ORAL_SOLUTION | Freq: Four times a day (QID) | ORAL | Status: DC | PRN

## 2014-02-04 MED ORDER — PREDNISONE 10 MG PO TABS: ORAL_TABLET | ORAL | Status: DC

## 2014-02-04 MED ORDER — CEFDINIR 300 MG PO CAPS: 300.0000 mg | ORAL_CAPSULE | Freq: Two times a day (BID) | ORAL | Status: DC

## 2014-02-04 NOTE — Assessment & Plan Note (Addendum)
Flare with Bronchitis   Plan  Omnicef 300mg  Twice daily  For 7 days  Prednisone taper over next week.  Mucinex DM .Twice daily  For cough/congestion  Hydromet 1 tsp every 6hr as needed, may make you sleepy.  Restart Advair 1 puff Twice daily   Follow up Dr. Lenna Gilford  In 3 months and As needed   Please contact office for sooner follow up if symptoms do not improve or worsen or seek emergency care

## 2014-02-04 NOTE — Progress Notes (Signed)
Subjective:    Patient ID: Rachael Peck, female    DOB: 1946/07/19, 68 y.o.   MRN: [id number]  HPI 68 y/o BF she has multiple medical problems including:  AR;  Asthma;  HBP;  Hx atypCP & palpit;  Overweight;  GERD;  Divertics;  Stress incontinence;  Anxiety...   02/04/2014 Acute OV  Complains of increased SOB,  prod cough with yellow mucus x3 weeks, worse x 4 days.  Feels very weak.  Not taking Advair or Flovent due to cost.  Takes Albuterol Atrovent Neb as needed.  Denies hemoptysis, f/c/s, nausea, vomiting Cough is keeping her up at night  Last abx was 09/2013.  Followed by Dr. Posey Pronto At [facility] clinic for PCP .  Has been using over-the-counter cough medicines without any relief.        Problem List:     ALLERGIC RHINITIS (ICD-477.9) - on ALLEGRA 180mg /d, & FLONASE Qhs...   CHRONIC OBSTRUCTIVE ASTHMA UNSPECIFIED (ICD-493.20) - on ADVAIR 250Bid (but not compliant),  PROAIR HFA Prn (using 2-3 times daily), ATROVENT 2spQid (started by the [facility] clinic in [place]), & MUCINEX 1-2Bid... ~  baseline CXR 11/08 showed sl hyperaeration, clear, NAD.Marland Kitchen. spondylitic changes in TSpine noted. ~  PFT 3/09 showed FVC= 3.36 (92%), FEV1= 2.13 (78%), %1sec= 63, mid-flows= 26% pred;  TLC= 6.61 (112%), RV= 3.25 (145%), RV/TLC= 49%;  DLCO= norm... ~  f/u CXR 8/10 showed min bibasilar atx, NAD... ~  PFT 2/11 showed FVC= 2.22 (73%), FEV1= 1.68 (70%), %1sec=76, mid-flows= 57%pred. ~  4/12:  73mo ROV here & she tells me she is getting her care thru the [facility] in [place]; just wants refills today- declines CXR, labs, etc; she hasn't been taking the Advair due to cost & we discussed regimen w/ ALBUTEROL 2spQid, ATROVENT 2spQid, FLOVENT 220- 2spBid, Singulair 10mg /d, Mucinex 2Bid, fluids, etc... ~  4/13:  Yearly ROV & she remains stable, same meds & followed in [place] as noted... ~  10/13:  Mild exac treated w/ her NEBS Qid, Advair250, Mucinex2Bid, Hycodan cough syrup... ~  7/14: on Allegra/ Flonase, NEBS w/  Albut/Ipratrop Qid, Advair250 (only uses samples due to $$) vs Flovent220-2slBid, Singulair10; ?gets meds in [place] clinic? Asked to add Mucinex-2Bid + fluids.  HYPERTENSION (ICD-401.9) - controlled on ATENOLOL100, HCTZ12.5,  KCl 10-2Bid,  & AMLODIPINE now 10mg /d (from [employer])... ~  labs 8/10 showed K= 3.1, BUN= 10, Creat= 0.7... rec> incr K10 to 2Bid... ~  subseq labs all done in the [facility] clinic she says & they cut her KCl to 1Bid... ~  4/12:  BP today = 138/80 and doing well- denies HA, fatigue, visual changes, CP, palipit, dizziness, syncope, dyspnea, edema, etc... ~  4/13:  BP= 128/82 & she denies CP, palpit, dizzy, SOB, edema, etc... ~  10/13:  BP= 142/74 & she denies CP, palpit, ch in SOB, edema... ~  7/14:  on Aten100, Hct12.5, Amlod5-2tabs/d, & K10-2Bid; BP= 130/74 & she denies CP, palpit, ch in SOB, edema  CHEST PAIN, ATYPICAL (ICD-786.59) - on ASA 81mg /d... she denies recurrent CP, exertional CP, etc... ~  FLP 12/08 by DrVyas at [facility] showed TChol 130, TG 107, HDL 39, LDL 70... ~  pt notes she had 2DEcho at [facility] & will request copies of notes to [place] (never received)...  PALPITATIONS (ICD-785.1) - hx palpitations in the past... no recent symptoms on meds above & she knows to avoid caffeine, etc...  OVERWEIGHT (ICD-278.02) - we reviewed diet + exercise. ~  weight 8/09 =  209# ~  weight 2/10 = 213# ~  weight 8/10 = 206# ~  weight 2/11 = 206# ~  Weight 4/12 = 207# ~  Weight 4/13 = 204# ~  Weight 10/13 = 202# ~  Weight 7/14 = 195#  GERD (ICD-530.81) - on PRILOSEC 20mg Qam,  ZANTAC 300mg Qhs... she has a hx of GERD and LER controlled on these meds...   DIVERTICULOSIS OF COLON (ICD-562.10) - last colonoscopy 7/04 by DrJEdwards- showed divertics only... ~  4/12:  She has some classic IBS-like symptoms & we will try LEVSIN 0.125mg  Tid as needed...  BREAST CANCER >> she noted blood in her bra from nipple disch, initial mammogram in [place] was neg but check up from her primary doctor  ([facility] in [place]) lead to a bx from surg at [facility] w/ Dx of Breast Cancer> Surg- DrMcNutt (initial excision followed by 2nd surg for pos margins), XRT- DrBrown, Oncology- DrSwift now on Tamoxifen & doing very well by her report...   STRESS INCONTINENCE (ICD-788.39) - she claims leakage of urine and stool- she has been recommended to f/u w/ GYN for this eval... ~  2/11:  she reports eval at [facility] in [place] 10/10 but they didn't address leakage... ~  4/12:  She reports that the [facility] clinic doctors have started Rx w/ OXYBUTYNIN 15mg /d for overactive bladder symptoms...  TIA >> on ASA 81mg /d & prev on Plavix as well... ~  6/13: she was seen at the [facility] ER w/ TIA (records pending)... ~  She was referred to her LMD at the [facility], then sent to [facility]- we have requested records...  ANXIETY (ICD-300.00) - states that she is under stress due to her condition and having to care for her mother (on hospice w/ renal failure)... Rx for ALPRAZOLAM 0.25mg  Tid Prn written... ~  4/12:  She reports that [facility] clinic doctors have prescribed TRAZODONE 50mg  Qhs for sleep... ~  4/13:  She notes that everything is stable> on Trazodone & Xanax...   Past Surgical History  Procedure Laterality Date  . Abdominal hysterectomy    . Breast lumpectomy  12/17/2012 & 01/14/2013    Outpatient Encounter Prescriptions as of 02/04/2014  Medication Sig  . albuterol (PROVENTIL) (2.5 MG/3ML) 0.083% nebulizer solution Take 3 mLs (2.5 mg total) by nebulization 3 (three) times daily as needed.  Marland Kitchen albuterol (VENTOLIN HFA) 108 (90 BASE) MCG/ACT inhaler INHALE ONE TO TWO PUFFS INTO LUNGS EVERY 4 TO 6 HOURS AS NEEDED  . ALPRAZolam (XANAX) 0.25 MG tablet Take 2 tablets (0.5 mg total) by mouth 3 (three) times daily as needed for sleep. 1 tablet up to 3 times a day as needed for nerves  . amLODipine (NORVASC) 10 MG tablet Take 5 mg by mouth daily.   . ARTIFICIAL TEARS ophthalmic  solution As needed for dry eyes   . aspirin (BAYER LOW STRENGTH) 81 MG EC tablet Take 81 mg by mouth daily.    Marland Kitchen atenolol (TENORMIN) 100 MG tablet Take 100 mg by mouth daily.  . fexofenadine (ALLEGRA) 180 MG tablet Once a day as needed for allergies   . fluticasone (FLONASE) 50 MCG/ACT nasal spray USE TWO SPRAYS IN EACH NOSTRIL EVERY DAY  . hydrochlorothiazide (HYDRODIURIL) 12.5 MG tablet Take 12.5 mg by mouth daily.  . hyoscyamine (LEVSIN) 0.125 MG tablet Take 1 tablet (0.125 mg total) by mouth 3 (three) times daily as needed for cramping.  Marland Kitchen ipratropium (ATROVENT) 0.02 % nebulizer solution USE ONE  VIAL VIA NEBULIZER THREE TIMES DAILY  . meclizine (ANTIVERT) 25 MG tablet TAKE ONE TABLET BY MOUTH THREE TIMES DAILY AS NEEDED FOR DIZZINESS OR NAUSEA.  . montelukast (SINGULAIR) 10 MG tablet TAKE ONE TABLET BY MOUTH EVERY DAY AT BEDTIME  . potassium chloride (MICRO-K) 10 MEQ CR capsule TAKE TWO CAPSULES BY MOUTH TWICE DAILY  . ranitidine (ZANTAC) 300 MG tablet TAKE ONE TABLET BY MOUTH EVERY DAY AT BEDTIME  . tamoxifen (NOLVADEX) 20 MG tablet Take 20 mg by mouth daily.  . traZODone (DESYREL) 50 MG tablet Take 50 mg by mouth at bedtime. For sleep   . benzonatate (TESSALON) 100 MG capsule Take 2 capsules (200 mg total) by mouth 3 (three) times daily as needed for cough.  . fluticasone (FLOVENT HFA) 220 MCG/ACT inhaler Inhale 2 puffs into the lungs 2 (two) times daily.  . Fluticasone-Salmeterol (ADVAIR) 250-50 MCG/DOSE AEPB Samples only  . oxybutynin (DITROPAN XL) 15 MG 24 hr tablet Take 15 mg by mouth daily.    . promethazine-codeine (PHENERGAN WITH CODEINE) 6.25-10 MG/5ML syrup Take 5 mLs by mouth every 6 (six) hours as needed for cough.  . [DISCONTINUED] azithromycin (ZITHROMAX) 250 MG tablet Take 1 tablet (250 mg total) by mouth as directed.  . [DISCONTINUED] methylPREDNISolone (MEDROL DOSEPAK) 4 MG tablet follow package directions  . [DISCONTINUED] montelukast (SINGULAIR) 10 MG tablet TAKE ONE  TABLET BY MOUTH EVERY DAY AT BEDTIME    No Known Allergies   Review of Systems         See HPI - all other systems neg except as noted... The patient complains of dyspnea on exertion, incontinence, and difficulty walking.  The patient denies anorexia, fever, weight loss, weight gain, vision loss, decreased hearing, hoarseness, chest pain, syncope, peripheral edema, prolonged cough, headaches, hemoptysis, abdominal pain, melena, hematochezia, severe indigestion/heartburn, hematuria, muscle weakness, suspicious skin lesions, transient blindness, depression, unusual weight change, abnormal bleeding, enlarged lymph nodes, and angioedema.     Objective:   Physical Exam     WD, Overweight, 68 y/o BF in NAD... GENERAL:  Alert & oriented; pleasant & cooperative...  HEENT:  [place]/AT, EACs-clear, TMs-wnl, NOSE-clear, THROAT-clear & wnl. NECK:  Supple w/ fairROM; no JVD; normal carotid impulses w/o bruits; no thyromegaly or nodules palpated; no lymphadenopathy. CHEST:  BS diminished BS in bases  without wheezes/ rales/ or rhonchi heard... HEART:  Regular Rhythm; without murmurs/ rubs/ or gallops detected... ABDOMEN:  Obese, soft & nontender; incr bowel sounds; no organomegaly or masses palpated... EXT: without deformities, mild arthritic changes; no varicose veins/ +venous insuffic/ no edema. NEURO:    no focal neuro deficits... DERM:  No lesions noted; no rash etc...   Assessment & Plan:

## 2014-02-04 NOTE — Patient Instructions (Signed)
Omnicef 300mg  Twice daily  For 7 days  Prednisone taper over next week.  Mucinex DM .Twice daily  For cough/congestion  Hydromet 1 tsp every 6hr as needed, may make you sleepy.  Restart Advair 1 puff Twice daily   Follow up Dr. Lenna Gilford  In 3 months and As needed   Please contact office for sooner follow up if symptoms do not improve or worsen or seek emergency care

## 2014-02-04 NOTE — Progress Notes (Signed)
Result Notes    Notes Recorded by Rinaldo Ratel, MA on 02/04/2014 at 5:35 PM Left detailed message on pt's named voicemail informing her of her cxr results. Advised pt will go ahead and send extension of abx to pharmacy and will call her tomorrow morning to verify receipt of this message and to schedule 2 week follow up with SN w/ cxr. Orders only encounter created for Omnicef extension. ------  Notes Recorded by Melvenia Needles, NP on 02/04/2014 at 5:22 PM May have early PNA on right  Would extend [facility] for additonal 3 days for 10 d course  Ov with Dr. Lenna Gilford In 2 weeks with cxr  Please contact office for sooner follow up if symptoms do not improve or worsen or seek emergency care     Called pt's pharmacy, she has yet to pick up the abx from the ov today.  Spoke with pharmacist Sharlet Salina to extend the [facility] for additional 3 days.  Med list updated.

## 2014-02-04 NOTE — Progress Notes (Signed)
Quick Note:  Left detailed message on pt's named voicemail informing her of her cxr results. Advised pt will go ahead and send extension of abx to pharmacy and will call her tomorrow morning to verify receipt of this message and to schedule 2 week follow up with SN w/ cxr. Orders only encounter created for Omnicef extension. ______

## 2014-02-05 NOTE — Progress Notes (Signed)
Quick Note:  Called spoke with patient who stated that she had not received my message from yesterday evening. Discussed cxr results with patient. She just now picked up her abx - advised pt to go ahead and begin this ASAP. 2 week appt scheduled with SN on 5.5.15 @ 1130, to arrive early for cxr. Pt aware to call the office if her symptoms do not improve or worsen for sooner follow up. ______

## 2014-02-18 ENCOUNTER — Ambulatory Visit (INDEPENDENT_AMBULATORY_CARE_PROVIDER_SITE_OTHER): Payer: Medicare HMO | Admitting: Pulmonary Disease

## 2014-02-18 ENCOUNTER — Ambulatory Visit (INDEPENDENT_AMBULATORY_CARE_PROVIDER_SITE_OTHER)
Admission: RE | Admit: 2014-02-18 | Discharge: 2014-02-18 | Disposition: A | Discharge status: Home / Self Care | Payer: Medicare HMO | Source: Ambulatory Visit | Attending: Pulmonary Disease | Admitting: Pulmonary Disease

## 2014-02-18 ENCOUNTER — Encounter: Payer: Self-pay | Admitting: Pulmonary Disease

## 2014-02-18 VITALS — BP 126/78 | HR 78 | Temp 97.4°F | Ht 68.0 in | Wt 204.4 lb

## 2014-02-18 DIAGNOSIS — C50919 Malignant neoplasm of unspecified site of unspecified female breast: Secondary | ICD-10-CM

## 2014-02-18 DIAGNOSIS — J181 Lobar pneumonia, unspecified organism: Principal | ICD-10-CM

## 2014-02-18 DIAGNOSIS — J189 Pneumonia, unspecified organism: Secondary | ICD-10-CM

## 2014-02-18 DIAGNOSIS — J449 Chronic obstructive pulmonary disease, unspecified: Secondary | ICD-10-CM

## 2014-02-18 DIAGNOSIS — G459 Transient cerebral ischemic attack, unspecified: Secondary | ICD-10-CM

## 2014-02-18 DIAGNOSIS — I10 Essential (primary) hypertension: Secondary | ICD-10-CM

## 2014-02-18 DIAGNOSIS — F411 Generalized anxiety disorder: Secondary | ICD-10-CM

## 2014-02-18 DIAGNOSIS — J4489 Other specified chronic obstructive pulmonary disease: Secondary | ICD-10-CM

## 2014-02-18 MED ORDER — FLUTICASONE-SALMETEROL 250-50 MCG/DOSE IN AEPB: 1.0000 | INHALATION_SPRAY | Freq: Two times a day (BID) | RESPIRATORY_TRACT | Status: DC

## 2014-02-18 NOTE — Patient Instructions (Signed)
Today we updated your med list in our EPIC system...    Continue your current medications the same...   We wrote a new prescription for the [id number]- take one inhalation twice daily...  Call for any questions...  Let's plan a follow up visit to recheck your breathing in about 6 months, sooner if needed for problems.Marland KitchenMarland Kitchen

## 2014-02-21 ENCOUNTER — Encounter: Payer: Self-pay | Admitting: Pulmonary Disease

## 2014-02-21 NOTE — Progress Notes (Signed)
Subjective:    Patient ID: Rachael Peck, female    DOB: [birth year], 68 y.o.   MRN: [id number]  HPI 68 y/o BF here for a follow up visit... she has multiple medical problems including:  AR;  Asthma;  HBP;  Hx atypCP & palpit;  Overweight;  GERD;  Divertics;  Stress incontinence;  Anxiety... She lives in [place] and she tells me she is followed for general medical purposes in the [place] was referred to [place] for cardiac eval- we do not have any of these records- she denies recurrent CP, palpit, etc...   ~  February 02, 2011:  14 month ROV & her social situation is the same- living in [place] & receives her general care via the [place], DrCopper, she says they do her blood work as well & she does not want additional labs here today;  Her Asthma remains poorly controlled (she has "episodes every month & goes to the clinic that often), mostly due to medication compliance issues, & it appears that the Advair may not be covered but she is vague about this;  She was given Atrovent inhaler at the Clinic & she feels this helps> we discussed adjusting Rx for least expensive regimen: try Albuterol MDI- 2spQid, Atrovent- 2spQid, & Flovent 220- 2spBid;  We reviewed her current med list in detail & reconciled the list as below...  ~  February 07, 2012:  Year[place] recently saw TP w/ Bronchitis & treated w/ Omnicef, Mucinex, Hydromet;  She tells me that she continues to get her primary care from the [facility];  Her CC today is allergy problems from the pollen & we discussed OTC antihist & nasal saline, etc...     We reviewed prob list, meds, xrays and labs> see below for updates >>  ~  August 06, 2012:  35mo ROV & Rachael Peck notes "this has been one of my best yrs" but states that her Asthma has "kicked up" lately- c/o cough, clear drainage, & some SOB; Rec to add Mucinex-2Bid + Fluids (see below)...  She was seen in the [facility] ER 6/13 w/ a TIA, followed up w/ her [place] & referred to [facility] (placed on ASA/ Plavix) & [facility] (monitor placed looking for AFib) => we do not have notes.    AR/ Asthma> on Allegra/ Flonase, NEBS w/ Albut/Ipratrop Qid, [id number], Singulair10; ?gets meds in [place] clinic? Asked to add Mucinex-2Bid + fluids...    HBP> on Aten100, Hct12.5, Amlod5-2tabs/d, & K10-2Bid; BP= 142/74 & she denies CP, palpit, ch in SOB, edema...    Overweight> "Im watching my diet" and weight = 202# (down 2#) & BMI=31; we reviewed diet & exercise strategies...    GI> GERD, Divertics> on Prilosec20, Zantac300Qhs, & Levsin prn; denies abd pain, n/v, c/d, blood seen...    GU> Stress Incont> on Oxybut15mg /d; she states voiding is better & leaking less...    TIA> seen in ER at [facility] 6/13- f/u w/ Neuro & Cards at [facility]; we have requested records...    Anxiety> on Trazodone50Qhs, Alprazolam0.25mg  prn; meds refilled per request... We reviewed prob list, meds, xrays and labs> see below for updates >> she had the 2013 Flu vaccine 9/13; she requested refill Rxs from [place]- ok... Only labs/ XRays here were in 2010 as she assures me that XRays, EKGs, LABS all done at [facility] (we have requested records)...  ~  May 03, 2013:  79mo ROV &  Rachael Peck reports that since her last visit she noted blood in her bra from nipple disch, initial mammogram was neg but check up from her primary doctor ([facility] in [place]) lead to a bx from surg at [facility] w/ Dx of Breast Cancer> Surg- DrMcNutt (initial excision followed by 2nd surg for pos margins), XRT- DrBrown, Oncology- DrSwift now on Tamoxifen & doing very well by her report;  She has also seen GI- DrConway for diarrhea & she reports that he wanted to do EGD & Colon but she declined & symptom is improving on it's own now- encouraged to discuss this w/ her primary in [place]...    Her breathing has been good- notes min cough, less in the way of nocturnal symptoms, mild DOE but stable she says; no prob w/ her  breathing throughout her cancer Rx...    AR/ Asthma> on Allegra/ Flonase, NEBS w/ Albut/Ipratrop Qid, Advair250 (only uses samples due to $$) vs Flovent220-2slBid, Singulair10; ?gets meds in [place]? Asked to add Mucinex-2Bid + fluids... We reviewed prob list, meds, xrays and labs> see below for updates >>   ~  Feb 18, 2014:  19mo ROV & Rachael Peck saw TP 2 weeks ago w/ cough, yellow sput, weakness & incr SOB; she was using OTC meds, her primary is DrPatel at [facility] clinic; treated w/ Omnicef, Pred taper, Mucinex, & Hydromet; asked to start back on Advair250 as well; she reports much better w/ decr cough, sput has cleared, not tight/ wheezing/ congested, and dyspnea much better; we discussed the need for continuing the Advair250-one inhalation bid & Singulair 10mg /d...    AR/ Asthma> on Allegra/ Flonase, NEBS w/ Albut/Ipratrop prn, Advair250Bid, Singulair10; ?gets meds in [place]? Asked to add Mucinex-2Bid + fluids for congestion...    HBP> on Aten100, Amlod10-1/2, HCT12.5 & K10-2Bid; BP= 126/78 & she denies CP, palpit, ch in SOB, edema...    Overweight> "Im watching my diet" and weight = 204# (no real change) & BMI=31; we reviewed diet & exercise strategies...    GI> GERD, Divertics> on Zantac300Qhs, & Levsin prn; denies abd pain, n/v, c/d, blood seen; she is due for a follow up colonoscopy- encouraged to set this up thru her contacts at [facility]...    GU> Stress Incont> prev on Oxybut but off now; she states voiding is better & leaking less, doesn't want meds...    BREAST CANCER> Dx and treated at [facility] in 2014> Surgery, XRT, Oncology- on Tamoxifen now...    TIA> on ASA81; followed at [facility], she denies cerebral ischemic symptoms...    Anxiety> on Trazodone50Qhs, Alprazolam0.25mg  prn; she is generally stable... We reviewed prob list, meds, xrays and labs> see below for updates >> MEDS AS REPORTED BY PT (there is no CARE EVERYWHERE button on her EPIC chart)...  CXR 5/15 showed improved RLL & no evid of pneumonia  seen, sl peribronch thickening, otherw neg/ NAD...  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   LABS are all done at [facility]          Problem List:   << see prev notes for detail >>   ALLERGIC RHINITIS (ICD-477.9) - on ALLEGRA 180mg /d, & FLONASE Qhs...   CHRONIC OBSTRUCTIVE ASTHMA UNSPECIFIED (ICD-493.20) - on ADVAIR 250Bid (but not compliant),  PROAIR HFA Prn (using 2-3 times daily), ATROVENT 2spQid (started by the [facility] clinic in [place]), & MUCINEX 1-2Bid... ~  baseline CXR 11/08 showed sl hyperaeration, clear, NAD.Marland Kitchen. spondylitic changes in  TSpine noted. ~  PFT 3/09 showed FVC= 3.36 (92%), FEV1= 2.13 (78%), %1sec= 63, mid-flows= 26% pred;  TLC= 6.61 (112%), RV= 3.25 (145%), RV/TLC= 49%;  DLCO= norm... ~  f/u CXR 8/10 showed min bibasilar atx, NAD... ~  PFT 2/11 showed FVC= 2.22 (73%), FEV1= 1.68 (70%), %1sec=76, mid-flows= 57%pred. ~  4/12:  75mo ROV here & she tells me she is getting her care thru the [facility] in [place]; just wants refills today- declines CXR, labs, etc; she hasn't been taking the Advair due to cost & we discussed regimen w/ ALBUTEROL 2spQid, ATROVENT 2spQid, FLOVENT 220- 2spBid, Singulair 10mg /d, Mucinex 2Bid, fluids, etc... ~  4/13:  Year[place] ROV & she remains stable, same meds & followed in [place] as noted... ~  10/13:  Mild exac treated w/ her NEBS Qid, Advair250, Mucinex2Bid, Hycodan cough syrup... ~  7/14: on Allegra/ Flonase, NEBS w/ Albut/Ipratrop Qid, Advair250 (only uses samples due to $$) vs Flovent220-2slBid, Singulair10; ?gets meds in [place]? Asked to add Mucinex-2Bid + fluids. ~  4/15: presented w/ cough, yellow sput, incr SOB; CXR w/ RLL pneum; treated w/ Omnicef, Pred, Mucinex, etc & resolved... ~  5/15: follow up visit w/ resolution of pneumonia; encouraged to use meds regularly: on Allegra/ Flonase, NEBS w/ Albut/Ipratrop prn, Advair250Bid, Singulair10...   HYPERTENSION (ICD-401.9) - controlled on  ATENOLOL100, AMLODIPINE10-1/2, HCTZ12.5, KCl 10-2Bid  CHEST PAIN, ATYPICAL (ICD-786.59) - on ASA 81mg /d... she denies recurrent CP, exertional CP, etc...  PALPITATIONS (ICD-785.1) - hx palpitations in the past... no recent symptoms on meds above & she knows to avoid caffeine, etc...  OVERWEIGHT (ICD-278.02) - we reviewed diet + exercise.  GERD (ICD-530.81) - on ZANTAC 300mg Qhs... she has a hx of GERD and LER controlled on these meds...   DIVERTICULOSIS OF COLON (ICD-562.10) - on Levsin prn; last colonoscopy 7/04 by DrJEdwards- showed divertics only...  BREAST CANCER >> she noted blood in her bra from nipple disch, [place] mamm[place] in [place] was neg but check up from her primary doctor ([facility] in [place]) lead to a bx from surg at [facility] w/ Dx of Breast Cancer> Surg- DrMcNutt (initial excision followed by 2nd surg for pos margins), XRT- DrBrown, Oncology- DrSwift now on Tamoxifen & doing very well by her report...   STRESS INCONTINENCE (ICD-788.39) - she claims leakage of urine and stool- she has been recommended to f/u w/ GYN for this eval....  TIA >> on ASA 81mg /d & prev on Plavix as well...  ANXIETY (ICD-300.00) - states that she is under stress due to her condition and having to care for her mother (on hospice w/ renal failure)... Rx for ALPRAZOLAM 0.25mg  Tid Prn written...   Past Surgical History  Procedure Laterality Date  . Abdominal hysterectomy    . Breast lumpectomy  12/17/2012 & 01/14/2013    Outpatient Encounter Prescriptions as of 02/18/2014  Medication Sig  . albuterol (PROVENTIL) (2.5 MG/3ML) 0.083% nebulizer solution Take 3 mLs (2.5 mg total) by nebulization 3 (three) times daily as needed.  Marland Kitchen albuterol (VENTOLIN HFA) 108 (90 BASE) MCG/ACT inhaler INHALE ONE TO TWO PUFFS INTO LUNGS EVERY 4 TO 6 HOURS AS NEEDED  . ALPRAZolam (XANAX) 0.25 MG tablet Take 2 tablets (0.5 mg total) by mouth 3 (three) times daily as needed for sleep. 1 tablet up to 3 times a day as  needed for nerves  . amLODipine (NORVASC) 10 MG tablet Take 5 mg by mouth daily.   . ARTIFICIAL TEARS ophthalmic solution As needed for  dry eyes   . aspirin (BAYER LOW STRENGTH) 81 MG EC tablet Take 81 mg by mouth daily.    Marland Kitchen atenolol (TENORMIN) 100 MG tablet Take 100 mg by mouth daily.  . benzonatate (TESSALON) 100 MG capsule Take 2 capsules (200 mg total) by mouth 3 (three) times daily as needed for cough.  . fexofenadine (ALLEGRA) 180 MG tablet Once a day as needed for allergies   . fluticasone (FLONASE) 50 MCG/ACT nasal spray USE TWO SPRAYS IN EACH NOSTRIL EVERY DAY  . hydrochlorothiazide (HYDRODIURIL) 12.5 MG tablet Take 12.5 mg by mouth daily.  Marland Kitchen HYDROcodone-homatropine (HYDROMET) 5-1.5 MG/5ML syrup Take 5 mLs by mouth every 6 (six) hours as needed for cough.  . hyoscyamine (LEVSIN) 0.125 MG tablet Take 1 tablet (0.125 mg total) by mouth 3 (three) times daily as needed for cramping.  Marland Kitchen ipratropium (ATROVENT) 0.02 % nebulizer solution USE ONE VIAL VIA NEBULIZER THREE TIMES DAILY  . meclizine (ANTIVERT) 25 MG tablet TAKE ONE TABLET BY MOUTH THREE TIMES DAILY AS NEEDED FOR DIZZINESS OR NAUSEA.  . montelukast (SINGULAIR) 10 MG tablet TAKE ONE TABLET BY MOUTH EVERY DAY AT BEDTIME  . potassium chloride (MICRO-K) 10 MEQ CR capsule TAKE TWO CAPSULES BY MOUTH TWICE DAILY  . ranitidine (ZANTAC) 300 MG tablet TAKE ONE TABLET BY MOUTH EVERY DAY AT BEDTIME  . tamoxifen (NOLVADEX) 20 MG tablet Take 20 mg by mouth daily.  . traZODone (DESYREL) 50 MG tablet Take 50 mg by mouth at bedtime. For sleep   . [DISCONTINUED] fluticasone (FLOVENT HFA) 220 MCG/ACT inhaler Inhale 2 puffs into the lungs 2 (two) times daily.  . Fluticasone-Salmeterol (ADVAIR DISKUS) 250-50 MCG/DOSE AEPB Inhale 1 puff into the lungs 2 (two) times daily.  . [DISCONTINUED] cefdinir (OMNICEF) 300 MG capsule Take 1 capsule (300 mg total) by mouth 2 (two) times daily.  . [DISCONTINUED] Fluticasone-Salmeterol (ADVAIR) 250-50 MCG/DOSE AEPB  Samples only  . [DISCONTINUED] oxybutynin (DITROPAN XL) 15 MG 24 hr tablet Take 15 mg by mouth daily.    . [DISCONTINUED] predniSONE (DELTASONE) 10 MG tablet 4 tabs for 2 days, then 3 tabs for 2 days, 2 tabs for 2 days, then 1 tab for 2 days, then stop  . [DISCONTINUED] promethazine-codeine (PHENERGAN WITH CODEINE) 6.25-10 MG/5ML syrup Take 5 mLs by mouth every 6 (six) hours as needed for cough.    No Known Allergies   Review of Systems         See HPI - all other systems neg except as noted... The patient complains of dyspnea on exertion, incontinence, and difficulty walking.  The patient denies anorexia, fever, weight loss, weight gain, vision loss, decreased hearing, hoarseness, chest pain, syncope, peripheral edema, prolonged cough, headaches, hemoptysis, abdominal pain, melena, hematochezia, severe indigestion/heartburn, hematuria, muscle weakness, suspicious skin lesions, transient blindness, depression, unusual weight change, abnormal bleeding, enlarged lymph nodes, and angioedema.     Objective:   Physical Exam     WD, Overweight, 68 y/o BF in NAD... GENERAL:  Alert & oriented; pleasant & cooperative...  HEENT:  [place]/AT, EOM-wnl, PERRLA, EACs-clear, TMs-wnl, NOSE-clear, THROAT-clear & wnl. NECK:  Supple w/ fairROM; no JVD; normal carotid impulses w/o bruits; no thyromegaly or nodules palpated; no lymphadenopathy. CHEST:  Sl decr BS bilat but clear to P & A; without wheezes/ rales/ or rhonchi heard... HEART:  Regular Rhythm; without murmurs/ rubs/ or gallops detected... ABDOMEN:  Obese, soft & nontender; incr bowel sounds; no organomegaly or masses palpated... EXT: without deformities, mild arthritic changes; no varicose veins/ +  venous insuffic/ no edema. NEURO:  CN's intact;  no focal neuro deficits... DERM:  No lesions noted; no rash etc...  RADIOLOGY DATA:  Reviewed in the EPIC EMR & discussed w/ the patient...  LABORATORY DATA:  Reviewed in the EPIC EMR & discussed w/ the  patient...   Assessment & Plan:               AR & ASTHMA>  Her chronic obstructive asthma has been better regulated w/ meds from the [place] [facility] w/ their samples etc... She would like to continue coming here for her breathing & asthma f/u even though she gets all of her general care, neurology f/u & now cancer f/u in [place] and [facility]... Rec to continue ADVAIR250-Bid, Singulair10, NEBS prn...   << Medical problems followed by her primary care team & specialists at [facility] >>  HBP>   AtypCP & Palpit>  GI>   Breast Cancer>   Stress Incont>  TIA>   Anxiety>     Patient's Medications  New Prescriptions   FLUTICASONE-SALMETEROL (ADVAIR DISKUS) 250-50 MCG/DOSE AEPB    Inhale 1 puff into the lungs 2 (two) times daily.  Previous Medications   ALBUTEROL (PROVENTIL) (2.5 MG/3ML) 0.083% NEBULIZER SOLUTION    Take 3 mLs (2.5 mg total) by nebulization 3 (three) times daily as needed.   ALBUTEROL (VENTOLIN HFA) 108 (90 BASE) MCG/ACT INHALER    INHALE ONE TO TWO PUFFS INTO LUNGS EVERY 4 TO 6 HOURS AS NEEDED   ALPRAZOLAM (XANAX) 0.25 MG TABLET    Take 2 tablets (0.5 mg total) by mouth 3 (three) times daily as needed for sleep. 1 tablet up to 3 times a day as needed for nerves   AMLODIPINE (NORVASC) 10 MG TABLET    Take 5 mg by mouth daily.    ARTIFICIAL TEARS OPHTHALMIC SOLUTION    As needed for dry eyes    ASPIRIN (BAYER LOW STRENGTH) 81 MG EC TABLET    Take 81 mg by mouth daily.     ATENOLOL (TENORMIN) 100 MG TABLET    Take 100 mg by mouth daily.   BENZONATATE (TESSALON) 100 MG CAPSULE    Take 2 capsules (200 mg total) by mouth 3 (three) times daily as needed for cough.   FEXOFENADINE (ALLEGRA) 180 MG TABLET    Once a day as needed for allergies    FLUTICASONE (FLONASE) 50 MCG/ACT NASAL SPRAY    USE TWO SPRAYS IN EACH NOSTRIL EVERY DAY   HYDROCHLOROTHIAZIDE (HYDRODIURIL) 12.5 MG TABLET    Take 12.5 mg by mouth daily.   HYDROCODONE-HOMATROPINE (HYDROMET) 5-1.5 MG/5ML SYRUP    Take  5 mLs by mouth every 6 (six) hours as needed for cough.   HYOSCYAMINE (LEVSIN) 0.125 MG TABLET    Take 1 tablet (0.125 mg total) by mouth 3 (three) times daily as needed for cramping.   IPRATROPIUM (ATROVENT) 0.02 % NEBULIZER SOLUTION    USE ONE VIAL VIA NEBULIZER THREE TIMES DAILY   MECLIZINE (ANTIVERT) 25 MG TABLET    TAKE ONE TABLET BY MOUTH THREE TIMES DAILY AS NEEDED FOR DIZZINESS OR NAUSEA.   MONTELUKAST (SINGULAIR) 10 MG TABLET    TAKE ONE TABLET BY MOUTH EVERY DAY AT BEDTIME   POTASSIUM CHLORIDE (MICRO-K) 10 MEQ CR CAPSULE    TAKE TWO CAPSULES BY MOUTH TWICE DAILY   RANITIDINE (ZANTAC) 300 MG TABLET    TAKE ONE TABLET BY MOUTH EVERY DAY AT BEDTIME   TAMOXIFEN (NOLVADEX) 20 MG TABLET  Take 20 mg by mouth daily.   TRAZODONE (DESYREL) 50 MG TABLET    Take 50 mg by mouth at bedtime. For sleep   Modified Medications   No medications on file  Discontinued Medications   CEFDINIR (OMNICEF) 300 MG CAPSULE    Take 1 capsule (300 mg total) by mouth 2 (two) times daily.   FLUTICASONE (FLOVENT HFA) 220 MCG/ACT INHALER    Inhale 2 puffs into the lungs 2 (two) times daily.   FLUTICASONE-SALMETEROL (ADVAIR) 250-50 MCG/DOSE AEPB    Samples only   OXYBUTYNIN (DITROPAN XL) 15 MG 24 HR TABLET    Take 15 mg by mouth daily.     PREDNISONE (DELTASONE) 10 MG TABLET    4 tabs for 2 days, then 3 tabs for 2 days, 2 tabs for 2 days, then 1 tab for 2 days, then stop   PROMETHAZINE-CODEINE (PHENERGAN WITH CODEINE) 6.25-10 MG/5ML SYRUP    Take 5 mLs by mouth every 6 (six) hours as needed for cough.

## 2014-02-24 ENCOUNTER — Telehealth: Payer: Self-pay | Admitting: Pulmonary Disease

## 2014-02-24 NOTE — Telephone Encounter (Signed)
Notes Recorded by Noralee Space, MD on 02/21/2014 at 11:24 AM Please notify patient>  CXR shows resolution of prev RLL pneumonia, NAD...   I spoke with patient about results and she verbalized understanding and had no questions

## 2014-05-06 ENCOUNTER — Ambulatory Visit: Payer: Medicare HMO | Admitting: Pulmonary Disease

## 2014-05-25 ENCOUNTER — Other Ambulatory Visit: Payer: Self-pay | Admitting: Pulmonary Disease

## 2014-07-15 ENCOUNTER — Telehealth: Payer: Self-pay | Admitting: Pulmonary Disease

## 2014-07-15 MED ORDER — ALBUTEROL SULFATE HFA 108 (90 BASE) MCG/ACT IN AERS: INHALATION_SPRAY | RESPIRATORY_TRACT | Status: DC

## 2014-07-15 MED ORDER — FLUTICASONE PROPIONATE 50 MCG/ACT NA SUSP: NASAL | Status: DC

## 2014-07-15 NOTE — Telephone Encounter (Signed)
Called and spoke with pt and she stated that she is able to get her medications through [facility] for no copay.  She will have them fax over the forms for this.  Will await these faxes and will get these sent in for her.  Pt is aware and nothing further is needed.

## 2014-07-17 ENCOUNTER — Other Ambulatory Visit: Payer: Self-pay | Admitting: *Deleted

## 2014-07-17 MED ORDER — ALBUTEROL SULFATE (2.5 MG/3ML) 0.083% IN NEBU: 2.5000 mg | INHALATION_SOLUTION | Freq: Three times a day (TID) | RESPIRATORY_TRACT | Status: DC | PRN

## 2014-07-17 MED ORDER — RANITIDINE HCL 300 MG PO TABS: ORAL_TABLET | ORAL | Status: DC

## 2014-07-17 MED ORDER — IPRATROPIUM BROMIDE 0.02 % IN SOLN: RESPIRATORY_TRACT | Status: DC

## 2014-07-17 MED ORDER — MONTELUKAST SODIUM 10 MG PO TABS: ORAL_TABLET | ORAL | Status: DC

## 2014-08-21 ENCOUNTER — Encounter: Payer: Self-pay | Admitting: Pulmonary Disease

## 2014-08-21 ENCOUNTER — Ambulatory Visit (INDEPENDENT_AMBULATORY_CARE_PROVIDER_SITE_OTHER): Payer: Medicare HMO | Admitting: Pulmonary Disease

## 2014-08-21 VITALS — BP 126/80 | HR 82 | Temp 98.0°F | Ht 68.0 in | Wt 207.4 lb

## 2014-08-21 DIAGNOSIS — K2100 Gastro-esophageal reflux disease with esophagitis, without bleeding: Secondary | ICD-10-CM

## 2014-08-21 DIAGNOSIS — J302 Other seasonal allergic rhinitis: Secondary | ICD-10-CM

## 2014-08-21 DIAGNOSIS — J449 Chronic obstructive pulmonary disease, unspecified: Secondary | ICD-10-CM

## 2014-08-21 DIAGNOSIS — J4489 Other specified chronic obstructive pulmonary disease: Secondary | ICD-10-CM

## 2014-08-21 DIAGNOSIS — J387 Other diseases of larynx: Secondary | ICD-10-CM

## 2014-08-21 DIAGNOSIS — K219 Gastro-esophageal reflux disease without esophagitis: Secondary | ICD-10-CM

## 2014-08-21 MED ORDER — HYDROCODONE-HOMATROPINE 5-1.5 MG/5ML PO SYRP: 5.0000 mL | ORAL_SOLUTION | Freq: Four times a day (QID) | ORAL | Status: DC | PRN

## 2014-08-21 MED ORDER — FLUTICASONE-SALMETEROL 250-50 MCG/DOSE IN AEPB: 1.0000 | INHALATION_SPRAY | Freq: Two times a day (BID) | RESPIRATORY_TRACT | Status: DC

## 2014-08-21 NOTE — Patient Instructions (Signed)
Today we updated your med list in our EPIC system...    Continue your current medications the same...  Remember to do your Advair & NEBULIZER treatments regularly...  Keep the head of your bed elevated 6+inches...  Do not eat or drink after dinner in the evening...  We will arrange for a GI evaluation for your GERD, reflux and choking episodes w/ a gastroenterologist at [facility]...  Call for any questions...  Let's plan a follow up visit in 42mo, sooner if needed for breathing problems.Marland KitchenMarland Kitchen

## 2014-08-21 NOTE — Progress Notes (Signed)
Subjective:    Patient ID: Rachael Peck, female    DOB: Jul 13, 1946, 68 y.o.   MRN: [id number]  HPI 68 y/o BF here for a follow up visit... she has multiple medical problems including:  AR;  Asthma;  HBP;  Hx atypCP & palpit;  Overweight;  GERD;  Divertics;  Stress incontinence;  Anxiety... She lives in [place] and she tells me she is followed for general medical purposes in the [place] was referred to [facility] for cardiac eval- we do not have any of these records- she denies recurrent CP, palpit, etc...  ~  SEE PREV EPIC NOTES FOR OLDER DATA >>   ~  February 07, 2012:  Yearly ROV & Rachael Peck recently saw TP w/ Bronchitis & treated w/ Omnicef, Mucinex, Hydromet;  She tells me that she continues to get her [facility] from the [facility];  Her CC today is allergy problems from the pollen & we discussed OTC antihist & nasal saline, etc...     We reviewed prob list, meds, xrays and labs> see below for updates >>  ~  August 06, 2012:  68yo ROV & Rachael Peck notes "this has been one of my best yrs" but states that her Asthma has "kicked up" lately- c/o cough, clear drainage, & some SOB; Rec to add Mucinex-2Bid + Fluids (see below)...  She was seen in the [facility] ER 6/13 w/ a TIA, followed up w/ her [place] & referred to [facility] (placed on ASA/ Plavix) & [facility] (monitor placed looking for AFib) => we do not have notes.    AR/ Asthma> on Allegra/ Flonase, NEBS w/ Albut/Ipratrop Qid, [id number], Singulair10; ?gets meds [place]? Asked to add Mucinex-2Bid + fluids...    HBP> on Aten100, Hct12.5, Amlod5-2tabs/d, & K10-2Bid; BP= 142/74 & she denies CP, palpit, ch in SOB, edema...    Overweight> "Im watching my diet" and weight = 202# (down 2#) & BMI=31; we reviewed diet & exercise strategies...    GI> GERD, Divertics> on Prilosec20, Zantac300Qhs, & Levsin prn; denies abd pain, n/v, c/d, blood seen...    GU> Stress Incont> on Oxybut15mg /d; she states voiding is better  & leaking less...    TIA> seen in ER at [facility] 6/13- f/u w/ Neuro & Cards at [facility]; we have requested records...    Anxiety> on Trazodone50Qhs, Alprazolam0.25mg  prn; meds refilled per request... We reviewed prob list, meds, xrays and labs> see below for updates >> she had the 2013 Flu vaccine 9/13; she requested refill Rxs from [place]- ok... Only labs/ XRays here were in 2010 as she assures me that XRays, EKGs, LABS all done at [facility] (we have requested records)...  ~  May 03, 2013:  68yo ROV & Rachael Peck reports that since her last visit she noted blood in her bra from nipple disch, initial mammogram was neg but check up from her primary doctor ([facility] in [place]) lead to a bx from surg at [employer] w/ Dx of Breast Cancer> Surg- DrMcNutt (initial excision followed by 2nd surg for pos margins), XRT- DrBrown, Oncology- DrSwift now on Tamoxifen & doing very well by her report;  She has also seen GI- DrConway for diarrhea & she reports that he wanted to do EGD & Colon but she declined & symptom is improving on it's own now- encouraged to discuss this w/ her primary in [place]...    Her breathing has been good- notes min cough, less in the  way of nocturnal symptoms, mild DOE but stable she says; no prob w/ her breathing throughout her cancer Rx...    AR/ Asthma> on Allegra/ Flonase, NEBS w/ Albut/Ipratrop Qid, Advair250 (only uses samples due to $$) vs Flovent220-2slBid, Singulair10; ?[place] meds in [place]? Asked to add Mucinex-2Bid + fluids... We reviewed prob list, meds, xrays and labs> see below for updates >>   ~  Feb 18, 2014:  68yo ROV & Rachael Peck saw TP 2 weeks ago w/ cough, yellow sput, weakness & incr SOB; she was using OTC meds, her primary is DrPatel at [facility] clinic; treated w/ Omnicef, Pred taper, Mucinex, & Hydromet; asked to start back on Advair250 as well; she reports much better w/ decr cough, sput has cleared, not tight/ wheezing/ congested, and dyspnea much better; we discussed  the need for continuing the Advair250-one inhalation bid & Singulair 10mg /d...    AR/ Asthma> on Allegra/ Flonase, NEBS w/ Albut/Ipratrop prn, Advair250Bid, Singulair10; ?gets meds in [place]? Asked to add Mucinex-2Bid + fluids for congestion...    HBP> on Aten100, Amlod10-1/2, HCT12.5 & K10-2Bid; BP= 126/78 & she denies CP, palpit, ch in SOB, edema...    Overweight> "Im watching my diet" and weight = 204# (no real change) & BMI=31; we reviewed diet & exercise strategies...    GI> GERD, Divertics> on Zantac300Qhs, & Levsin prn; denies abd pain, n/v, c/d, blood seen; she is due for a follow up colonoscopy- encouraged to set this up thru her contacts at [facility]...    GU> Stress Incont> prev on Oxybut but off now; she states voiding is better & leaking less, doesn't want meds...    BREAST CANCER> Dx and treated at [facility] in 2014> Surgery, XRT, Oncology- on Tamoxifen now...    TIA> on ASA81; followed at [facility], she denies cerebral ischemic symptoms...    Anxiety> on Trazodone50Qhs, Alprazolam0.25mg  prn; she is generally stable... We reviewed prob list, meds, xrays and labs> see below for updates >> MEDS AS REPORTED BY PT (there is no CARE EVERYWHERE button on her EPIC chart)...  CXR 5/15 showed improved RLL & no evid of pneumonia seen, sl peribronch thickening, otherw neg/ NAD...  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   LABS are all done at [facility]  ~  August 21, 2014:  68yo ROV and Rachael Peck has been doing reasonably well- but notes recent resp exac w/ choking spells at night assoc w/ cough, chest tightness, some wheezing, "hard to breathe", etc;  I am very suspicious for LPR, reflux related resp exac, & she notes "choking more";  She has been on diet (weight unchanged), taking Advair250Bid, NEBS w/ Duoneb Bid, Singulair10, and using Hycodan/ Tessalon prn;  She also takes [place] regularly;  For the chronic GERD/ reflux she is on ZANTAC300  Qhs, and Levsin0.125mg  prn...  She continues her regular medical f/u in [place] and specialty f/u at [facility]...  Exam shows clear chest, sl decr BS bilat but no rales, rhonchi, or signs of consolidation... Last CXR was 5/15 showing norm heart size, clear lungs x mild incr markings at bases, NAD, mod thoracic spondylosis...     We reviewed prob list, meds, xrays; note- labs all done by her PrimaryCare in [place]>>  PLAN>>  We discussed need for further GI eval of her LPR, choking episodes, etc because they are clearly related to her AB exac, coughing, etc;  Rec referral to [facility] (she has seen them in  the past) for further eval- ?Ba swallow, EGD, etc;  In the meanwhile continue Rx w/ Advair250, NEBS w/ Duoneb, Singulair, and vigorous antireflux regimen...          Problem List:    ALLERGIC RHINITIS (ICD-477.9) - on ALLEGRA 180mg /d, & FLONASE Qhs...   CHRONIC OBSTRUCTIVE ASTHMA UNSPECIFIED (ICD-493.20) - on ADVAIR 250Bid (but not compliant),  PROAIR HFA Prn (using 2-3 times daily), ATROVENT 2spQid (started by the [facility] clinic in [place]), & MUCINEX 1-2Bid... ~  baseline CXR 11/08 showed sl hyperaeration, clear, NAD.Marland Kitchen. spondylitic changes in TSpine noted. ~  PFT 3/09 showed FVC= 3.36 (92%), FEV1= 2.13 (78%), %1sec= 63, mid-flows= 26% pred;  TLC= 6.61 (112%), RV= 3.25 (145%), RV/TLC= 49%;  DLCO= norm... ~  f/u CXR 8/10 showed min bibasilar atx, NAD... ~  PFT 2/11 showed FVC= 2.22 (73%), FEV1= 1.68 (70%), %1sec=76, mid-flows= 57%pred. ~  4/12:  70mo ROV here & she tells me she is getting her care thru the [facility] in [place]; just wants refills today- declines CXR, labs, etc; she hasn't been taking the Advair due to cost & we discussed regimen w/ ALBUTEROL 2spQid, ATROVENT 2spQid, FLOVENT 220- 2spBid, Singulair 10mg /d, Mucinex 2Bid, fluids, etc... ~  4/13:  Yearly ROV & she remains stable, same meds & followed in [place] as noted... ~  10/13:  Mild exac treated w/ her NEBS Qid, Advair250, Mucinex2Bid, Hycodan  cough syrup... ~  7/14: on Allegra/ Flonase, NEBS w/ Albut/Ipratrop Qid, Advair250 (only uses samples due to $$) vs Flovent220-2slBid, Singulair10; ?gets meds in [place]? Asked to add Mucinex-2Bid + fluids. ~  4/15: presented w/ cough, yellow sput, incr SOB; CXR w/ RLL pneum; treated w/ Omnicef, Pred, Mucinex, etc & resolved... ~  5/15: follow up visit w/ resolution of pneumonia; encouraged to use meds regularly: on Allegra/ Flonase, NEBS w/ Albut/Ipratrop prn, Advair250Bid, Singulair10... ~  11/15: f/u visit w/ prominent LPR symptoms, choking, etc; we reviewed antireflux regimen, continue Advair250, NEBS, Singulair10, etc and refer to [facility] [facility] for further eval of her LPR/ choking episodes...   FOLLOWED FOR PRIMARY CARE by [facility] >>   HYPERTENSION (ICD-401.9) - controlled on ATENOLOL100, AMLODIPINE10-1/2, HCTZ12.5, KCl 10-2Bid;  BP= 126/80, we do not have notes from her Primary...  CHEST PAIN, ATYPICAL (ICD-786.59) - on ASA 81mg /d... she denies recurrent CP, exertional CP, etc...  PALPITATIONS (ICD-785.1) - hx palpitations in the past... no recent symptoms on meds above & she knows to avoid caffeine, etc...  OVERWEIGHT (ICD-278.02) - we reviewed diet + exercise; weight = 207# w/ BMI= 31-2...  GERD (ICD-530.81) - on ZANTAC 300mg Qhs... she has a hx of GERD and LPR prev controlled on these meds but now symptoms have returned w/ prominent choking episodes & resp exac...   DIVERTICULOSIS OF COLON (ICD-562.10) - on Levsin prn; last colonoscopy 7/04 by DrJEdwards- showed divertics only; she now gets her GI follow up in [place]...  BREAST CANCER >> she noted blood in her bra from nipple disch, [place] mammogram in [place] was neg but check up from her primary doctor ([facility] in [place]) lead to a bx from surg at [facility] w/ Dx of Breast Cancer> Surg- DrMcNutt (initial excision followed by 2nd surg for pos margins), XRT- DrBrown, Oncology- DrSwift now on Tamoxifen & doing very well by  her report...   STRESS INCONTINENCE (ICD-788.39) - she claims leakage of urine and stool- she has been recommended to f/u w/ GYN for this eval....  TIA >> on ASA 81mg /d &  prev on Plavix as well...  ANXIETY (ICD-300.00) - states that she is under stress due to her condition and having to care for her mother (on hospice w/ renal failure)... Rx for ALPRAZOLAM 0.25mg  Tid Prn written...   Past Surgical History  Procedure Laterality Date  . Abdominal hysterectomy    . Breast lumpectomy  12/17/2012 & 01/14/2013    Outpatient Encounter Prescriptions as of 08/21/2014  Medication Sig  . albuterol (PROVENTIL) (2.5 MG/3ML) 0.083% nebulizer solution Take 3 mLs (2.5 mg total) by nebulization 3 (three) times daily as needed.  Marland Kitchen albuterol (VENTOLIN HFA) 108 (90 BASE) MCG/ACT inhaler INHALE ONE TO TWO PUFFS INTO LUNGS EVERY 4 TO 6 HOURS AS NEEDED  . ALPRAZolam (XANAX) 0.25 MG tablet Take 2 tablets (0.5 mg total) by mouth 3 (three) times daily as needed for sleep. 1 tablet up to 3 times a day as needed for nerves  . amLODipine (NORVASC) 10 MG tablet Take 5 mg by mouth daily.   . ARTIFICIAL TEARS ophthalmic solution As needed for dry eyes   . aspirin (BAYER LOW STRENGTH) 81 MG EC tablet Take 81 mg by mouth daily.    Marland Kitchen atenolol (TENORMIN) 100 MG tablet Take 100 mg by mouth daily.  . benzonatate (TESSALON) 100 MG capsule Take 2 capsules (200 mg total) by mouth 3 (three) times daily as needed for cough.  . fexofenadine (ALLEGRA) 180 MG tablet Once a day as needed for allergies   . fluticasone (FLONASE) 50 MCG/ACT nasal spray USE TWO SPRAYS IN EACH NOSTRIL ONCE DAILY  . Fluticasone-Salmeterol (ADVAIR DISKUS) 250-50 MCG/DOSE AEPB Inhale 1 puff into the lungs 2 (two) times daily.  . hydrochlorothiazide (HYDRODIURIL) 12.5 MG tablet Take 12.5 mg by mouth daily.  Marland Kitchen HYDROcodone-homatropine (HYDROMET) 5-1.5 MG/5ML syrup Take 5 mLs by mouth every 6 (six) hours as needed for cough.  . hyoscyamine (LEVSIN) 0.125 MG tablet  Take 1 tablet (0.125 mg total) by mouth 3 (three) times daily as needed for cramping.  Marland Kitchen ipratropium (ATROVENT) 0.02 % nebulizer solution INHALE ONE VIAL VIA NEBULIZER THREE TIMES DAILY  . meclizine (ANTIVERT) 25 MG tablet TAKE ONE TABLET BY MOUTH THREE TIMES DAILY AS NEEDED FOR DIZZINESS OR NAUSEA.  . montelukast (SINGULAIR) 10 MG tablet TAKE ONE TABLET BY MOUTH EVERY DAY AT BEDTIME  . potassium chloride (MICRO-K) 10 MEQ CR capsule TAKE TWO CAPSULES BY MOUTH TWICE DAILY  . ranitidine (ZANTAC) 300 MG tablet TAKE ONE TABLET BY MOUTH EVERY DAY AT BEDTIME  . tamoxifen (NOLVADEX) 20 MG tablet Take 20 mg by mouth daily.  . traZODone (DESYREL) 50 MG tablet Take 50 mg by mouth at bedtime. For sleep   . [DISCONTINUED] Fluticasone-Salmeterol (ADVAIR DISKUS) 250-50 MCG/DOSE AEPB Inhale 1 puff into the lungs 2 (two) times daily.  . [DISCONTINUED] HYDROcodone-homatropine (HYDROMET) 5-1.5 MG/5ML syrup Take 5 mLs by mouth every 6 (six) hours as needed for cough.    No Known Allergies   Review of Systems         See HPI - all other systems neg except as noted... The patient complains of dyspnea on exertion, incontinence, and difficulty walking.  The patient denies anorexia, fever, weight loss, weight gain, vision loss, decreased hearing, hoarseness, chest pain, syncope, peripheral edema, prolonged cough, headaches, hemoptysis, abdominal pain, melena, hematochezia, severe indigestion/heartburn, hematuria, muscle weakness, suspicious skin lesions, transient blindness, depression, unusual weight change, abnormal bleeding, enlarged lymph nodes, and angioedema.     Objective:   Physical Exam     WD, Overweight,  68 y/o BF in NAD... GENERAL:  Alert & oriented; pleasant & cooperative...  HEENT:  [place]/AT, EOM-wnl, PERRLA, EACs-clear, TMs-wnl, NOSE-clear, THROAT-clear & wnl. NECK:  Supple w/ fairROM; no JVD; normal carotid impulses w/o bruits; no thyromegaly or nodules palpated; no lymphadenopathy. CHEST:  Sl decr  BS bilat but clear to P & A; without wheezes/ rales/ or rhonchi heard... HEART:  Regular Rhythm; without murmurs/ rubs/ or gallops detected... ABDOMEN:  Obese, soft & nontender; incr bowel sounds; no organomegaly or masses palpated... EXT: without deformities, mild arthritic changes; no varicose veins/ +venous insuffic/ no edema. NEURO:  CN's intact;  no focal neuro deficits... DERM:  No lesions noted; no rash etc...  RADIOLOGY DATA:  Reviewed in the EPIC EMR & discussed w/ the patient...  LABORATORY DATA:  Reviewed in the EPIC EMR & discussed w/ the patient...    ASSESSMENT & PLAN:    AR & ASTHMA>  Her chronic obstructive asthma has been better regulated w/ meds from the [place] [facility] w/ their samples etc... She would like to continue coming here for her breathing & asthma f/u even though she gets all of her general care, neurology f/u & now [place] f/u in [place] and [facility]... Rec to continue ADVAIR250-Bid, NEBS w/ Duoneb bid, Singulair10, & needs more vigorous antireflux regimen...  << Medical problems followed by her primary care team & specialists at [facility] >>  HBP>   AtypCP & Palpit>  GI>   Breast Cancer>   Stress Incont>  TIA>   Anxiety>     Patient's Medications  New Prescriptions   No medications on file  Previous Medications   ALBUTEROL (PROVENTIL) (2.5 MG/3ML) 0.083% NEBULIZER SOLUTION    Take 3 mLs (2.5 mg total) by nebulization 3 (three) times daily as needed.   ALBUTEROL (VENTOLIN HFA) 108 (90 BASE) MCG/ACT INHALER    INHALE ONE TO TWO PUFFS INTO LUNGS EVERY 4 TO 6 HOURS AS NEEDED   ALPRAZOLAM (XANAX) 0.25 MG TABLET    Take 2 tablets (0.5 mg total) by mouth 3 (three) times daily as needed for sleep. 1 tablet up to 3 times a day as needed for nerves   AMLODIPINE (NORVASC) 10 MG TABLET    Take 5 mg by mouth daily.    ARTIFICIAL TEARS OPHTHALMIC SOLUTION    As needed for dry eyes    ASPIRIN (BAYER LOW STRENGTH) 81 MG EC TABLET    Take 81 mg by mouth daily.      ATENOLOL (TENORMIN) 100 MG TABLET    Take 100 mg by mouth daily.   BENZONATATE (TESSALON) 100 MG CAPSULE    Take 2 capsules (200 mg total) by mouth 3 (three) times daily as needed for cough.   FEXOFENADINE (ALLEGRA) 180 MG TABLET    Once a day as needed for allergies    FLUTICASONE (FLONASE) 50 MCG/ACT NASAL SPRAY    USE TWO SPRAYS IN EACH NOSTRIL ONCE DAILY   HYDROCHLOROTHIAZIDE (HYDRODIURIL) 12.5 MG TABLET    Take 12.5 mg by mouth daily.   HYOSCYAMINE (LEVSIN) 0.125 MG TABLET    Take 1 tablet (0.125 mg total) by mouth 3 (three) times daily as needed for cramping.   IPRATROPIUM (ATROVENT) 0.02 % NEBULIZER SOLUTION    INHALE ONE VIAL VIA NEBULIZER THREE TIMES DAILY   MECLIZINE (ANTIVERT) 25 MG TABLET    TAKE ONE TABLET BY MOUTH THREE TIMES DAILY AS NEEDED FOR DIZZINESS OR NAUSEA.   MONTELUKAST (SINGULAIR) 10 MG TABLET    TAKE  ONE TABLET BY MOUTH EVERY DAY AT BEDTIME   POTASSIUM CHLORIDE (MICRO-K) 10 MEQ CR CAPSULE    TAKE TWO CAPSULES BY MOUTH TWICE DAILY   RANITIDINE (ZANTAC) 300 MG TABLET    TAKE ONE TABLET BY MOUTH EVERY DAY AT BEDTIME   TAMOXIFEN (NOLVADEX) 20 MG TABLET    Take 20 mg by mouth daily.   TRAZODONE (DESYREL) 50 MG TABLET    Take 50 mg by mouth at bedtime. For sleep   Modified Medications   Modified Medication Previous Medication   FLUTICASONE-SALMETEROL (ADVAIR DISKUS) 250-50 MCG/DOSE AEPB Fluticasone-Salmeterol (ADVAIR DISKUS) 250-50 MCG/DOSE AEPB      Inhale 1 puff into the lungs 2 (two) times daily.    Inhale 1 puff into the lungs 2 (two) times daily.   HYDROCODONE-HOMATROPINE (HYDROMET) 5-1.5 MG/5ML SYRUP HYDROcodone-homatropine (HYDROMET) 5-1.5 MG/5ML syrup      Take 5 mLs by mouth every 6 (six) hours as needed for cough.    Take 5 mLs by mouth every 6 (six) hours as needed for cough.  Discontinued Medications   No medications on file

## 2014-10-07 ENCOUNTER — Telehealth: Payer: Self-pay | Admitting: Pulmonary Disease

## 2014-10-07 MED ORDER — PREDNISONE (PAK) 5 MG PO TABS
ORAL_TABLET | ORAL | Status: DC
Start: 2014-10-07 — End: 2015-08-10

## 2014-10-07 NOTE — Telephone Encounter (Signed)
Called and spoke to pt. Pt c/o recurrent cough with white mucus since thanksgiving, increase in SOB, chills-unsure if febrile, pt doesn't have thermometer, chest tightness x 2weeks. Pt last seen on 08/21/14. Pt stated she is taking the hydromet and it helps lessen the cough.   Dr. Lenna Gilford please advise.   No Known Allergies    Current Outpatient Prescriptions on File Prior to Visit  Medication Sig Dispense Refill  . albuterol (PROVENTIL) (2.5 MG/3ML) 0.083% nebulizer solution Take 3 mLs (2.5 mg total) by nebulization 3 (three) times daily as needed. 810 mL 2  . albuterol (VENTOLIN HFA) 108 (90 BASE) MCG/ACT inhaler INHALE ONE TO TWO PUFFS INTO LUNGS EVERY 4 TO 6 HOURS AS NEEDED 3 each 3  . ALPRAZolam (XANAX) 0.25 MG tablet Take 2 tablets (0.5 mg total) by mouth 3 (three) times daily as needed for sleep. 1 tablet up to 3 times a day as needed for nerves 90 tablet 5  . amLODipine (NORVASC) 10 MG tablet Take 5 mg by mouth daily.     . ARTIFICIAL TEARS ophthalmic solution As needed for dry eyes     . aspirin (BAYER LOW STRENGTH) 81 MG EC tablet Take 81 mg by mouth daily.      Marland Kitchen atenolol (TENORMIN) 100 MG tablet Take 100 mg by mouth daily.    . fexofenadine (ALLEGRA) 180 MG tablet Once a day as needed for allergies     . fluticasone (FLONASE) 50 MCG/ACT nasal spray USE TWO SPRAYS IN EACH NOSTRIL ONCE DAILY 48 g 3  . Fluticasone-Salmeterol (ADVAIR DISKUS) 250-50 MCG/DOSE AEPB Inhale 1 puff into the lungs 2 (two) times daily. 3 each 3  . hydrochlorothiazide (HYDRODIURIL) 12.5 MG tablet Take 12.5 mg by mouth daily.    Marland Kitchen HYDROcodone-homatropine (HYDROMET) 5-1.5 MG/5ML syrup Take 5 mLs by mouth every 6 (six) hours as needed for cough. 180 mL 0  . hyoscyamine (LEVSIN) 0.125 MG tablet Take 1 tablet (0.125 mg total) by mouth 3 (three) times daily as needed for cramping. 90 tablet 11  . ipratropium (ATROVENT) 0.02 % nebulizer solution INHALE ONE VIAL VIA NEBULIZER THREE TIMES DAILY 675 mL 2  . meclizine  (ANTIVERT) 25 MG tablet TAKE ONE TABLET BY MOUTH THREE TIMES DAILY AS NEEDED FOR DIZZINESS OR NAUSEA. 90 tablet 1  . montelukast (SINGULAIR) 10 MG tablet TAKE ONE TABLET BY MOUTH EVERY DAY AT BEDTIME 90 tablet 2  . potassium chloride (MICRO-K) 10 MEQ CR capsule TAKE TWO CAPSULES BY MOUTH TWICE DAILY 120 capsule 1  . ranitidine (ZANTAC) 300 MG tablet TAKE ONE TABLET BY MOUTH EVERY DAY AT BEDTIME 90 tablet 2  . tamoxifen (NOLVADEX) 20 MG tablet Take 20 mg by mouth daily.    . traZODone (DESYREL) 50 MG tablet Take 50 mg by mouth at bedtime. For sleep      No current facility-administered medications on file prior to visit.

## 2014-10-07 NOTE — Telephone Encounter (Signed)
Per SN---  Prednisone dosepak #1  Take as directed otc tylenol as needed Fluids Continue the advair bid Nebs bid Use the hydromet prn for the cough.

## 2014-10-07 NOTE — Telephone Encounter (Signed)
Called, spoke with pt.  Discussed below recs per Dr. Lenna Gilford.  She verbalized understanding, is aware rx sent to [facility] on [facility].  She is to call office back if symptoms do not improve or worsen and is to seek emergency care if needed. Pt verbalized understanding of instructions, is in agreement with plan, and voiced no further questions or concerns at this time.

## 2014-12-01 ENCOUNTER — Telehealth: Payer: Self-pay | Admitting: Pulmonary Disease

## 2014-12-01 NOTE — Telephone Encounter (Signed)
lmomtcb x 1   Need to make sure she wants to use the [facility] pharmacy that is in her chart.

## 2014-12-01 NOTE — Telephone Encounter (Signed)
[id number], pt receives 65mos supply for all meds, please specify 28mos supply on potassium,  [phone]

## 2014-12-02 MED ORDER — POTASSIUM CHLORIDE ER 10 MEQ PO CPCR: 20.0000 meq | ORAL_CAPSULE | Freq: Two times a day (BID) | ORAL | Status: DC

## 2014-12-02 MED ORDER — ALBUTEROL SULFATE (2.5 MG/3ML) 0.083% IN NEBU: 2.5000 mg | INHALATION_SOLUTION | Freq: Three times a day (TID) | RESPIRATORY_TRACT | Status: DC | PRN

## 2014-12-02 MED ORDER — MONTELUKAST SODIUM 10 MG PO TABS: ORAL_TABLET | ORAL | Status: DC

## 2014-12-02 MED ORDER — RANITIDINE HCL 300 MG PO TABS: ORAL_TABLET | ORAL | Status: DC

## 2014-12-02 MED ORDER — IPRATROPIUM BROMIDE 0.02 % IN SOLN: RESPIRATORY_TRACT | Status: DC

## 2014-12-02 NOTE — Telephone Encounter (Signed)
Spoke with patient, verified pharmacy.  90 day supplies of medications requested sent to [facility].  Nothing further needed.

## 2015-03-10 ENCOUNTER — Ambulatory Visit (INDEPENDENT_AMBULATORY_CARE_PROVIDER_SITE_OTHER)
Admission: RE | Admit: 2015-03-10 | Discharge: 2015-03-10 | Disposition: A | Discharge status: Home / Self Care | Payer: Medicare HMO | Source: Ambulatory Visit | Attending: Pulmonary Disease | Admitting: Pulmonary Disease

## 2015-03-10 ENCOUNTER — Ambulatory Visit (INDEPENDENT_AMBULATORY_CARE_PROVIDER_SITE_OTHER): Payer: Medicare HMO | Admitting: Pulmonary Disease

## 2015-03-10 ENCOUNTER — Encounter: Payer: Self-pay | Admitting: Pulmonary Disease

## 2015-03-10 ENCOUNTER — Telehealth: Payer: Self-pay | Admitting: Pulmonary Disease

## 2015-03-10 DIAGNOSIS — J449 Chronic obstructive pulmonary disease, unspecified: Secondary | ICD-10-CM

## 2015-03-10 DIAGNOSIS — J301 Allergic rhinitis due to pollen: Secondary | ICD-10-CM

## 2015-03-10 DIAGNOSIS — K219 Gastro-esophageal reflux disease without esophagitis: Secondary | ICD-10-CM

## 2015-03-10 DIAGNOSIS — R002 Palpitations: Secondary | ICD-10-CM

## 2015-03-10 DIAGNOSIS — J387 Other diseases of larynx: Secondary | ICD-10-CM

## 2015-03-10 MED ORDER — HYDROCODONE-HOMATROPINE 5-1.5 MG/5ML PO SYRP: 5.0000 mL | ORAL_SOLUTION | Freq: Four times a day (QID) | ORAL | Status: DC | PRN

## 2015-03-10 MED ORDER — ALPRAZOLAM 0.25 MG PO TABS: 0.2500 mg | ORAL_TABLET | Freq: Three times a day (TID) | ORAL | Status: DC | PRN

## 2015-03-10 MED ORDER — AZITHROMYCIN 250 MG PO TABS
ORAL_TABLET | ORAL | Status: DC
Start: 2015-03-10 — End: 2015-08-10

## 2015-03-10 MED ORDER — ALPRAZOLAM 0.25 MG PO TABS: 0.5000 mg | ORAL_TABLET | Freq: Three times a day (TID) | ORAL | Status: DC | PRN

## 2015-03-10 MED ORDER — METHYLPREDNISOLONE ACETATE 80 MG/ML IJ SUSP: 80.0000 mg | Freq: Once | INTRAMUSCULAR | Status: DC

## 2015-03-10 MED ORDER — PREDNISONE 20 MG PO TABS: ORAL_TABLET | ORAL | Status: DC

## 2015-03-10 NOTE — Patient Instructions (Signed)
Today we updated your med list in our EPIC system...    Continue your current medications the same...  Today we checked a follow up PFT & CXR...    We will contact you w/ the results when available...   We wrote for a ZPak for the infection and a Pred dosepak for the bronchial inflammation...  You need to take the OTC Prilosec once or twice daily & the Zantac at bedtime...    Be sure to follow up w/ your primary care provider regarding further GI evaluation...  Let's plan a follow up visit in 9mo, sooner if needed for problems.Marland KitchenMarland Kitchen

## 2015-03-10 NOTE — Progress Notes (Signed)
Subjective:    Patient ID: Rachael Peck, female    DOB: 1946-04-26, 69 y.o.   MRN: [id number]  HPI 69 y/o BF here for a follow up visit... she has multiple medical problems including:  AR;  Asthma;  HBP;  Hx atypCP & palpit;  Overweight;  GERD;  Divertics;  Stress incontinence;  Anxiety... She lives in [place] and she tells me she is followed for general medical purposes in the [facility] & was referred to [place] for cardiac eval- we do not have any of these records- she denies recurrent CP, palpit, etc...  ~  SEE PREV EPIC NOTES FOR OLDER DATA >>    Only labs/ XRays here were in 2010 as she assures me that XRays, EKGs, LABS all done at [facility] (we have requested records)...  ~  Feb 18, 2014:  69mo ROV & Rachael Peck saw TP 2 weeks ago w/ cough, yellow sput, weakness & incr SOB; she was using OTC meds, her primary is Rachael Peck at [facility] clinic; treated w/ Omnicef, Pred taper, Mucinex, & Hydromet; asked to start back on Advair250 as well; she reports much better w/ decr cough, sput has cleared, not tight/ wheezing/ congested, and dyspnea much better; we discussed the need for continuing the Advair250-one inhalation bid & Singulair 10mg /d...    AR/ Asthma> on Allegra/ Flonase, NEBS w/ Albut/Ipratrop prn, Advair250Bid, Singulair10; ?[place] med[place]? Asked to add Mucinex-2Bid + fluids for congestion...    HBP> on Aten100, Amlod10-1/2, HCT12.5 & K10-2Bid; BP= 126/78 & she denies CP, palpit, ch in SOB, edema...    Overweight> "Im watching my diet" and weight = 204# (no real change) & BMI=31; we reviewed diet & exercise strategies...    GI> GERD, Divertics> on Zantac300Qhs, & Levsin prn; denies abd pain, n/v, c/d, blood seen; she is due for a follow up colonoscopy- encouraged to set this up thru her contacts at [facility]...    GU> Stress Incont> prev on Oxybut but off now; she states voiding is better & leaking less, doesn't want meds...    BREAST CANCER> Dx and treated at [facility] in  2014> Surgery, XRT, Oncology- on Tamoxifen now...    TIA> on ASA81; followed at [facility], she denies cerebral ischemic symptoms...    Anxiety> on Trazodone50Qhs, Alprazolam0.25mg  prn; she is generally stable... We reviewed prob list, meds, xrays and labs> see below for updates >> MEDS AS REPORTED BY PT (there is no CARE EVERYWHERE button on her EPIC chart)...  CXR 5/15 showed improved RLL & no evid of pneumonia seen, sl peribronch thickening, otherw neg/ NAD...  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   LABS are all done at [facility]  ~  August 21, 2014:  36mo ROV and Rachael Peck has been doing reasonably well- but notes recent resp exac w/ choking spells at night assoc w/ cough, chest tightness, some wheezing, "hard to breathe", etc;  I am very suspicious for LPR, reflux related resp exac, & she notes "choking more";  She has been on diet (weight unchanged), taking Advair250Bid, NEBS w/ Duoneb Bid, Singulair10, and using Hycodan/ Tessalon prn;  She also takes [place] regularly;  For the chronic GERD/ reflux she is on ZANTAC300 Qhs, and Levsin0.125mg  prn...  She continues her regular medical f/u [place] and specialty f/u at [facility]...  Exam shows clear chest, sl decr BS bilat but no rales, rhonchi, or signs of consolidation... Last CXR  was 5/15 showing norm heart size, clear lungs x mild incr markings at bases, NAD, mod thoracic spondylosis...     We reviewed prob list, meds, xrays; note- labs all done by her PrimaryCare in [place]>>  PLAN>>  We discussed need for further GI eval of her LPR, choking episodes, etc because they are clearly related to her AB exac, coughing, etc;  Rec referral to [place] (she has seen them in the past) for further eval- ?Ba swallow, EGD, etc;  In the meanwhile continue Rx w/ Advair250, NEBS w/ Duoneb, Singulair, and vigorous antireflux regimen...   ~  Mar 10, 2015:  76mo ROV & f/u Asthma w/ component of fixed obstructive  dis> at time of her last OV we referred her to [place] (as above) but she never went;  Presents today c/o 3d hx cough, sm amt clear white sput, no hemoptysis, mild SOB & chest discomfort, denies f/c/s and she is vague/ not sure what brought this on/ etc... We reviewed the following medical problems during today's office visit >>     AR/ Asthma> on Allegra/ Flonase, NEBS w/ Albut/Ipratrop prn, Advair250Bid, Singulair10; ?gets meds in [place]? Asked to add Mucinex-2Bid + fluids; 5/16 presents w/ ABexac=> Rx w/ Depo80, dosepak, ZPak...     HBP> on Aten100, Amlod10-1/2, HCT12.5 & K10-2Bid; BP= 134/76 & she denies CP, palpit, ch in SOB, edema...    Overweight> "Im watching my diet" and weight = 205# (no real change) & BMI=31; we reviewed diet & exercise strategies...    GI> GERD, Divertics> on Zantac300Qhs, & Levsin prn; denies abd pain, n/v, c/d, blood seen; she is due for a follow up colonoscopy- encouraged to set this up thru her contacts at [facility]...    GU> Stress Incont> prev on Oxybut but off now; she states voiding is better & leaking less, doesn't want meds...    BREAST CANCER> Dx and treated at [facility] in 2014> Surgery, XRT, Oncology- on Tamoxifen now... NO OLD RECORDS AVAIL IN CareEverywhere?    TIA> on ASA81; followed at [facility], she denies cerebral ischemic symptoms...    Anxiety> on Trazodone50Qhs, Alprazolam0.25mg  prn; she is generally stable... We reviewed prob list, meds, xrays and labs> see below for updates >>   CXR 5/16 showed norm heart size, clear lungs, NAD...   Spirometry 5/16 showed FVC=2.16 (73%), FEV1=1.42 (63%), %1sec=66, mid-flows are reduced at 30% predicted; c/w mod airflow obstruction and GOLD Stage 2 COPD...  IMP/PLAN>>  Rachael Peck appears to have an AB exac, and an element of fixed obstruction due to her poorly controlled asthma over the yrs; Rec to treat w/ Depo80, Pred dosepak, & ZPak at this time; she will continue to use her NEBULIZER w/ Duoneb Tid, Advair250 Bid, Singulair10,  etc...           Problem List:    ALLERGIC RHINITIS (ICD-477.9) - on ALLEGRA 180mg /d, & FLONASE Qhs...   CHRONIC OBSTRUCTIVE ASTHMA UNSPECIFIED (ICD-493.20) - on ADVAIR 250Bid (but not compliant),  PROAIR HFA Prn (using 2-3 times daily), ATROVENT 2spQid (started by the [facility] clinic in [place]), & MUCINEX 1-2Bid... ~  baseline CXR 11/08 showed sl hyperaeration, clear, NAD.Marland Kitchen. spondylitic changes in TSpine noted. ~  PFT 3/09 showed FVC= 3.36 (92%), FEV1= 2.13 (78%), %1sec= 63, mid-flows= 26% pred;  TLC= 6.61 (112%), RV= 3.25 (145%), RV/TLC= 49%;  DLCO= norm... ~  f/u CXR 8/10 showed min bibasilar atx, NAD... ~  PFT 2/11 showed FVC= 2.22 (73%), FEV1= 1.68 (70%), %1sec=76, mid-flows= 57%pred. ~  4/12:  73mo ROV here & she tells me she is getting her care thru the [facility] in [place]; just wants refills today- declines CXR, labs, etc; she hasn't been taking the Advair due to cost & we discussed regimen w/ ALBUTEROL 2spQid, ATROVENT 2spQid, FLOVENT 220- 2spBid, Singulair 10mg /d, Mucinex 2Bid, fluids, etc... ~  4/13:  Yearly ROV & she remains stable, same meds & followed in [place] as noted... ~  10/13:  Mild exac treated w/ her NEBS Qid, Advair250, Mucinex2Bid, Hycodan cough syrup... ~  7/14: on Allegra/ Flonase, NEBS w/ Albut/Ipratrop Qid, Advair250 (only uses samples due to $$) vs Flovent220-2slBid, Singulair10; ?gets meds [place]? Asked to add Mucinex-2Bid + fluids. ~  4/15: presented w/ cough, yellow sput, incr SOB; CXR w/ RLL pneum; treated w/ Omnicef, Pred, Mucinex, etc & resolved... ~  5/15: follow up visit w/ resolution of pneumonia; encouraged to use meds regularly: on Allegra/ Flonase, NEBS w/ Albut/Ipratrop prn, Advair250Bid, Singulair10... ~  11/15: f/u visit w/ prominent LPR symptoms, choking, etc; we reviewed antireflux regimen, continue Advair250, NEBS, Singulair10, etc and refer to [facility] for further eval of her LPR/ choking episodes... ~  5/16: presents w/ AB exac and treated  w/ Depo80, Pred dosepak, ZPak...  ~  CXR 5/16 showed norm heart size, clear lungs, NAD...  ~  Spirometry 5/16 showed FVC=2.16 (73%), FEV1=1.42 (63%), %1sec=66, mid-flows are reduced at 30% predicted; c/w mod airflow obstruction and GOLD Stage 2 COPD   FOLLOWED FOR PRIMARY CARE by [place] [facility] >>   HYPERTENSION (ICD-401.9) - controlled on ATENOLOL100, AMLODIPINE10-1/2, HCTZ12.5, KCl 10-2Bid;  BP= 134/76, we do not have notes from her Primary...  CHEST PAIN, ATYPICAL (ICD-786.59) - on ASA 81mg /d... she denies recurrent CP, exertional CP, etc...  PALPITATIONS (ICD-785.1) - hx palpitations in the past... no recent symptoms on meds above & she knows to avoid caffeine, etc...  OVERWEIGHT (ICD-278.02) - we reviewed diet + exercise; weight = 205# w/ BMI= 31-2...  GERD (ICD-530.81) - on ZANTAC 300mg Qhs... she has a hx of GERD and LPR prev controlled on these meds but now symptoms have returned w/ prominent choking episodes & resp exac...   DIVERTICULOSIS OF COLON (ICD-562.10) - on Levsin prn; last colonoscopy 7/04 by DrJEdwards- showed divertics only; she now gets her GI follow up in [place]...  BREAST CANCER >> she noted blood in her bra from nipple disch, initial mammogram in [place] was neg but check up from her primary doctor ([facility] in [place]) lead to a bx from surg at [facility] w/ Dx of Breast Cancer> Surg- DrMcNutt (initial excision followed by 2nd surg for pos margins), XRT- DrBrown, Oncology- DrSwift now on Tamoxifen & doing very well by her report...   STRESS INCONTINENCE (ICD-788.39) - she claims leakage of urine and stool- she has been recommended to f/u w/ GYN for this eval....  TIA >> on ASA 81mg /d & prev on Plavix as well...  ANXIETY (ICD-300.00) - states that she is under stress due to her condition and having to care for her mother (on hospice w/ renal failure)... Rx for ALPRAZOLAM 0.25mg  Tid Prn written...   Past Surgical History  Procedure Laterality Date  . Abdominal  hysterectomy    . Breast lumpectomy  12/17/2012 & 01/14/2013    Outpatient Encounter Prescriptions as of 03/10/2015  Medication Sig  . albuterol (PROVENTIL) (2.5 MG/3ML) 0.083% nebulizer solution Take 3 mLs (2.5 mg total) by nebulization 3 (three) times daily as needed.  Marland Kitchen albuterol (VENTOLIN HFA)  108 (90 BASE) MCG/ACT inhaler INHALE ONE TO TWO PUFFS INTO LUNGS EVERY 4 TO 6 HOURS AS NEEDED  . ALPRAZolam (XANAX) 0.25 MG tablet Take 2 tablets (0.5 mg total) by mouth 3 (three) times daily as needed for sleep. 1 tablet up to 3 times a day as needed for nerves  . amLODipine (NORVASC) 10 MG tablet Take 5 mg by mouth daily.   . ARTIFICIAL TEARS ophthalmic solution As needed for dry eyes   . aspirin (BAYER LOW STRENGTH) 81 MG EC tablet Take 81 mg by mouth daily.    Marland Kitchen atenolol (TENORMIN) 100 MG tablet Take 100 mg by mouth daily.  . fexofenadine (ALLEGRA) 180 MG tablet Once a day as needed for allergies   . fluticasone (FLONASE) 50 MCG/ACT nasal spray USE TWO SPRAYS IN EACH NOSTRIL ONCE DAILY  . Fluticasone-Salmeterol (ADVAIR DISKUS) 250-50 MCG/DOSE AEPB Inhale 1 puff into the lungs 2 (two) times daily.  . hydrochlorothiazide (HYDRODIURIL) 12.5 MG tablet Take 12.5 mg by mouth daily.  Marland Kitchen HYDROcodone-homatropine (HYDROMET) 5-1.5 MG/5ML syrup Take 5 mLs by mouth every 6 (six) hours as needed for cough.  Marland Kitchen ipratropium (ATROVENT) 0.02 % nebulizer solution INHALE ONE VIAL VIA NEBULIZER THREE TIMES DAILY  . meclizine (ANTIVERT) 25 MG tablet TAKE ONE TABLET BY MOUTH THREE TIMES DAILY AS NEEDED FOR DIZZINESS OR NAUSEA.  . montelukast (SINGULAIR) 10 MG tablet TAKE ONE TABLET BY MOUTH EVERY DAY AT BEDTIME  . potassium chloride (MICRO-K) 10 MEQ CR capsule Take 2 capsules (20 mEq total) by mouth 2 (two) times daily.  . ranitidine (ZANTAC) 300 MG tablet TAKE ONE TABLET BY MOUTH EVERY DAY AT BEDTIME  . tamoxifen (NOLVADEX) 20 MG tablet Take 20 mg by mouth daily.  . traZODone (DESYREL) 50 MG tablet Take 50 mg by mouth at  bedtime. For sleep   . hyoscyamine (LEVSIN) 0.125 MG tablet Take 1 tablet (0.125 mg total) by mouth 3 (three) times daily as needed for cramping.    No Known Allergies   Review of Systems         See HPI - all other systems neg except as noted... The patient complains of dyspnea on exertion, incontinence, and difficulty walking.  The patient denies anorexia, fever, weight loss, weight gain, vision loss, decreased hearing, hoarseness, chest pain, syncope, peripheral edema, prolonged cough, headaches, hemoptysis, abdominal pain, melena, hematochezia, severe indigestion/heartburn, hematuria, muscle weakness, suspicious skin lesions, transient blindness, depression, unusual weight change, abnormal bleeding, enlarged lymph nodes, and angioedema.     Objective:   Physical Exam     WD, Overweight, 69 y/o BF in NAD... GENERAL:  Alert & oriented; pleasant & cooperative...  HEENT:  [place]/AT, EOM-wnl, PERRLA, EACs-clear, TMs-wnl, NOSE-clear, THROAT-clear & wnl. NECK:  Supple w/ fairROM; no JVD; normal carotid impulses w/o bruits; no thyromegaly or nodules palpated; no lymphadenopathy. CHEST:  Sl decr BS bilat w/ scat rhonchi- no wheezing/ rales/ or signs of consolidation... HEART:  Regular Rhythm; without murmurs/ rubs/ or gallops detected... ABDOMEN:  Obese, soft & nontender; incr bowel sounds; no organomegaly or masses palpated... EXT: without deformities, mild arthritic changes; no varicose veins/ +venous insuffic/ no edema. NEURO:  CN's intact;  no focal neuro deficits... DERM:  No lesions noted; no rash etc...  RADIOLOGY DATA:  Reviewed in the EPIC EMR & discussed w/ the patient...  LABORATORY DATA:  Reviewed in the EPIC EMR & discussed w/ the patient...   ASSESSMENT & PLAN:    AR & ASTHMA>  Her chronic obstructive asthma has  been better regulated w/ meds from the [place] [facility] w/ their samples etc... She would like to continue coming here for her breathing & asthma f/u even  though she gets all of her general care, neurology f/u & now [place] f/u in [place] and [facility] (nothing is avail to [place] in [place])... Rec to continue ADVAIR250-Bid, NEBS w/ Duoneb Tid, Singulair10, & needs more vigorous antireflux regimen... 5/16> presented w/ acute exac 7 treated w/ Depo80, Pred dosepak, ZPak...  << Medical problems followed by her primary care team & specialists at [facility] >>  HBP>   AtypCP & Palpit>  GI>   Breast Cancer>   Stress Incont>  TIA>   Anxiety>     Patient's Medications  New Prescriptions   AZITHROMYCIN (ZITHROMAX Z-PAK) 250 MG TABLET    Take as directed   PREDNISONE (DELTASONE) 20 MG TABLET    Take as directed by physician  Previous Medications   ALBUTEROL (PROVENTIL) (2.5 MG/3ML) 0.083% NEBULIZER SOLUTION    Take 3 mLs (2.5 mg total) by nebulization 3 (three) times daily as needed.   ALBUTEROL (VENTOLIN HFA) 108 (90 BASE) MCG/ACT INHALER    INHALE ONE TO TWO PUFFS INTO LUNGS EVERY 4 TO 6 HOURS AS NEEDED   AMLODIPINE (NORVASC) 10 MG TABLET    Take 5 mg by mouth daily.    ARTIFICIAL TEARS OPHTHALMIC SOLUTION    As needed for dry eyes    ASPIRIN (BAYER LOW STRENGTH) 81 MG EC TABLET    Take 81 mg by mouth daily.     ATENOLOL (TENORMIN) 100 MG TABLET    Take 100 mg by mouth daily.   FEXOFENADINE (ALLEGRA) 180 MG TABLET    Once a day as needed for allergies    FLUTICASONE (FLONASE) 50 MCG/ACT NASAL SPRAY    USE TWO SPRAYS IN EACH NOSTRIL ONCE DAILY   FLUTICASONE-SALMETEROL (ADVAIR DISKUS) 250-50 MCG/DOSE AEPB    Inhale 1 puff into the lungs 2 (two) times daily.   HYDROCHLOROTHIAZIDE (HYDRODIURIL) 12.5 MG TABLET    Take 12.5 mg by mouth daily.   HYOSCYAMINE (LEVSIN) 0.125 MG TABLET    Take 1 tablet (0.125 mg total) by mouth 3 (three) times daily as needed for cramping.   IPRATROPIUM (ATROVENT) 0.02 % NEBULIZER SOLUTION    INHALE ONE VIAL VIA NEBULIZER THREE TIMES DAILY   MECLIZINE (ANTIVERT) 25 MG TABLET    TAKE ONE TABLET BY MOUTH THREE TIMES DAILY AS NEEDED FOR  DIZZINESS OR NAUSEA.   MONTELUKAST (SINGULAIR) 10 MG TABLET    TAKE ONE TABLET BY MOUTH EVERY DAY AT BEDTIME   POTASSIUM CHLORIDE (MICRO-K) 10 MEQ CR CAPSULE    Take 2 capsules (20 mEq total) by mouth 2 (two) times daily.   PREDNISONE (STERAPRED UNI-PAK) 5 MG TABS TABLET    Take as directed - 6 day pack   RANITIDINE (ZANTAC) 300 MG TABLET    TAKE ONE TABLET BY MOUTH EVERY DAY AT BEDTIME   TAMOXIFEN (NOLVADEX) 20 MG TABLET    Take 20 mg by mouth daily.   TRAZODONE (DESYREL) 50 MG TABLET    Take 50 mg by mouth at bedtime. For sleep   Modified Medications   Modified Medication Previous Medication   ALPRAZOLAM (XANAX) 0.25 MG TABLET ALPRAZolam (XANAX) 0.25 MG tablet      Take 2 tablets (0.5 mg total) by mouth 3 (three) times daily as needed for sleep. 1 tablet up to 3 times a day as needed for nerves  Take 2 tablets (0.5 mg total) by mouth 3 (three) times daily as needed for sleep. 1 tablet up to 3 times a day as needed for nerves   HYDROCODONE-HOMATROPINE (HYDROMET) 5-1.5 MG/5ML SYRUP HYDROcodone-homatropine (HYDROMET) 5-1.5 MG/5ML syrup      Take 5 mLs by mouth every 6 (six) hours as needed for cough.    Take 5 mLs by mouth every 6 (six) hours as needed for cough.  Discontinued Medications   No medications on file

## 2015-03-10 NOTE — Telephone Encounter (Signed)
Per SN, instructions for xanax needs to be 0.25mg  PO up to three times a day for nerves. Medication error fixed in system. Nothing further is needed.

## 2015-05-19 ENCOUNTER — Other Ambulatory Visit: Payer: Self-pay | Admitting: Pulmonary Disease

## 2015-07-09 ENCOUNTER — Telehealth: Payer: Self-pay | Admitting: Pulmonary Disease

## 2015-07-09 MED ORDER — PREDNISONE 5 MG (21) PO TBPK: 5.0000 mg | ORAL_TABLET | Freq: Every day | ORAL | Status: DC

## 2015-07-09 NOTE — Telephone Encounter (Signed)
lmomtcb for pt 

## 2015-07-09 NOTE — Telephone Encounter (Signed)
Per SN:  He rec pt take mucinex 600 mg 2 po bid, delsym 2 tsp bid for cough, and rx pred dosepak 5 mg 6 day pack take as directed.    Called, spoke with pt.  Discussed above recs per SN.  Pt verbalized understanding, is aware rx sent to [facility] in [facility], and is in agreement with plan.  Pt to call back if symptoms do not improve or worsen.

## 2015-07-09 NOTE — Telephone Encounter (Signed)
Spoke with patient, c/o nonprod cough, nasal congestion X2 weeks-worsening since yesterday.  Denies chest pain, fever.   Pt has been using albuterol neb qid, helps some but not completely.   Pt uses [facility] on [address].    Last ov: 03/10/2015 Next ov: 09/11/15  SN please advise.  Thanks!

## 2015-07-09 NOTE — Telephone Encounter (Signed)
Pt returned call  [phone]

## 2015-08-03 ENCOUNTER — Other Ambulatory Visit: Payer: Self-pay | Admitting: Pulmonary Disease

## 2015-08-04 ENCOUNTER — Other Ambulatory Visit: Payer: Self-pay | Admitting: Pulmonary Disease

## 2015-08-04 MED ORDER — FLUTICASONE PROPIONATE 50 MCG/ACT NA SUSP: NASAL | Status: DC

## 2015-08-04 NOTE — Telephone Encounter (Signed)
Received refill request from [place] to refill pt's flonase Refill approved by SN and sent electronically to pt's pharmacy   Nothing further is needed at this time

## 2015-08-05 ENCOUNTER — Other Ambulatory Visit: Payer: Self-pay | Admitting: Pulmonary Disease

## 2015-08-10 ENCOUNTER — Ambulatory Visit (INDEPENDENT_AMBULATORY_CARE_PROVIDER_SITE_OTHER): Payer: Medicare HMO | Admitting: Pulmonary Disease

## 2015-08-10 ENCOUNTER — Encounter: Payer: Self-pay | Admitting: Pulmonary Disease

## 2015-08-10 VITALS — BP 140/72 | HR 87 | Temp 99.2°F | Wt 199.2 lb

## 2015-08-10 DIAGNOSIS — J387 Other diseases of larynx: Secondary | ICD-10-CM | POA: Diagnosis not present

## 2015-08-10 DIAGNOSIS — F411 Generalized anxiety disorder: Secondary | ICD-10-CM

## 2015-08-10 DIAGNOSIS — K2100 Gastro-esophageal reflux disease with esophagitis, without bleeding: Secondary | ICD-10-CM

## 2015-08-10 DIAGNOSIS — K5730 Diverticulosis of large intestine without perforation or abscess without bleeding: Secondary | ICD-10-CM | POA: Diagnosis not present

## 2015-08-10 DIAGNOSIS — J449 Chronic obstructive pulmonary disease, unspecified: Secondary | ICD-10-CM

## 2015-08-10 DIAGNOSIS — K219 Gastro-esophageal reflux disease without esophagitis: Secondary | ICD-10-CM

## 2015-08-10 DIAGNOSIS — E663 Overweight: Secondary | ICD-10-CM

## 2015-08-10 DIAGNOSIS — J45909 Unspecified asthma, uncomplicated: Secondary | ICD-10-CM

## 2015-08-10 MED ORDER — PANTOPRAZOLE SODIUM 40 MG PO TBEC: 40.0000 mg | DELAYED_RELEASE_TABLET | Freq: Every day | ORAL | Status: DC

## 2015-08-10 MED ORDER — FLUTICASONE-SALMETEROL 500-50 MCG/DOSE IN AEPB
1.0000 | INHALATION_SPRAY | Freq: Two times a day (BID) | RESPIRATORY_TRACT | Status: DC
Start: 2015-08-10 — End: 2015-08-10

## 2015-08-10 MED ORDER — FLUTICASONE-SALMETEROL 500-50 MCG/DOSE IN AEPB: 1.0000 | INHALATION_SPRAY | Freq: Two times a day (BID) | RESPIRATORY_TRACT | Status: DC

## 2015-08-10 MED ORDER — HYDROCODONE-HOMATROPINE 5-1.5 MG/5ML PO SYRP: 5.0000 mL | ORAL_SOLUTION | Freq: Four times a day (QID) | ORAL | Status: DC | PRN

## 2015-08-10 NOTE — Patient Instructions (Signed)
Today we updated your med list in our EPIC system...    Continue your current medications the same...  Continue your NEBULIZER three times daily...   We decided to increase the dose of your ADVAIR from the 250 to the 500 inhaler>    Use the new Advair500- one inhalation twice daily...  For your stomach> we are adding PROTONIX (Pantoprazole) 40mg  take one tab about 30 min before the 1st meal of the day (take it every day)...    Continue the ZANTAC300 at bedtime...  Continue your ACTIVIA Yogurt daily...    Add in one of the ALIGN probiotics every day...  We will help you get set up to see your gastroenterologist ASAP...   Call for any questions...  Let's plan a follow up visit in 36mo, sooner if needed for problems.Marland KitchenMarland Kitchen

## 2015-08-10 NOTE — Progress Notes (Signed)
Subjective:    Patient ID: Rachael Peck, female    DOB: 1946/02/24, 69 y.o.   MRN: [id number]  HPI 69 y/o BF here for a follow up visit... she has multiple medical problems including:  AR;  Asthma;  HBP;  Hx atypCP & palpit;  Overweight;  GERD;  Divertics;  Stress incontinence;  Anxiety... She lives in [place] and she tells me she is followed for general medical purposes in the [facility] & was referred to [place] for cardiac eval- we do not have any of these records- she denies recurrent CP, palpit, etc...  ~  SEE PREV EPIC NOTES FOR OLDER DATA >>    Only labs/ XRays here were in 2010 as she assures me that XRays, EKGs, LABS all done at [facility] (we have requested records)...  ~  Feb 18, 2014:  68mo ROV & Melanie saw TP 2 weeks ago w/ cough, yellow sput, weakness & incr SOB; she was using OTC meds, her primary is DrPatel at [facility] clinic; treated w/ Omnicef, Pred taper, Mucinex, & Hydromet; asked to start back on Advair250 as well; she reports much better w/ decr cough, sput has cleared, not tight/ wheezing/ congested, and dyspnea much better; we discussed the need for continuing the Advair250-one inhalation bid & Singulair 10mg /d...    AR/ Asthma> on Allegra/ Flonase, NEBS w/ Albut/Ipratrop prn, Advair250Bid, Singulair10; ?[place] meds [place]? Asked to add Mucinex-2Bid + fluids for congestion...    HBP> on Aten100, Amlod10-1/2, HCT12.5 & K10-2Bid; BP= 126/78 & she denies CP, palpit, ch in SOB, edema...    Overweight> "Im watching my diet" and weight = 204# (no real change) & BMI=31; we reviewed diet & exercise strategies...    GI> GERD, Divertics> on Zantac300Qhs, & Levsin prn; denies abd pain, n/v, c/d, blood seen; she is due for a follow up colonoscopy- encouraged to set this up thru her contacts at [facility]...    GU> Stress Incont> prev on Oxybut but off now; she states voiding is better & leaking less, doesn't want meds...    BREAST CANCER> Dx and treated at [facility] in  2014> Surgery, XRT, Oncology- on Tamoxifen now...    TIA> on ASA81; followed at [facility], she denies cerebral ischemic symptoms...    Anxiety> on Trazodone50Qhs, Alprazolam0.25mg  prn; she is generally stable... We reviewed prob list, meds, xrays and labs> see below for updates >> MEDS AS REPORTED BY PT (there is no CARE EVERYWHERE button on her EPIC chart)...  CXR 5/15 showed improved RLL & no evid of pneumonia seen, sl peribronch thickening, otherw neg/ NAD...  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   LABS are all done at [facility]  ~  August 21, 2014:  72mo ROV and Rachael Peck has been doing reasonably well- but notes recent resp exac w/ choking spells at night assoc w/ cough, chest tightness, some wheezing, "hard to breathe", etc;  I am very suspicious for LPR, reflux related resp exac, & she notes "choking more";  She has been on diet (weight unchanged), taking Advair250Bid, NEBS w/ Duoneb Bid, Singulair10, and using Hycodan/ Tessalon prn;  She also takes [place] regularly;  For the chronic GERD/ reflux she is on ZANTAC300 Qhs, and Levsin0.125mg  prn...  She continues her regular medical f/u in [place] and specialty f/u at [facility]...  Exam shows clear chest, sl decr BS bilat but no rales, rhonchi, or signs of consolidation... Last CXR  was 5/15 showing norm heart size, clear lungs x mild incr markings at bases, NAD, mod thoracic spondylosis...     We reviewed prob list, meds, xrays; note- labs all done by her PrimaryCare in [place]>>  PLAN>>  We discussed need for further GI eval of her LPR, choking episodes, etc because they are clearly related to her AB exac, coughing, etc;  Rec referral to [facility] (she has seen them in the past) for further eval- ?Ba swallow, EGD, etc;  In the meanwhile continue Rx w/ Advair250, NEBS w/ Duoneb, Singulair, and vigorous antireflux regimen...   ~  Mar 10, 2015:  59mo ROV & f/u Asthma w/ component of fixed obstructive  dis> at time of her last OV we referred her to [place] (as above) but she never went;  Presents today c/o 3d hx cough, sm amt clear white sput, no hemoptysis, mild SOB & chest discomfort, denies f/c/s and she is vague/ not sure what brought this on/ etc... We reviewed the following medical problems during today's office visit >>     AR/ Asthma> on Allegra/ Flonase, NEBS w/ Albut/Ipratrop prn, Advair250Bid, Singulair10; ?[place] meds in [place]? Asked to add Mucinex-2Bid + fluids; 5/16 presents w/ ABexac=> Rx w/ Depo80, dosepak, ZPak...     HBP> on Aten100, Amlod10-1/2, HCT12.5 & K10-2Bid; BP= 134/76 & she denies CP, palpit, ch in SOB, edema...    Overweight> "Im watching my diet" and weight = 205# (no real change) & BMI=31; we reviewed diet & exercise strategies...    GI> GERD, Divertics> on Zantac300Qhs, & Levsin prn; denies abd pain, n/v, c/d, blood seen; she is due for a follow up colonoscopy- encouraged to set this up thru her contacts at [facility]...    GU> Stress Incont> prev on Oxybut but off now; she states voiding is better & leaking less, doesn't want meds...    BREAST CANCER> Dx and treated at [facility] in 2014> Surgery, XRT, Oncology- on Tamoxifen now... NO OLD RECORDS AVAIL IN CareEverywhere?    TIA> on ASA81; followed at [facility], she denies cerebral ischemic symptoms...    Anxiety> on Trazodone50Qhs, Alprazolam0.25mg  prn; she is generally stable... We reviewed prob list, meds, xrays and labs> see below for updates >>   CXR 5/16 showed norm heart size, clear lungs, NAD...   Spirometry 5/16 showed FVC=2.16 (73%), FEV1=1.42 (63%), %1sec=66, mid-flows are reduced at 30% predicted; c/w mod airflow obstruction and GOLD Stage 2 COPD...     IMP/PLAN>>  Maridee appears to have an AB exac, and an element of fixed obstruction due to her poorly controlled asthma over the yrs; Rec to treat w/ Depo80, Pred dosepak, & ZPak at this time; she will continue to use her NEBULIZER w/ Duoneb Tid, [id number] Bid,  Singulair10, etc...   ~  August 10, 2015:  52mo ROV & add-on appt requested by pt> presents c/o feeling bad over the past month, assoc w/ incr cough & mucus; she called & we phoned in Pred which helped; now c/o an "attack" today w/ incr cough, clear phlegm, no hemoptysis, and incr SOB although she is quite vague & unsure of her symptoms- says she had some tightness but "just a little wheezing"; on exam she is in no distress and chest is clear w/o wheezing/ rales/ rhonchi; her abd however is sl distended w/ +epig tenderness on palpation assoc w/ decr BS; she notes recent diarrhea & taking Activia, notes some left sided abd discomfort but denies reflux, dysphagia, n/v, blood seen, etc..Marland Kitchen  AR/ Asthma> on Allegra/ Flonase, NEBS w/ Albut/Ipratrop Bid-Tid, Advair250Bid, Singulair10; ?[place] meds in [place]? Prev asked to add Mucinex-2Bid + fluids; presents 10/16 w/ incr SOB but chest is clear & just finished Pred called in for her; we decided to incr to ADVAIR500-Bid + use the NEBS Tid regularly...    HBP> on Aten100, Amlod10-1/2, HCT12.5 & K10-2Bid; BP= 140/72 & she denies CP, palpit, edema...    Overweight> "Im watching my diet" and weight down 6# to 199# & BMI=30; we reviewed diet & exercise strategies...    GI> GERD, Divertics> on Zantac300Qhs, & Levsin prn; she has epig tender, distention, decr BS; she is due for a follow up colonoscopy- we will refer to her GI in [place] [facility]Conway...    GU> Stress Incont> prev on Oxybut but off now; she states voiding is better & leaking less, doesn't want meds...    BREAST CANCER> Dx and treated at [facility] in 2014> Surgery, XRT, Oncology- on Tamoxifen now... NO OLD RECORDS AVAIL IN CareEverywhere?    TIA> on ASA81; followed at [facility], she denies cerebral ischemic symptoms...    Anxiety> on Trazodone50Qhs, Alprazolam0.25mg  prn; she is quite stressed & asked to take the alpraz0.25 tid to help...  We reviewed prob list, meds, xrays and labs> see below for updates >>     PLAN>>  We  reviewed her prev CXR & spirometry- several interval calls w/ Pred called in for her; currently c/o "attack" today but exam is clear w/o tightness or wheezing; we decided to incr the ADVAIR to the 500 inhaler- still one inhalation Bid & she is encouraged to take the Alpraz0.25 tid to help w/ her nerves;  She alos has on-going GI issues- c/o diarrhea but exam is distended, sl tender, decr BS- encouraged to follow antireflux regimen, add PROTONIX40 Qam, take the Zantac Qhs, add Align to the Activia, and f/u w/ her GI=DrConway in [place]...          Problem List:    ALLERGIC RHINITIS (ICD-477.9) - on ALLEGRA 180mg /d, & FLONASE Qhs...   CHRONIC OBSTRUCTIVE ASTHMA UNSPECIFIED (ICD-493.20) - on ADVAIR 250Bid (but not compliant),  PROAIR HFA Prn (using 2-3 times daily), ATROVENT 2spQid (started by the [facility] clinic in [place]), & MUCINEX 1-2Bid... ~  baseline CXR 11/08 showed sl hyperaeration, clear, NAD.Marland Kitchen. spondylitic changes in TSpine noted. ~  PFT 3/09 showed FVC= 3.36 (92%), FEV1= 2.13 (78%), %1sec= 63, mid-flows= 26% pred;  TLC= 6.61 (112%), RV= 3.25 (145%), RV/TLC= 49%;  DLCO= norm... ~  f/u CXR 8/10 showed min bibasilar atx, NAD... ~  PFT 2/11 showed FVC= 2.22 (73%), FEV1= 1.68 (70%), %1sec=76, mid-flows= 57%pred. ~  4/12:  73mo ROV here & she tells me she is getting her care thru the [facility] in [place]; just wants refills today- declines CXR, labs, etc; she hasn't been taking the Advair due to cost & we discussed regimen w/ ALBUTEROL 2spQid, ATROVENT 2spQid, FLOVENT 220- 2spBid, Singulair 10mg /d, Mucinex 2Bid, fluids, etc... ~  4/13:  Yearly ROV & she remains stable, same meds & followed in [place] as noted... ~  10/13:  Mild exac treated w/ her NEBS Qid, Advair250, Mucinex2Bid, Hycodan cough syrup... ~  7/14: on Allegra/ Flonase, NEBS w/ Albut/Ipratrop Qid, Advair250 (only uses samples due to $$) vs Flovent220-2slBid, Singulair10; ?gets meds in [place]? Asked to add Mucinex-2Bid + fluids. ~  4/15:  presented w/ cough, yellow sput, incr SOB; CXR w/ RLL pneum; treated w/ Omnicef, Pred, Mucinex, etc & resolved... ~  5/15: follow up visit w/ resolution of pneumonia; encouraged to use meds regularly: on Allegra/ Flonase, NEBS w/ Albut/Ipratrop prn, Advair250Bid, Singulair10... ~  11/15: f/u visit w/ prominent LPR symptoms, choking, etc; we reviewed antireflux regimen, continue Advair250, NEBS, Singulair10, etc and refer to [facility] for further eval of her LPR/ choking episodes... ~  5/16: presents w/ AB exac and treated w/ Depo80, Pred dosepak, ZPak...  ~  CXR 5/16 showed norm heart size, clear lungs, NAD...  ~  Spirometry 5/16 showed FVC=2.16 (73%), FEV1=1.42 (63%), %1sec=66, mid-flows are reduced at 30% predicted; c/w mod airflow obstruction and GOLD Stage 2 COPD   FOLLOWED FOR PRIMARY CARE by [place] [facility] >>   HYPERTENSION (ICD-401.9) - controlled on ATENOLOL100, AMLODIPINE10-1/2, HCTZ12.5, KCl 10-2Bid;  BP= 134/76, we do not have notes from her Primary...  CHEST PAIN, ATYPICAL (ICD-786.59) - on ASA 81mg /d... she denies recurrent CP, exertional CP, etc...  PALPITATIONS (ICD-785.1) - hx palpitations in the past... no recent symptoms on meds above & she knows to avoid caffeine, etc...  OVERWEIGHT (ICD-278.02) - we reviewed diet + exercise; weight = 205# w/ BMI= 31-2...  GERD (ICD-530.81) - on ZANTAC 300mg Qhs... she has a hx of GERD and LPR prev controlled on these meds but now symptoms have returned w/ prominent choking episodes & resp exac...   DIVERTICULOSIS OF COLON (ICD-562.10) - on Levsin prn; last colonoscopy 7/04 by DrJEdwards- showed divertics only; she now gets her GI follow up in [place]...  BREAST CANCER >> she noted blood in her bra from nipple disch, initial mammogram in [place] was neg but check up from her primary doctor ([facility] in [place]) lead to a bx from surg at [facility] w/ Dx of Breast Cancer> Surg- DrMcNutt (initial excision followed by 2nd surg for pos  margins), XRT- DrBrown, Oncology- DrSwift now on Tamoxifen & doing very well by her report...   STRESS INCONTINENCE (ICD-788.39) - she claims leakage of urine and stool- she has been recommended to f/u w/ GYN for this eval....  TIA >> on ASA 81mg /d & prev on Plavix as well...  ANXIETY (ICD-300.00) - states that she is under stress due to her condition and having to care for her mother (on hospice w/ renal failure)... Rx for ALPRAZOLAM 0.25mg  Tid Prn written...   Past Surgical History  Procedure Laterality Date  . Abdominal hysterectomy    . Breast lumpectomy  12/17/2012 & 01/14/2013    Outpatient Encounter Prescriptions as of 08/10/2015  Medication Sig  . albuterol (PROVENTIL) (2.5 MG/3ML) 0.083% nebulizer solution INHALE THE CONTENTS OF 1 VIAL VIA NEBULIZER THREE TIMES DAILY AS NEEDED  . albuterol (VENTOLIN HFA) 108 (90 BASE) MCG/ACT inhaler INHALE ONE TO TWO PUFFS INTO LUNGS EVERY 4 TO 6 HOURS AS NEEDED  . ALPRAZolam (XANAX) 0.25 MG tablet Take 1 tablet (0.25 mg total) by mouth 3 (three) times daily as needed.  Marland Kitchen amLODipine (NORVASC) 10 MG tablet Take 5 mg by mouth daily.   . ARTIFICIAL TEARS ophthalmic solution As needed for dry eyes   . aspirin (BAYER LOW STRENGTH) 81 MG EC tablet Take 81 mg by mouth daily.    Marland Kitchen atenolol (TENORMIN) 100 MG tablet Take 100 mg by mouth daily.  . fexofenadine (ALLEGRA) 180 MG tablet Once a day as needed for allergies   . fluticasone (FLONASE) 50 MCG/ACT nasal spray USE TWO SPRAYS IN EACH NOSTRIL ONCE DAILY  . hydrochlorothiazide (HYDRODIURIL) 12.5 MG tablet Take 12.5 mg by mouth daily.  Marland Kitchen HYDROcodone-homatropine (HYDROMET)  5-1.5 MG/5ML syrup Take 5 mLs by mouth every 6 (six) hours as needed for cough.  Marland Kitchen ipratropium (ATROVENT) 0.02 % nebulizer solution INHALE ONE VIAL VIA NEBULIZER THREE TIMES DAILY  . meclizine (ANTIVERT) 25 MG tablet TAKE ONE TABLET BY MOUTH THREE TIMES DAILY AS NEEDED FOR DIZZINESS OR NAUSEA.  . montelukast (SINGULAIR) 10 MG tablet TAKE  1 TABLET EVERY DAY AT BEDTIME  . potassium chloride (MICRO-K) 10 MEQ CR capsule TAKE 2 CAPSULES TWICE DAILY  . ranitidine (ZANTAC) 300 MG tablet TAKE 1 TABLET EVERY DAY AT BEDTIME  . tamoxifen (NOLVADEX) 20 MG tablet Take 20 mg by mouth daily.  .  Fluticasone-Salmeterol (ADVAIR DISKUS) 250-50 MCG/DOSE AEPB Inhale 1 puff into the lungs 2 (two) times daily.  . hyoscyamine (LEVSIN) 0.125 MG tablet Take 1 tablet (0.125 mg total) by mouth 3 (three) times daily as needed for cramping.  . traZODone (DESYREL) 50 MG tablet Take 50 mg by mouth at bedtime. For sleep   . [DISCONTINUED] azithromycin (ZITHROMAX Z-PAK) 250 MG tablet Take as directed  . [DISCONTINUED] predniSONE (DELTASONE) 20 MG tablet Take as directed by physician  . [DISCONTINUED] predniSONE (STERAPRED UNI-PAK 21 TAB) 5 MG (21) TBPK tablet Take 1 tablet (5 mg total) by mouth daily. Take as directed, 6 day pack  . [DISCONTINUED] predniSONE (STERAPRED UNI-PAK) 5 MG TABS tablet Take as directed - 6 day pack (Patient not taking: Reported on 03/10/2015)    No Known Allergies   Review of Systems         See HPI - all other systems neg except as noted... The patient complains of dyspnea on exertion, incontinence, and difficulty walking.  The patient denies anorexia, fever, weight loss, weight gain, vision loss, decreased hearing, hoarseness, chest pain, syncope, peripheral edema, prolonged cough, headaches, hemoptysis, abdominal pain, melena, hematochezia, severe indigestion/heartburn, hematuria, muscle weakness, suspicious skin lesions, transient blindness, depression, unusual weight change, abnormal bleeding, enlarged lymph nodes, and angioedema.     Objective:   Physical Exam     WD, Overweight, 69 y/o BF in NAD... GENERAL:  Alert & oriented; pleasant & cooperative...  HEENT:  [place]/AT, EOM-wnl, PERRLA, EACs-clear, TMs-wnl, NOSE-clear, THROAT-clear & wnl. NECK:  Supple w/ fairROM; no JVD; normal carotid impulses w/o bruits; no thyromegaly or  nodules palpated; no lymphadenopathy. CHEST:  decr BS bilat but clear- no wheezing/ rales/ rhonchi/ or signs of consolidation... HEART:  Regular Rhythm; without murmurs/ rubs/ or gallops detected... ABDOMEN:  Obese, sl distended, decr BS, & tender epig; no organomegaly or masses palpated... EXT: without deformities, mild arthritic changes; no varicose veins/ +venous insuffic/ no edema. NEURO:  CN's intact;  no focal neuro deficits... DERM:  No lesions noted; no rash etc...  RADIOLOGY DATA:  Reviewed in the EPIC EMR & discussed w/ the patient...  LABORATORY DATA:  Reviewed in the EPIC EMR & discussed w/ the patient...   ASSESSMENT & PLAN:    AR & ASTHMA>  Her chronic obstructive asthma has been better regulated w/ meds from the [place] [facility] w/ their samples etc; compliance has been an issue in the past; supposed to be on Advair250Bid, NEBS w/ Albut & Ipratrop Tid, Singulair10, Hycodan prn; she has required occas rounds of antibiotics and Pred recently so we will remind her to use the NEBULIZER w/ both meds Tid regularly, & we will increase her ADVAIr to the 500 inhaler- still one puff Bid  GI> Hx GERD, Divertics> more abd findings than chest findings today- hx diarrhea, distended, decr BS,  epig tender; we will add PPI- Protonix40, continue Zantac300, add Align to her Activia 7 plan f/u appt w/ GI- DrConway [place]...   << Medical problems followed by her primary care team & specialists at [facility] >>  HBP>   AtypCP & Palpit>  Breast Cancer>   Stress Incont>  TIA>   Anxiety>  She needs to take the Alpraz0.25 tid as discussed...   Patient's Medications  New Prescriptions   FLUTICASONE-SALMETEROL (ADVAIR DISKUS) 500-50 MCG/DOSE AEPB    Inhale 1 puff into the lungs 2 (two) times daily.   PANTOPRAZOLE (PROTONIX) 40 MG TABLET    Take 1 tablet (40 mg total) by mouth daily. 30 minutes prior to breakfast  Previous Medications   ALBUTEROL (PROVENTIL) (2.5 MG/3ML) 0.083% NEBULIZER  SOLUTION    INHALE THE CONTENTS OF 1 VIAL VIA NEBULIZER THREE TIMES DAILY AS NEEDED   ALBUTEROL (VENTOLIN HFA) 108 (90 BASE) MCG/ACT INHALER    INHALE ONE TO TWO PUFFS INTO LUNGS EVERY 4 TO 6 HOURS AS NEEDED   ALPRAZOLAM (XANAX) 0.25 MG TABLET    Take 1 tablet (0.25 mg total) by mouth 3 (three) times daily as needed.   AMLODIPINE (NORVASC) 10 MG TABLET    Take 5 mg by mouth daily.    ARTIFICIAL TEARS OPHTHALMIC SOLUTION    As needed for dry eyes    ASPIRIN (BAYER LOW STRENGTH) 81 MG EC TABLET    Take 81 mg by mouth daily.     ATENOLOL (TENORMIN) 100 MG TABLET    Take 100 mg by mouth daily.   FEXOFENADINE (ALLEGRA) 180 MG TABLET    Once a day as needed for allergies    FLUTICASONE (FLONASE) 50 MCG/ACT NASAL SPRAY    USE TWO SPRAYS IN EACH NOSTRIL ONCE DAILY   HYDROCHLOROTHIAZIDE (HYDRODIURIL) 12.5 MG TABLET    Take 12.5 mg by mouth daily.   HYOSCYAMINE (LEVSIN) 0.125 MG TABLET    Take 1 tablet (0.125 mg total) by mouth 3 (three) times daily as needed for cramping.   IPRATROPIUM (ATROVENT) 0.02 % NEBULIZER SOLUTION    INHALE ONE VIAL VIA NEBULIZER THREE TIMES DAILY   MECLIZINE (ANTIVERT) 25 MG TABLET    TAKE ONE TABLET BY MOUTH THREE TIMES DAILY AS NEEDED FOR DIZZINESS OR NAUSEA.   MONTELUKAST (SINGULAIR) 10 MG TABLET    TAKE 1 TABLET EVERY DAY AT BEDTIME   POTASSIUM CHLORIDE (MICRO-K) 10 MEQ CR CAPSULE    TAKE 2 CAPSULES TWICE DAILY   RANITIDINE (ZANTAC) 300 MG TABLET    TAKE 1 TABLET EVERY DAY AT BEDTIME   TAMOXIFEN (NOLVADEX) 20 MG TABLET    Take 20 mg by mouth daily.   TRAZODONE (DESYREL) 50 MG TABLET    Take 50 mg by mouth at bedtime. For sleep   Modified Medications   Modified Medication Previous Medication   HYDROCODONE-HOMATROPINE (HYDROMET) 5-1.5 MG/5ML SYRUP HYDROcodone-homatropine (HYDROMET) 5-1.5 MG/5ML syrup      Take 5 mLs by mouth every 6 (six) hours as needed for cough.    Take 5 mLs by mouth every 6 (six) hours as needed for cough.  Discontinued Medications   AZITHROMYCIN  (ZITHROMAX Z-PAK) 250 MG TABLET    Take as directed   FLUTICASONE-SALMETEROL (ADVAIR DISKUS) 250-50 MCG/DOSE AEPB    Inhale 1 puff into the lungs 2 (two) times daily.   PREDNISONE (DELTASONE) 20 MG TABLET    Take as directed by physician   PREDNISONE (STERAPRED UNI-PAK 21 TAB) 5 MG (21) TBPK TABLET  Take 1 tablet (5 mg total) by mouth daily. Take as directed, 6 day pack   PREDNISONE (STERAPRED UNI-PAK) 5 MG TABS TABLET    Take as directed - 6 day pack

## 2015-08-11 ENCOUNTER — Other Ambulatory Visit: Payer: Self-pay | Admitting: Pulmonary Disease

## 2015-08-11 MED ORDER — ALPRAZOLAM 0.25 MG PO TABS: 0.2500 mg | ORAL_TABLET | Freq: Three times a day (TID) | ORAL | Status: DC | PRN

## 2015-08-12 ENCOUNTER — Other Ambulatory Visit: Payer: Self-pay | Admitting: *Deleted

## 2015-08-12 MED ORDER — ALBUTEROL SULFATE HFA 108 (90 BASE) MCG/ACT IN AERS: INHALATION_SPRAY | RESPIRATORY_TRACT | Status: DC

## 2015-09-02 ENCOUNTER — Telehealth: Payer: Self-pay | Admitting: Pulmonary Disease

## 2015-09-02 MED ORDER — FLUTICASONE PROPIONATE 50 MCG/ACT NA SUSP: NASAL | Status: DC

## 2015-09-02 MED ORDER — POTASSIUM CHLORIDE 20 MEQ/15ML (10%) PO SOLN: 20.0000 meq | Freq: Two times a day (BID) | ORAL | Status: DC

## 2015-09-02 NOTE — Telephone Encounter (Signed)
Per SN>>Ok to refill both medications. Change Potassium to KCL elixir 20MeQ PO BID with 30 day supply and prn refills on both medications  LM for pt to call office back to discuss

## 2015-09-02 NOTE — Telephone Encounter (Signed)
Called spoke with pt. Reviewed SN's recs. Verified pharmacy as [facility]. Rx was sent to pharmacy. Pt voiced understanding and had no further questions. Nothing further needed. Will sign off on message.

## 2015-09-02 NOTE — Telephone Encounter (Signed)
Needs refills sent to [facility] mail order for Flonase and Potassium 74meq.  These will need to be sent after speaking with SN about a concern the patient has.  Pt requesting a liquid form of Potassium 29meq - has trouble swallowing the pill.  Please advise Dr Lenna Gilford. Thanks.     Medication List       This list is accurate as of: 09/02/15  9:49 AM.  Always use your most recent med list.               albuterol (2.5 MG/3ML) 0.083% nebulizer solution  Commonly known as:  PROVENTIL  INHALE THE CONTENTS OF 1 VIAL VIA NEBULIZER THREE TIMES DAILY AS NEEDED     albuterol 108 (90 BASE) MCG/ACT inhaler  Commonly known as:  VENTOLIN HFA  INHALE ONE TO TWO PUFFS INTO LUNGS EVERY 4 TO 6 HOURS AS NEEDED     ALPRAZolam 0.25 MG tablet  Commonly known as:  XANAX  Take 1 tablet (0.25 mg total) by mouth 3 (three) times daily as needed.     amLODipine 10 MG tablet  Commonly known as:  NORVASC  Take 5 mg by mouth daily.     ARTIFICIAL TEARS ophthalmic solution  As needed for dry eyes     atenolol 100 MG tablet  Commonly known as:  TENORMIN  Take 100 mg by mouth daily.     BAYER LOW STRENGTH 81 MG EC tablet  Generic drug:  aspirin  Take 81 mg by mouth daily.     fexofenadine 180 MG tablet  Commonly known as:  ALLEGRA  Once a day as needed for allergies     fluticasone 50 MCG/ACT nasal spray  Commonly known as:  FLONASE  USE TWO SPRAYS IN EACH NOSTRIL ONCE DAILY     Fluticasone-Salmeterol 500-50 MCG/DOSE Aepb  Commonly known as:  ADVAIR DISKUS  Inhale 1 puff into the lungs 2 (two) times daily.     hydrochlorothiazide 12.5 MG tablet  Commonly known as:  HYDRODIURIL  Take 12.5 mg by mouth daily.     HYDROcodone-homatropine 5-1.5 MG/5ML syrup  Commonly known as:  HYDROMET  Take 5 mLs by mouth every 6 (six) hours as needed for cough.     hyoscyamine 0.125 MG tablet  Commonly known as:  LEVSIN  Take 1 tablet (0.125 mg total) by mouth 3 (three) times daily as needed for cramping.      ipratropium 0.02 % nebulizer solution  Commonly known as:  ATROVENT  INHALE ONE VIAL VIA NEBULIZER THREE TIMES DAILY     meclizine 25 MG tablet  Commonly known as:  ANTIVERT  TAKE ONE TABLET BY MOUTH THREE TIMES DAILY AS NEEDED FOR DIZZINESS OR NAUSEA.     montelukast 10 MG tablet  Commonly known as:  SINGULAIR  TAKE 1 TABLET EVERY DAY AT BEDTIME     pantoprazole 40 MG tablet  Commonly known as:  PROTONIX  Take 1 tablet (40 mg total) by mouth daily. 30 minutes prior to breakfast     potassium chloride 10 MEQ CR capsule  Commonly known as:  MICRO-K  TAKE 2 CAPSULES TWICE DAILY     ranitidine 300 MG tablet  Commonly known as:  ZANTAC  TAKE 1 TABLET EVERY DAY AT BEDTIME     tamoxifen 20 MG tablet  Commonly known as:  NOLVADEX  Take 20 mg by mouth daily.     traZODone 50 MG tablet  Commonly known as:  DESYREL  Take 50 mg  by mouth at bedtime. For sleep

## 2015-09-02 NOTE — Telephone Encounter (Signed)
Patient Returned call [phone]

## 2015-09-11 ENCOUNTER — Ambulatory Visit: Payer: Medicare HMO | Admitting: Pulmonary Disease

## 2015-09-16 ENCOUNTER — Ambulatory Visit: Payer: Medicare HMO | Admitting: Pulmonary Disease

## 2015-09-29 ENCOUNTER — Other Ambulatory Visit: Payer: Self-pay | Admitting: Pulmonary Disease

## 2015-09-30 ENCOUNTER — Other Ambulatory Visit: Payer: Self-pay | Admitting: Pulmonary Disease

## 2015-10-11 ENCOUNTER — Other Ambulatory Visit: Payer: Self-pay | Admitting: Pulmonary Disease

## 2015-10-16 ENCOUNTER — Other Ambulatory Visit: Payer: Self-pay | Admitting: Pulmonary Disease

## 2015-11-03 ENCOUNTER — Telehealth: Payer: Self-pay | Admitting: Pulmonary Disease

## 2015-11-03 MED ORDER — POTASSIUM CHLORIDE ER 10 MEQ PO CPCR: 20.0000 meq | ORAL_CAPSULE | Freq: Two times a day (BID) | ORAL | Status: DC

## 2015-11-03 NOTE — Telephone Encounter (Signed)
Per SN- okay to call in k10 mg caps 2 po bid  Rx was sent and left detailed msg for the pt

## 2015-11-03 NOTE — Telephone Encounter (Signed)
I called spoke with pt. She reports she was never able to afford the liquid potassium elixer bc the copay is $280 for this. She is requesting to go back to the potassium capsules. 2 caps BID sent to [employer] order. Please advise NS if okay to do so? thanks

## 2016-01-23 ENCOUNTER — Other Ambulatory Visit: Payer: Self-pay | Admitting: Pulmonary Disease

## 2016-02-09 ENCOUNTER — Ambulatory Visit: Payer: Medicare HMO | Admitting: Pulmonary Disease

## 2016-02-17 ENCOUNTER — Other Ambulatory Visit: Payer: Self-pay | Admitting: Pulmonary Disease

## 2016-03-09 ENCOUNTER — Other Ambulatory Visit: Payer: Self-pay | Admitting: Pulmonary Disease

## 2016-03-11 ENCOUNTER — Other Ambulatory Visit: Payer: Self-pay | Admitting: Pulmonary Disease

## 2016-03-17 ENCOUNTER — Telehealth: Payer: Self-pay | Admitting: Pulmonary Disease

## 2016-03-17 MED ORDER — POTASSIUM CHLORIDE ER 10 MEQ PO CPCR: 20.0000 meq | ORAL_CAPSULE | Freq: Two times a day (BID) | ORAL | Status: DC

## 2016-03-17 MED ORDER — PANTOPRAZOLE SODIUM 40 MG PO TBEC: DELAYED_RELEASE_TABLET | ORAL | Status: DC

## 2016-03-17 MED ORDER — IPRATROPIUM BROMIDE 0.02 % IN SOLN: RESPIRATORY_TRACT | Status: DC

## 2016-03-17 MED ORDER — ALPRAZOLAM 0.25 MG PO TABS
0.2500 mg | ORAL_TABLET | Freq: Three times a day (TID) | ORAL | Status: DC | PRN
Start: 2016-03-17 — End: 2019-01-21

## 2016-03-17 NOTE — Telephone Encounter (Signed)
Spoke with pt. She needs refills on Alprazolam, Potassium, Protonix and ipratropium neb solution sent to [facility]. We sent all of these on 03/11/16 but per the pt, [employer] did not receive them. All of these will be sent again. Alprazolam will be faxed. Nothing further was needed.

## 2016-03-29 ENCOUNTER — Encounter: Payer: Self-pay | Admitting: Pulmonary Disease

## 2016-03-29 ENCOUNTER — Ambulatory Visit (INDEPENDENT_AMBULATORY_CARE_PROVIDER_SITE_OTHER): Payer: Medicare HMO | Admitting: Pulmonary Disease

## 2016-03-29 ENCOUNTER — Ambulatory Visit (INDEPENDENT_AMBULATORY_CARE_PROVIDER_SITE_OTHER)
Admission: RE | Admit: 2016-03-29 | Discharge: 2016-03-29 | Disposition: A | Discharge status: Home / Self Care | Payer: Medicare HMO | Source: Ambulatory Visit | Attending: Pulmonary Disease | Admitting: Pulmonary Disease

## 2016-03-29 VITALS — BP 124/82 | HR 86 | Temp 98.6°F | Ht 68.0 in | Wt 198.6 lb

## 2016-03-29 DIAGNOSIS — J387 Other diseases of larynx: Secondary | ICD-10-CM

## 2016-03-29 DIAGNOSIS — J45909 Unspecified asthma, uncomplicated: Secondary | ICD-10-CM

## 2016-03-29 DIAGNOSIS — J449 Chronic obstructive pulmonary disease, unspecified: Secondary | ICD-10-CM

## 2016-03-29 DIAGNOSIS — K219 Gastro-esophageal reflux disease without esophagitis: Secondary | ICD-10-CM

## 2016-03-29 DIAGNOSIS — J309 Allergic rhinitis, unspecified: Secondary | ICD-10-CM

## 2016-03-29 DIAGNOSIS — E663 Overweight: Secondary | ICD-10-CM

## 2016-03-29 DIAGNOSIS — F411 Generalized anxiety disorder: Secondary | ICD-10-CM

## 2016-03-29 MED ORDER — IPRATROPIUM BROMIDE 0.02 % IN SOLN: RESPIRATORY_TRACT | Status: DC

## 2016-03-29 MED ORDER — MONTELUKAST SODIUM 10 MG PO TABS: 10.0000 mg | ORAL_TABLET | Freq: Every day | ORAL | Status: DC

## 2016-03-29 MED ORDER — FLUTICASONE-SALMETEROL 500-50 MCG/DOSE IN AEPB: 1.0000 | INHALATION_SPRAY | Freq: Two times a day (BID) | RESPIRATORY_TRACT | Status: DC

## 2016-03-29 MED ORDER — ALBUTEROL SULFATE HFA 108 (90 BASE) MCG/ACT IN AERS: INHALATION_SPRAY | RESPIRATORY_TRACT | Status: DC

## 2016-03-29 MED ORDER — TRAMADOL HCL 50 MG PO TABS: 50.0000 mg | ORAL_TABLET | Freq: Three times a day (TID) | ORAL | Status: DC | PRN

## 2016-03-29 MED ORDER — FLUTICASONE PROPIONATE 50 MCG/ACT NA SUSP: NASAL | Status: DC

## 2016-03-29 MED ORDER — ALBUTEROL SULFATE (2.5 MG/3ML) 0.083% IN NEBU: 2.5000 mg | INHALATION_SOLUTION | Freq: Two times a day (BID) | RESPIRATORY_TRACT | Status: DC

## 2016-03-29 MED ORDER — FEXOFENADINE HCL 180 MG PO TABS: 180.0000 mg | ORAL_TABLET | Freq: Every day | ORAL | Status: DC

## 2016-03-29 NOTE — Patient Instructions (Signed)
.  Today we updated your med list in our EPIC system...    Continue your current medications the same...  We wrote a new prescription for TRAMADOL 50mg  take one tab up to 3 times daily as needed for cough as directed...  Today we did your follow up CXR...    We will contact you w/ the results when available...   We will refill your breathing meds as discussed...  Call for any questions...  Let's plan a follow up visit in 12mo, sooner if needed for problems.Marland KitchenMarland Kitchen

## 2016-03-29 NOTE — Progress Notes (Signed)
Subjective:    Patient ID: Rachael Peck, female    DOB: 1946/01/20, 70 y.o.   MRN: [id number]  HPI 70 y/o BF here for a follow up visit... she has multiple medical problems including:  AR;  Asthma;  HBP;  Hx atypCP & palpit;  Overweight;  GERD;  Divertics;  Stress incontinence;  Anxiety... She lives in [place] and she tells me she is followed for general medical purposes in the [facility] & was referred to [place] for cardiac eval- we do not have any of these records- she denies recurrent CP, palpit, etc...  ~  SEE PREV EPIC NOTES FOR OLDER DATA >>    Only labs/ XRays here were in 2010 as she assures me that XRays, EKGs, LABS all done at [facility] (we have requested records)...  CXR 5/15 showed improved RLL & no evid of pneumonia seen, sl peribronch thickening, otherw neg/ NAD...  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   LABS are all done at [facility]  ~  August 21, 2014:  12mo ROV and Rachael Peck has been doing reasonably well- but notes recent resp exac w/ choking spells at night assoc w/ cough, chest tightness, some wheezing, "hard to breathe", etc;  I am very suspicious for LPR, reflux related resp exac, & she notes "choking more";  She has been on diet (weight unchanged), taking Advair250Bid, NEBS w/ Duoneb Bid, Singulair10, and using Hycodan/ Tessalon prn;  She also takes Radom regularly;  For the chronic GERD/ reflux she is on ZANTAC300 Qhs, and Levsin0.125mg  prn...  She [place] her regular [place] f[place]u [place] and specialty f/u at [facility]...  Exam shows clear chest, sl decr BS bilat but no rales, rhonchi, or signs of consolidation... Last CXR was 5/15 showing norm heart size, clear lungs x mild incr markings at bases, NAD, mod thoracic spondylosis...     We reviewed prob list, meds, xrays; note- labs all done by her PrimaryCare in [place]>>  PLAN>>  We discussed need for further GI eval of her LPR,  choking episodes, etc because they are clearly related to her AB exac, coughing, etc;  Rec referral to [place] (she has seen them in the past) for further eval- ?Ba swallow, EGD, etc;  In the meanwhile continue Rx w/ Advair250, NEBS w/ Duoneb, Singulair, and vigorous antireflux regimen...   ~  Mar 10, 2015:  60mo ROV & f/u Asthma w/ component of fixed obstructive dis> at time of her last OV we referred her to [place] (as above) but she never went;  Presents today c/o 3d hx cough, sm amt clear white sput, no hemoptysis, mild SOB & chest discomfort, denies f/c/s and she is vague/ not sure what brought this on/ etc... We reviewed the following medical problems during today's office visit >>     AR/ Asthma> on Allegra/ Flonase, NEBS w/ Albut/Ipratrop prn, Advair250Bid, Singulair10; ?[place]? Asked to add Mucinex-2Bid + fluids; 5/16 presents w/ ABexac=> Rx w/ Depo80, dosepak, ZPak...     HBP> on Aten100, Amlod10-1/2, HCT12.5 & K10-2Bid; BP= 134/76 & she denies CP, palpit, ch in SOB, edema...    Overweight> "Im watching my diet" and weight = 205# (no real change) & BMI=31; we reviewed diet & exercise strategies...    GI> GERD, Divertics> on Zantac300Qhs, & Levsin prn; denies abd pain, n/v, c/d, blood seen; she is due for a follow up  colonoscopy- encouraged to set this up thru her contacts at [facility]...    GU> Stress Incont> prev on Oxybut but off now; she states voiding is better & leaking less, doesn't want meds...    BREAST CANCER> Dx and treated at [facility] in 2014> Surgery, XRT, Oncology- on Tamoxifen now... NO OLD RECORDS AVAIL IN CareEverywhere?    TIA> on ASA81; followed at [facility], she denies cerebral ischemic symptoms...    Anxiety> on Trazodone50Qhs, Alprazolam0.25mg  prn; she is generally stable... We reviewed prob list, meds, xrays and labs> see below for updates >>   CXR 5/16 showed norm heart size, clear lungs, NAD...   Spirometry 5/16 showed FVC=2.16 (73%), FEV1=1.42 (63%), %1sec=66,  mid-flows are reduced at 30% predicted; c/w mod airflow obstruction and GOLD Stage 2 COPD...     IMP/PLAN>>  Rachael Peck appears to have an AB exac, and an element of fixed obstruction due to her poorly controlled asthma over the yrs; Rec to treat w/ Depo80, Pred dosepak, & ZPak at this time; she will continue to use her NEBULIZER w/ Duoneb Tid, NOIBBC488 Bid, Singulair10, etc...   ~  August 10, 2015:  53mo ROV & add-on appt requested by pt> presents c/o feeling bad over the past month, assoc w/ incr cough & mucus; she called & we phoned in Pred which helped; now c/o an "attack" today w/ incr cough, clear phlegm, no hemoptysis, and incr SOB although she is quite vague & unsure of her symptoms- says she had some tightness but "just a little wheezing"; on exam she is in no distress and chest is clear w/o wheezing/ rales/ rhonchi; her abd however is sl distended w/ +epig tenderness on palpation assoc w/ decr BS; she notes recent diarrhea & taking Activia, notes some left sided abd discomfort but denies reflux, dysphagia, n/v, blood seen, etc...     AR/ Asthma> on Allegra/ Flonase, NEBS w/ Albut/Ipratrop Bid-Tid, Advair250Bid, Singulair10; ?[place]? Prev asked to add Mucinex-2Bid + fluids; presents 10/16 w/ incr SOB but chest is clear & just finished Pred called in for her; we decided to incr to ADVAIR500-Bid + use the NEBS Tid regularly...    HBP> on Aten100, Amlod10-1/2, HCT12.5 & K10-2Bid; BP= 140/72 & she denies CP, palpit, edema...    Overweight> "Im watching my diet" and weight down 6# to 199# & BMI=30; we reviewed diet & exercise strategies...    GI> GERD, Divertics> on Zantac300Qhs, & Levsin prn; she has epig tender, distention, decr BS; she is due for a follow up colonoscopy- we will refer to her GI in [place] [facility]Conway...    GU> Stress Incont> prev on Oxybut but off now; she states voiding is better & leaking less, doesn't want meds...    BREAST CANCER> Dx and treated at [facility] in 2014> Surgery,  XRT, Oncology- on Tamoxifen now... NO OLD RECORDS AVAIL IN CareEverywhere?    TIA> on ASA81; followed at [facility], she denies cerebral ischemic symptoms...    Anxiety> on Trazodone50Qhs, Alprazolam0.25mg  prn; she is quite stressed & asked to take the alpraz0.25 tid to help...  We reviewed prob list, meds, xrays and labs> see below for updates >>     PLAN>>  We reviewed her prev CXR & spirometry- several interval calls w/ Pred called in for her; currently c/o "attack" today but exam is clear w/o tightness or wheezing; we decided to incr the ADVAIR to the 500 inhaler- still one inhalation Bid & she is encouraged to take the Alpraz0.25 tid to help  w/ her nerves;  She alos has on-going GI issues- c/o diarrhea but exam is distended, sl tender, decr BS- encouraged to follow antireflux regimen, add PROTONIX40 Qam, take the Zantac Qhs, add Align to the Activia, and f/u w/ her GI=DrConway in [place]...  ~  March 29, 2016:  7-67mo ROV & Rachael Peck returns for a pulmonary follow up visit>  She tells me that she has recently been house-bound due to the springtime/ pollen/ trees and notes coughing and "resp attacks";  She has been taking Advair500Bid, Singulair10, NEBS w/ Albut & Ipratropium Bid, VentolinHFA (using it 3-4 times daily), plus her Allegra180 & Flonase=> and she notes very joyously that "all this has brought me back to life" but she is requesting a regular cough medication (we discussed trial TRAMADOL 50mg  Tid prn);  She has not required any ER visits/ hospitalizations/ nor has she called here or [facility] for additional meds/ steroids/ etc... 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     12/30/15> she was seen in the [facility] clinic by DrAvery & notes to be doing well on her Tamoxifen, few hot flashes, note reviewed- exam was OK, Labs OK, Mammogram OK, & they plan ROV 48mo...    11/26/15> she was seen in the GI clinic by [facility] for [facility], GERD, LPR; on Protonix40, Zantac300, +lifestyle modifications and she was much improved; they plan recheck in 37mo 7 try to get her to reduce the PPI dose... EXAM shows Afeb, VSS, O2sat=100% on RA;  HEENT- neg, mallampati2;  Chest- clerar w/o w/r/r;  Heart- RR w/o m/r/g;  Abd- soft, nontender, neg;  Ext- neg w/o c/c/e;  Neuro- intact...   LABS 12/30/15 at [facility]>  Chems- wnl;  CBC- wnl w/ Hg=12.4, MCV=86, WBC=3.9  Mammogram 02/04/16 at [facility]>  Benign, scat fibroglandular densities, no suspicious mass or calcif seen, s/p lumpectomy on left  CXR 03/29/16>  Norm heart size, clear lungs- NAD, post op changes in left breast area... IMP/PLAN>>  Despite her intermittent symptoms- Rachael Peck appears stable from the pulmonary standpoint & hasn't required ER visits, hospitalizations, office phone calls for additional meds, etc; she is pleased w/ her current med regimen & just wants to continue this the same=> meds refilled today & new Rx for TRAMADOL50mg Tid to try for her cough... We plan ROV in 63mo, sooner if needed for acute problems...           Problem List:    ALLERGIC RHINITIS (ICD-477.9) - on ALLEGRA 180mg /d, & FLONASE Qhs...   CHRONIC OBSTRUCTIVE ASTHMA UNSPECIFIED (ICD-493.20) - on ADVAIR 250Bid (but not compliant),  PROAIR HFA Prn (using 2-3 times daily), ATROVENT 2spQid (started by the [facility] in [place]), & MUCINEX 1-2Bid... ~  baseline CXR 11/08 showed sl hyperaeration, clear, NAD.Marland Kitchen.  spondylitic changes in TSpine noted. ~  PFT 3/09 showed FVC= 3.36 (92%), FEV1= 2.13 (78%), %1sec= 63, mid-flows= 26% pred;  TLC=  6.61 (112%), RV= 3.25 (145%), RV/TLC= 49%;  DLCO= norm... ~  f/u CXR 8/10 showed min bibasilar atx, NAD... ~  PFT 2/11 showed FVC= 2.22 (73%), FEV1= 1.68 (70%), %1sec=76, mid-flows= 57%pred. ~  4/12:  30mo ROV here & she tells me she is getting her care thru the [facility] in [place]; just wants refills today- declines CXR, labs, etc; she hasn't been taking the Advair due to cost & we discussed regimen w/ ALBUTEROL 2spQid, ATROVENT 2spQid, FLOVENT 220- 2spBid, Singulair 10mg /d, Mucinex 2Bid, fluids, etc... ~  4/13:  Yearly ROV & she remains stable, same meds & followed in [place] as noted... ~  10/13:  Mild exac treated w/ her NEBS Qid, Advair250, Mucinex2Bid, Hycodan cough syrup... ~  7/14: on Allegra/ Flonase, NEBS w/ Albut/Ipratrop Qid, Advair250 (only uses samples due to $$) vs Flovent220-2slBid, Singulair10; ?gets meds in [place]? Asked to add Mucinex-2Bid + fluids. ~  4/15: presented w/ cough, yellow sput, incr SOB; CXR w/ RLL pneum; treated w/ Omnicef, Pred, Mucinex, etc & resolved... ~  5/15: follow up visit w/ resolution of pneumonia; encouraged to use meds regularly: on Allegra/ Flonase, NEBS w/ Albut/Ipratrop prn, Advair250Bid, Singulair10... ~  11/15: f/u visit w/ prominent LPR symptoms, choking, etc; we reviewed antireflux regimen, continue Advair250, NEBS, Singulair10, etc and refer to [facility] for further eval of her LPR/ choking episodes... ~  5/16: presents w/ AB exac and treated w/ Depo80, Pred dosepak, ZPak...  ~  CXR 5/16 showed norm heart size, clear lungs, NAD...  ~  Spirometry 5/16 showed FVC=2.16 (73%), FEV1=1.42 (63%), %1sec=66, mid-flows are reduced at 30% predicted; c/w mod airflow obstruction and GOLD Stage 2 COPD   FOLLOWED FOR PRIMARY CARE by [place] [facility] >>   HYPERTENSION (ICD-401.9) - controlled on ATENOLOL100, AMLODIPINE10-1/2,  HCTZ12.5, KCl 10-2Bid;  BP= 134/76, we do not have notes from her Primary...  CHEST PAIN, ATYPICAL (ICD-786.59) - on ASA 81mg /d... she denies recurrent CP, exertional CP, etc...  PALPITATIONS (ICD-785.1) - hx palpitations in the past... no recent symptoms on meds above & she knows to avoid caffeine, etc...  OVERWEIGHT (ICD-278.02) - we reviewed diet + exercise; weight = 205# w/ BMI= 31-2...  GERD (ICD-530.81) - on ZANTAC 300mg Qhs... she has a hx of GERD and LPR prev controlled on these meds but now symptoms have returned w/ prominent choking episodes & resp exac...   DIVERTICULOSIS OF COLON (ICD-562.10) - on Levsin prn; last colonoscopy 7/04 by DrJEdwards- showed divertics only; she now gets her GI follow up in [place]...  BREAST CANCER >> she noted blood in her bra from nipple disch, initial mammogram in [place] was neg but check up from her primary doctor ([facility] in [place]) lead to a bx from surg at [facility] w/ Dx of Breast Cancer> Surg- DrMcNutt (initial excision followed by 2nd surg for pos margins), XRT- DrBrown, Oncology- DrSwift now on Tamoxifen & doing very well by her report...  ~  See notes in Care Everywhere=> reviewed...  STRESS INCONTINENCE (ICD-788.39) - she claims leakage of urine and stool- she has been recommended to f/u w/ GYN for this eval....  TIA >> on ASA 81mg /d & prev on Plavix as well...  ANXIETY (ICD-300.00) - states that she is under stress due to her condition and having to care for her mother (on hospice w/ renal failure)... Rx for ALPRAZOLAM 0.25mg  Tid Prn written...   Past Surgical History  Procedure Laterality Date  . Abdominal hysterectomy    . Breast  lumpectomy  12/17/2012 & 01/14/2013    Outpatient Encounter Prescriptions as of 03/29/2016  Medication Sig  . albuterol (PROVENTIL) (2.5 MG/3ML) 0.083% nebulizer solution Take 3 mLs (2.5 mg total) by nebulization 2 (two) times daily.  Marland Kitchen albuterol (VENTOLIN HFA) 108 (90 Base) MCG/ACT inhaler INHALE ONE  TO TWO PUFFS INTO LUNGS EVERY 4 TO 6 HOURS AS NEEDED  . ALPRAZolam (XANAX) 0.25 MG tablet Take 1 tablet (0.25 mg total) by mouth 3 (three) times daily as needed.  Marland Kitchen amLODipine (NORVASC) 10 MG tablet Take 5 mg by mouth daily.   . ARTIFICIAL TEARS ophthalmic solution As needed for dry eyes   . aspirin (BAYER LOW STRENGTH) 81 MG EC tablet Take 81 mg by mouth daily.    Marland Kitchen atenolol (TENORMIN) 100 MG tablet Take 100 mg by mouth daily.  . cholecalciferol (VITAMIN D) 1000 units tablet Take 1,000 Units by mouth daily.  . fexofenadine (ALLEGRA) 180 MG tablet Take 1 tablet (180 mg total) by mouth daily. Once a day as needed for allergies  . fluticasone (FLONASE) 50 MCG/ACT nasal spray USE TWO SPRAYS IN EACH NOSTRIL ONCE DAILY  . Fluticasone-Salmeterol (ADVAIR DISKUS) 500-50 MCG/DOSE AEPB Inhale 1 puff into the lungs 2 (two) times daily.  . hydrochlorothiazide (HYDRODIURIL) 12.5 MG tablet Take 12.5 mg by mouth daily.  . hyoscyamine (LEVSIN) 0.125 MG tablet Take 1 tablet (0.125 mg total) by mouth 3 (three) times daily as needed for cramping.  Marland Kitchen ipratropium (ATROVENT) 0.02 % nebulizer solution INHALE THE CONTENTS OF 1 VIAL VIA NEBULIZER THREE TIMES DAILY  . loratadine (CLARITIN) 10 MG tablet Take 10 mg by mouth daily as needed.  . meclizine (ANTIVERT) 25 MG tablet TAKE ONE TABLET BY MOUTH THREE TIMES DAILY AS NEEDED FOR DIZZINESS OR NAUSEA.  . montelukast (SINGULAIR) 10 MG tablet Take 1 tablet (10 mg total) by mouth at bedtime.  . pantoprazole (PROTONIX) 40 MG tablet TAKE 1 TABLET DAILY  30 MINUTES PRIOR TO BREAKFAST  . potassium chloride (MICRO-K) 10 MEQ CR capsule Take 2 capsules (20 mEq total) by mouth 2 (two) times daily.  . ranitidine (ZANTAC) 300 MG tablet TAKE 1 TABLET AT BEDTIME  . tamoxifen (NOLVADEX) 20 MG tablet Take 20 mg by mouth daily.  . traZODone (DESYREL) 50 MG tablet Take 50 mg by mouth at bedtime. For sleep   . vitamin B-12 (CYANOCOBALAMIN) 1000 MCG tablet Take 1,000 mcg by mouth daily.  Marland Kitchen  HYDROcodone-homatropine (HYDROMET) 5-1.5 MG/5ML syrup Take 5 mLs by mouth every 6 (six) hours as needed for cough. (Patient not taking: Reported on 03/29/2016)    No Known Allergies   Review of Systems         See HPI - all other systems neg except as noted... The patient complains of dyspnea on exertion, incontinence, and difficulty walking.  The patient denies anorexia, fever, weight loss, weight gain, vision loss, decreased hearing, hoarseness, chest pain, syncope, peripheral edema, prolonged cough, headaches, hemoptysis, abdominal pain, melena, hematochezia, severe indigestion/heartburn, hematuria, muscle weakness, suspicious skin lesions, transient blindness, depression, unusual weight change, abnormal bleeding, enlarged lymph nodes, and angioedema.     Objective:   Physical Exam     WD, Overweight, 70 y/o BF in NAD... GENERAL:  Alert & oriented; pleasant & cooperative...  HEENT:  [place]/AT, EOM-wnl, PERRLA, EACs-clear, TMs-wnl, NOSE-clear, THROAT-clear & wnl. NECK:  Supple w/ fairROM; no JVD; normal carotid impulses w/o bruits; no thyromegaly or nodules palpated; no lymphadenopathy. CHEST:  decr BS bilat but clear- no wheezing/ rales/ rhonchi/  or signs of consolidation... HEART:  Regular Rhythm; without murmurs/ rubs/ or gallops detected... ABDOMEN:  Obese, sl distended, decr BS, & tender epig; no organomegaly or masses palpated... EXT: without deformities, mild arthritic changes; no varicose veins/ +venous insuffic/ no edema. NEURO:  CN's intact;  no focal neuro deficits... DERM:  No lesions noted; no rash etc...  RADIOLOGY DATA:  Reviewed in the EPIC EMR & discussed w/ the patient...  LABORATORY DATA:  Reviewed in the EPIC EMR & discussed w/ the patient...   ASSESSMENT & PLAN:    AR & ASTHMA>  Her chronic obstructive asthma has been better regulated w/ meds from [facility] [place] [facility] w/ their samples etc; compliance has been an issue in the past; supposed to be on  Advair250Bid, NEBS w/ Albut & Ipratrop Tid, Singulair10, Hycodan prn; she has required occas rounds of antibiotics and Pred recently so we will remind her to use the NEBULIZER w/ both meds Tid regularly, & we will increase her ADVAIr to the 500 inhaler- still one puff Bid  GI> Hx GERD, Divertics> more abd findings than chest findings today- hx diarrhea, distended, decr BS, epig tender; we will add PPI- Protonix40, continue Zantac300, add Align to her Activia 7 plan f/u appt w/ GI- DrConway [place]...   << Medical problems followed by her primary care team & specialists at [facility] >>  HBP>   AtypCP & Palpit>  Breast Cancer>   Stress Incont>  TIA>   Anxiety>  She needs to take the Alpraz0.25 tid as discussed...   Patient's Medications  New Prescriptions   TRAMADOL (ULTRAM) 50 MG TABLET    Take 1 tablet (50 mg total) by mouth 3 (three) times daily as needed (cough).  Previous Medications   ALPRAZOLAM (XANAX) 0.25 MG TABLET    Take 1 tablet (0.25 mg total) by mouth 3 (three) times daily as needed.   AMLODIPINE (NORVASC) 10 MG TABLET    Take 5 mg by mouth daily.    ARTIFICIAL TEARS OPHTHALMIC SOLUTION    As needed for dry eyes    ASPIRIN (BAYER LOW STRENGTH) 81 MG EC TABLET    Take 81 mg by mouth daily.     ATENOLOL (TENORMIN) 100 MG TABLET    Take 100 mg by mouth daily.   CHOLECALCIFEROL (VITAMIN D) 1000 UNITS TABLET    Take 1,000 Units by mouth daily.   FLUTICASONE-SALMETEROL (ADVAIR DISKUS) 500-50 MCG/DOSE AEPB    Inhale 1 puff into the lungs 2 (two) times daily.   HYDROCHLOROTHIAZIDE (HYDRODIURIL) 12.5 MG TABLET    Take 12.5 mg by mouth daily.   HYDROCODONE-HOMATROPINE (HYDROMET) 5-1.5 MG/5ML SYRUP    Take 5 mLs by mouth every 6 (six) hours as needed for cough.   HYOSCYAMINE (LEVSIN) 0.125 MG TABLET    Take 1 tablet (0.125 mg total) by mouth 3 (three) times daily as needed for cramping.   LORATADINE (CLARITIN) 10 MG TABLET    Take 10 mg by mouth daily as needed.   MECLIZINE (ANTIVERT) 25 MG  TABLET    TAKE ONE TABLET BY MOUTH THREE TIMES DAILY AS NEEDED FOR DIZZINESS OR NAUSEA.   PANTOPRAZOLE (PROTONIX) 40 MG TABLET    TAKE 1 TABLET DAILY  30 MINUTES PRIOR TO BREAKFAST   POTASSIUM CHLORIDE (MICRO-K) 10 MEQ CR CAPSULE    Take 2 capsules (20 mEq total) by mouth 2 (two) times daily.   RANITIDINE (ZANTAC) 300 MG TABLET    TAKE 1 TABLET AT BEDTIME  TAMOXIFEN (NOLVADEX) 20 MG TABLET    Take 20 mg by mouth daily.   TRAZODONE (DESYREL) 50 MG TABLET    Take 50 mg by mouth at bedtime. For sleep    VITAMIN B-12 (CYANOCOBALAMIN) 1000 MCG TABLET    Take 1,000 mcg by mouth daily.  Modified Medications   Modified Medication Previous Medication   ALBUTEROL (PROVENTIL) (2.5 MG/3ML) 0.083% NEBULIZER SOLUTION albuterol (PROVENTIL) (2.5 MG/3ML) 0.083% nebulizer solution      Take 3 mLs (2.5 mg total) by nebulization 2 (two) times daily.    INHALE THE CONTENTS OF 1 VIAL VIA NEBULIZER THREE TIMES DAILY AS NEEDED   ALBUTEROL (VENTOLIN HFA) 108 (90 BASE) MCG/ACT INHALER albuterol (VENTOLIN HFA) 108 (90 BASE) MCG/ACT inhaler      INHALE ONE TO TWO PUFFS INTO LUNGS EVERY 4 TO 6 HOURS AS NEEDED    INHALE ONE TO TWO PUFFS INTO LUNGS EVERY 4 TO 6 HOURS AS NEEDED   FEXOFENADINE (ALLEGRA) 180 MG TABLET fexofenadine (ALLEGRA) 180 MG tablet      Take 1 tablet (180 mg total) by mouth daily. Once a day as needed for allergies    Once a day as needed for allergies    FLUTICASONE (FLONASE) 50 MCG/ACT NASAL SPRAY fluticasone (FLONASE) 50 MCG/ACT nasal spray      USE TWO SPRAYS IN EACH NOSTRIL ONCE DAILY    USE TWO SPRAYS IN EACH NOSTRIL ONCE DAILY   FLUTICASONE-SALMETEROL (ADVAIR DISKUS) 500-50 MCG/DOSE AEPB Fluticasone-Salmeterol (ADVAIR DISKUS) 500-50 MCG/DOSE AEPB      Inhale 1 puff into the lungs 2 (two) times daily.    Inhale 1 puff into the lungs 2 (two) times daily.   IPRATROPIUM (ATROVENT) 0.02 % NEBULIZER SOLUTION ipratropium (ATROVENT) 0.02 % nebulizer solution      INHALE THE CONTENTS OF 1 VIAL VIA NEBULIZER  THREE TIMES DAILY    INHALE THE CONTENTS OF 1 VIAL VIA NEBULIZER THREE TIMES DAILY   MONTELUKAST (SINGULAIR) 10 MG TABLET montelukast (SINGULAIR) 10 MG tablet      Take 1 tablet (10 mg total) by mouth at bedtime.    TAKE 1 TABLET AT BEDTIME  Discontinued Medications   FLUTICASONE (FLONASE) 50 MCG/ACT NASAL SPRAY    USE TWO SPRAYS IN EACH NOSTRIL ONCE DAILY

## 2016-03-31 NOTE — Progress Notes (Signed)
Quick Note:  Called spoke with patient, advised of cxr results / recs as stated by SN. Pt verbalized her understanding and denied any questions. ______

## 2016-05-26 ENCOUNTER — Other Ambulatory Visit: Payer: Self-pay | Admitting: Pulmonary Disease

## 2016-06-29 ENCOUNTER — Other Ambulatory Visit: Payer: Self-pay | Admitting: Pulmonary Disease

## 2016-07-05 ENCOUNTER — Other Ambulatory Visit: Payer: Self-pay | Admitting: Pulmonary Disease

## 2016-09-12 ENCOUNTER — Other Ambulatory Visit: Payer: Self-pay | Admitting: Pulmonary Disease

## 2016-10-03 ENCOUNTER — Other Ambulatory Visit: Payer: Self-pay | Admitting: Pulmonary Disease

## 2016-10-04 ENCOUNTER — Other Ambulatory Visit (INDEPENDENT_AMBULATORY_CARE_PROVIDER_SITE_OTHER): Payer: Medicare HMO

## 2016-10-04 ENCOUNTER — Encounter: Payer: Self-pay | Admitting: Pulmonary Disease

## 2016-10-04 ENCOUNTER — Ambulatory Visit (INDEPENDENT_AMBULATORY_CARE_PROVIDER_SITE_OTHER): Payer: Medicare HMO | Admitting: Pulmonary Disease

## 2016-10-04 VITALS — BP 128/64 | HR 80 | Temp 97.6°F | Resp 18 | Ht 68.0 in | Wt 201.6 lb

## 2016-10-04 DIAGNOSIS — J301 Allergic rhinitis due to pollen: Secondary | ICD-10-CM | POA: Diagnosis not present

## 2016-10-04 DIAGNOSIS — K219 Gastro-esophageal reflux disease without esophagitis: Secondary | ICD-10-CM | POA: Diagnosis not present

## 2016-10-04 DIAGNOSIS — J449 Chronic obstructive pulmonary disease, unspecified: Secondary | ICD-10-CM

## 2016-10-04 DIAGNOSIS — E663 Overweight: Secondary | ICD-10-CM

## 2016-10-04 DIAGNOSIS — F411 Generalized anxiety disorder: Secondary | ICD-10-CM

## 2016-10-04 LAB — CBC WITH DIFFERENTIAL/PLATELET
BASOS ABS: 0 10*3/uL (ref 0.0–0.1)
BASOS PCT: 0.5 % (ref 0.0–3.0)
EOS ABS: 0.3 10*3/uL (ref 0.0–0.7)
Eosinophils Relative: 6.6 % — ABNORMAL HIGH (ref 0.0–5.0)
HCT: 35.2 % — ABNORMAL LOW (ref 36.0–46.0)
Hemoglobin: 11.9 g/dL — ABNORMAL LOW (ref 12.0–15.0)
Lymphocytes Relative: 25 % (ref 12.0–46.0)
Lymphs Abs: 1 10*3/uL (ref 0.7–4.0)
MCHC: 33.7 g/dL (ref 30.0–36.0)
MCV: 85.1 fl (ref 78.0–100.0)
Monocytes Absolute: 0.5 10*3/uL (ref 0.1–1.0)
Monocytes Relative: 13.2 % — ABNORMAL HIGH (ref 3.0–12.0)
NEUTROS ABS: 2.2 10*3/uL (ref 1.4–7.7)
NEUTROS PCT: 54.7 % (ref 43.0–77.0)
PLATELETS: 226 10*3/uL (ref 150.0–400.0)
RBC: 4.13 Mil/uL (ref 3.87–5.11)
RDW: 13.5 % (ref 11.5–15.5)
WBC: 4.1 10*3/uL (ref 4.0–10.5)

## 2016-10-04 MED ORDER — METHYLPREDNISOLONE 4 MG PO TABS: 4.0000 mg | ORAL_TABLET | Freq: Every day | ORAL | 1 refills | Status: DC

## 2016-10-04 MED ORDER — HYDROCODONE-HOMATROPINE 5-1.5 MG/5ML PO SYRP: 5.0000 mL | ORAL_SOLUTION | Freq: Four times a day (QID) | ORAL | 0 refills | Status: DC | PRN

## 2016-10-04 MED ORDER — TRAMADOL HCL 50 MG PO TABS: 50.0000 mg | ORAL_TABLET | Freq: Three times a day (TID) | ORAL | 1 refills | Status: DC | PRN

## 2016-10-04 NOTE — Progress Notes (Signed)
Subjective:    Patient ID: Rachael Peck, female    DOB: 01/15/1946, 70 y.o.   MRN: [id number]  HPI 70 y/o BF here for a follow up visit... she has multiple medical problems including:  AR;  Asthma;  HBP;  Hx atypCP & palpit;  Overweight;  GERD;  Divertics;  Stress incontinence;  Anxiety... She [place] in [place] and she tells me she is followed for general medical purposes in the [facility] & was referred to [place] for cardiac eval- we do not have any of these records- she denies recurrent CP, palpit, etc...  ~  SEE PREV EPIC NOTES FOR OLDER DATA >>    Only labs/ XRays here were in 2010 as she assures me that XRays, EKGs, LABS all done at [facility] (we have requested records)...  CXR 5/15 showed improved RLL & no evid of pneumonia seen, sl peribronch thickening, otherw neg/ NAD...  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   LABS are all done at [facility]  ~  August 21, 2014:  2mo ROV and Rachael Peck has been doing reasonably well- but notes recent resp exac w/ choking spells at night assoc w/ cough, chest tightness, some wheezing, "hard to breathe", etc;  I am very suspicious for LPR, reflux related resp exac, & she notes "choking more";  She has been on diet (weight unchanged), taking Advair250Bid, NEBS w/ Duoneb Bid, Singulair10, and using Hycodan/ Tessalon prn;  She also takes [place] regularly;  For the chronic GERD/ reflux she is on ZANTAC300 Qhs, and Levsin0.125mg  prn...  She [place] her regular [place] f[place]u [place] and specialty f/u at [facility]...  Exam shows clear chest, sl decr BS bilat but no rales, rhonchi, or signs of consolidation... Last CXR was 5/15 showing norm heart size, clear lungs x mild incr markings at bases, NAD, mod thoracic spondylosis...     We reviewed prob list, meds, xrays; note- labs all done by her PrimaryCare in [place]>>  PLAN>>  We discussed need for further GI eval of her LPR,  choking episodes, etc because they are clearly related to her AB exac, coughing, etc;  Rec referral to [facility] (she has seen them in the past) for further eval- ?Ba swallow, EGD, etc;  In the meanwhile continue Rx w/ Advair250, NEBS w/ Duoneb, Singulair, and vigorous antireflux regimen...   ~  Mar 10, 2015:  16mo ROV & f/u Asthma w/ component of fixed obstructive dis> at time of her last OV we referred her to [place] (as above) but she never went;  Presents today c/o 3d hx cough, sm amt clear white sput, no hemoptysis, mild SOB & chest discomfort, denies f/c/s and she is vague/ not sure what brought this on/ etc... We reviewed the following medical problems during today's office visit >>     AR/ Asthma> on Allegra/ Flonase, NEBS w/ Albut/Ipratrop prn, Advair250Bid, Singulair10; ?[place]? Asked to add Mucinex-2Bid + fluids; 5/16 presents w/ ABexac=> Rx w/ Depo80, dosepak, ZPak...     HBP> on Aten100, Amlod10-1/2, HCT12.5 & K10-2Bid; BP= 134/76 & she denies CP, palpit, ch in SOB, edema...    Overweight> "Im watching my diet" and weight = 205# (no real change) & BMI=31; we reviewed diet & exercise strategies...    GI> GERD, Divertics> on Zantac300Qhs, & Levsin prn; denies abd pain, n/v, c/d, blood seen; she is due for a follow up  colonoscopy- encouraged to set this up thru her contacts at [facility]...    GU> Stress Incont> prev on Oxybut but off now; she states voiding is better & leaking less, doesn't want meds...    BREAST CANCER> Dx and treated at [facility] in 2014> Surgery, XRT, Oncology- on Tamoxifen now... NO OLD RECORDS AVAIL IN CareEverywhere?    TIA> on ASA81; followed at [facility], she denies cerebral ischemic symptoms...    Anxiety> on Trazodone50Qhs, Alprazolam0.25mg  prn; she is generally stable... We reviewed prob list, meds, xrays and labs> see below for updates >>   CXR 5/16 showed norm heart size, clear lungs, NAD...   Spirometry 5/16 showed FVC=2.16 (73%), FEV1=1.42 (63%), %1sec=66,  mid-flows are reduced at 30% predicted; c/w mod airflow obstruction and GOLD Stage 2 COPD...     IMP/PLAN>>  Rachael Peck appears to have an AB exac, and an element of fixed obstruction due to her poorly controlled asthma over the yrs; Rec to treat w/ Depo80, Pred dosepak, & ZPak at this time; she will continue to use her NEBULIZER w/ Duoneb Tid, NOIBBC488 Bid, Singulair10, etc...   ~  August 10, 2015:  53mo ROV & add-on appt requested by pt> presents c/o feeling bad over the past month, assoc w/ incr cough & mucus; she called & we phoned in Pred which helped; now c/o an "attack" today w/ incr cough, clear phlegm, no hemoptysis, and incr SOB although she is quite vague & unsure of her symptoms- says she had some tightness but "just a little wheezing"; on exam she is in no distress and chest is clear w/o wheezing/ rales/ rhonchi; her abd however is sl distended w/ +epig tenderness on palpation assoc w/ decr BS; she notes recent diarrhea & taking Activia, notes some left sided abd discomfort but denies reflux, dysphagia, n/v, blood seen, etc...     AR/ Asthma> on Allegra/ Flonase, NEBS w/ Albut/Ipratrop Bid-Tid, Advair250Bid, Singulair10; ?[place]? Prev asked to add Mucinex-2Bid + fluids; presents 10/16 w/ incr SOB but chest is clear & just finished Pred called in for her; we decided to incr to ADVAIR500-Bid + use the NEBS Tid regularly...    HBP> on Aten100, Amlod10-1/2, HCT12.5 & K10-2Bid; BP= 140/72 & she denies CP, palpit, edema...    Overweight> "Im watching my diet" and weight down 6# to 199# & BMI=30; we reviewed diet & exercise strategies...    GI> GERD, Divertics> on Zantac300Qhs, & Levsin prn; she has epig tender, distention, decr BS; she is due for a follow up colonoscopy- we will refer to her GI in [place] [facility]...    GU> Stress Incont> prev on Oxybut but off now; she states voiding is better & leaking less, doesn't want meds...    BREAST CANCER> Dx and treated at [facility] in 2014> Surgery,  XRT, Oncology- on Tamoxifen now... NO OLD RECORDS AVAIL IN CareEverywhere?    TIA> on ASA81; followed at [facility], she denies cerebral ischemic symptoms...    Anxiety> on Trazodone50Qhs, Alprazolam0.25mg  prn; she is quite stressed & asked to take the alpraz0.25 tid to help...  We reviewed prob list, meds, xrays and labs> see below for updates >>     PLAN>>  We reviewed her prev CXR & spirometry- several interval calls w/ Pred called in for her; currently c/o "attack" today but exam is clear w/o tightness or wheezing; we decided to incr the ADVAIR to the 500 inhaler- still one inhalation Bid & she is encouraged to take the Alpraz0.25 tid to help  w/ her nerves;  She alos has on-going GI issues- c/o diarrhea but exam is distended, sl tender, decr BS- encouraged to follow antireflux regimen, add PROTONIX40 Qam, take the Zantac Qhs, add Align to the Activia, and f/u w/ her GI=DrConway in [place]...  ~  March 29, 2016:  7-95mo ROV & Rachael Peck returns for a pulmonary follow up visit>  She tells me that she has recently been house-bound due to the springtime/ pollen/ trees and notes coughing and "resp attacks";  She has been taking Advair500Bid, Singulair10, NEBS w/ Albut & Ipratropium Bid, VentolinHFA (using it 3-4 times daily), plus her Allegra180 & Flonase=> and she notes very joyously that "all this has brought me back to life" but she is requesting a regular cough medication (we discussed trial TRAMADOL 50mg  Tid prn);  She has not required any ER visits/ hospitalizations/ nor has she called here or [facility] for additional meds/ steroids/ etc... 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     12/30/15> she was seen in the [facility] clinic by DrAvery & notes to be doing well on her Tamoxifen, few hot flashes, note reviewed- exam was OK, Labs OK, Mammogram OK, & they plan ROV 20mo...    11/26/15> she was seen in the GI clinic by [facility], GERD, LPR; on Protonix40, Zantac300, +lifestyle modifications and she was much improved; they plan recheck in 83mo & try to get her to reduce the PPI dose... EXAM shows Afeb, VSS, O2sat=100% on RA;  HEENT- neg, mallampati2;  Chest- clerar w/o w/r/r;  Heart- RR w/o m/r/g;  Abd- soft, nontender, neg;  Ext- neg w/o c/c/e;  Neuro- intact...   LABS 12/30/15 at [facility]>  Chems- wnl;  CBC- wnl w/ Hg=12.4, MCV=86, WBC=3.9  Mammogram 02/04/16 at [facility]>  Benign, scat fibroglandular densities, no suspicious mass or calcif seen, s/p lumpectomy on left  CXR 03/29/16>  Norm heart size, clear lungs- NAD, post op changes in left breast area... IMP/PLAN>>  Despite her intermittent symptoms- Rachael Peck appears stable from the pulmonary standpoint & hasn't required ER visits, hospitalizations, office phone calls for additional meds, etc; she is pleased w/ her current med regimen & just wants to continue this the same=> meds refilled today & new Rx for TRAMADOL50mg Tid to try for her cough... We plan ROV in 41mo, sooner if needed for acute problems...   ~  October 04, 2016:  33mo ROV and pulmonary follow up visit>  Rachael Peck continues her medical follow up in [place] but states she has several issues se wishes to discuss w/ me- she is still on Aten100 but is being switched to Metoprolol when out;  She has noted an incr cough for the last 61mo, sore in her sides from coughing, chr stable SOB/DOE "not bad" she says, no f/c/s- she  wants a cough syrup...    Interval records from [place] reviewed in epic & summarized  here=> she has a long list of providers!    Medical f/u by PA-Cooper in [facility] 08/31/16> COPD, HBP, DM, OA- doing well, active, no issues;  FLP 11/17 showed TChol 143, TG 366, HDL 48, LDL 43 on diet alone...     She had f/u GI- [facility] 07/21/16> GERD- on Protonix40 & Zantac150Bid, pt was reluctant to decr meds & they decided to continue same...    She was seen by [facility] NP-[facility] 06/29/16> left breast cancer dx 12/2012 (DCIS- ER/PR pos)& s/p left lumpectomy3/3/14, re-excision of pos margins 01/14/13, aqdjuvant XRT completed 03/25/13, Tamoxifen started 03/2013- c/o hot flashes... EXAM shows Afeb, VSS, O2sat=97% on RA;  HEENT- neg, mallampati2;  Chest- clear w/o w/r/r;  Heart- RR w/o m/r/g;  Abd- soft, nontender, neg;  Ext- neg w/o c/c/e;  Neuro- intact...   CXR 10/04/16>  SHE DID NOT GO TO XRAY for this film; Last CXR 03/29/16 reviewed by me in PACS- norm heart size, clear lungs, post op changes in left breast area, degen changes in Tspine...  Spirometry 10/04/16>  FVC=2.17 (75%), FEV1=1.44 (64%), %1sec=66, mid-flows reduced at 37% predicted   This is c/w a mod obstructive ventilatory defect...  FeNO 10/04/16>  FeNO = 125 => we decided to continue Advair500 & add Medrol4mg /d  LABS 10/04/16>  CBC- Hg=11.9, wbc=4.1, eos=6.6% (therefore eos~300);  IgE level=16;  RAST tests= all NEG IMP/PLAN>>  Rachael Peck has mild to mod COPD based on her long hx asthma w/ fixed airway obstruction; I am surprised that her FeNO is that high=> we are evaluating further w/ CBC/ diff to check Eos & and IgE/ RAST to see if a biological would be appropriate for her; in the meanwhile we will add MEDROL 4mg  Qam (low dose due to her IFG/DM diagnosis); we plan ROV recheck in 4wks...  ADDENDUM>> with her norm IgE/RAST, and Eos~300 she would be a candidate for [facility] Rx & we will consider this in follow up...          Problem List:    ALLERGIC  RHINITIS (ICD-477.9) - on ALLEGRA 180mg /d, & FLONASE Qhs...   CHRONIC OBSTRUCTIVE ASTHMA UNSPECIFIED (ICD-493.20) - on ADVAIR 250Bid (but not compliant),  PROAIR HFA Prn (using 2-3 times daily), ATROVENT 2spQid (started by the [facility] in [place]), & MUCINEX 1-2Bid... ~  baseline CXR 11/08 showed sl hyperaeration, clear, NAD.Marland Kitchen. spondylitic changes in TSpine noted. ~  PFT 3/09 showed FVC= 3.36 (92%), FEV1= 2.13 (78%), %1sec= 63, mid-flows= 26% pred;  TLC= 6.61 (112%), RV= 3.25 (145%), RV/TLC= 49%;  DLCO= norm... ~  f/u CXR 8/10 showed min bibasilar atx, NAD... ~  PFT 2/11 showed FVC= 2.22 (73%), FEV1= 1.68 (70%), %1sec=76, mid-flows= 57%pred. ~  4/12:  64mo ROV here & she tells me she is getting her care thru the [facility] in [place]; just wants refills today- declines CXR, labs, etc; she hasn't been taking the Advair due to cost & we discussed regimen w/ ALBUTEROL 2spQid, ATROVENT 2spQid, FLOVENT 220- 2spBid, Singulair 10mg /d, Mucinex 2Bid, fluids, etc... ~  4/13:  Yearly ROV & she remains stable, same meds & followed in [place] as noted... ~  10/13:  Mild exac treated w/ her NEBS Qid, Advair250, Mucinex2Bid, Hycodan cough syrup... ~  7/14: on Allegra/ Flonase, NEBS w/ Albut/Ipratrop Qid, Advair250 (only uses samples due to $$) vs Flovent220-2slBid, Singulair10; ?gets meds in [place]? Asked to add Mucinex-2Bid + fluids. ~  4/15: presented w/ cough, yellow sput, incr SOB; CXR w/ RLL pneum; treated w/ Carole Civil,  Pred, Mucinex, etc & resolved... ~  5/15: follow up visit w/ resolution of pneumonia; encouraged to use meds regularly: on Allegra/ Flonase, NEBS w/ Albut/Ipratrop prn, Advair250Bid, Singulair10... ~  11/15: f/u visit w/ prominent LPR symptoms, choking, etc; we reviewed antireflux regimen, continue Advair250, NEBS, Singulair10, etc and refer to [facility] for further eval of her LPR/ choking episodes... ~  5/16: presents w/ AB exac and treated w/ Depo80, Pred dosepak, ZPak...  ~  CXR 5/16  showed norm heart size, clear lungs, NAD...  ~  Spirometry 5/16 showed FVC=2.16 (73%), FEV1=1.42 (63%), %1sec=66, mid-flows are reduced at 30% predicted; c/w mod airflow obstruction and GOLD Stage 2 COPD   FOLLOWED FOR PRIMARY CARE by [place] [facility] >>   HYPERTENSION (ICD-401.9) - controlled on ATENOLOL100, AMLODIPINE10-1/2, HCTZ12.5, KCl 10-2Bid;  BP= 134/76, we do not have notes from her Primary...  CHEST PAIN, ATYPICAL (ICD-786.59) - on ASA 81mg /d... she denies recurrent CP, exertional CP, etc...  PALPITATIONS (ICD-785.1) - hx palpitations in the past... no recent symptoms on meds above & she knows to avoid caffeine, etc...  OVERWEIGHT (ICD-278.02) - we reviewed diet + exercise; weight = 205# w/ BMI= 31-2...  GERD (ICD-530.81) - on ZANTAC 300mg Qhs... she has a hx of GERD and LPR prev controlled on these meds but now symptoms have returned w/ prominent choking episodes & resp exac...   DIVERTICULOSIS OF COLON (ICD-562.10) - on Levsin prn; last colonoscopy 7/04 by DrJEdwards- showed divertics only; she now gets her GI follow up [place]...  BREAST CANCER >> she noted blood in her bra from nipple disch, initial mammogram in [place] was neg but check up from her primary doctor ([facility] in [place]) lead to a bx from surg at [facility] w/ Dx of Breast Cancer> Surg- DrMcNutt (initial excision followed by 2nd surg for pos margins), XRT- DrBrown, Oncology- DrSwift now on Tamoxifen & doing very well by her report...  ~  See notes in Care Everywhere=> reviewed...  STRESS INCONTINENCE (ICD-788.39) - she claims leakage of urine and stool- she has been recommended to f/u w/ GYN for this eval....  TIA >> on ASA 81mg /d & prev on Plavix as well...  ANXIETY (ICD-300.00) - states that she is under stress due to her condition and having to care for her mother (on hospice w/ renal failure)... Rx for ALPRAZOLAM 0.25mg  Tid Prn written...   Past Surgical History:  Procedure Laterality Date  . ABDOMINAL  HYSTERECTOMY    . BREAST LUMPECTOMY  12/17/2012 & 01/14/2013    Outpatient Encounter Prescriptions as of 10/04/2016  Medication Sig  . albuterol (PROVENTIL) (2.5 MG/3ML) 0.083% nebulizer solution Take 3 mLs (2.5 mg total) by nebulization 2 (two) times daily.  Marland Kitchen albuterol (VENTOLIN HFA) 108 (90 Base) MCG/ACT inhaler INHALE ONE TO TWO PUFFS INTO LUNGS EVERY 4 TO 6 HOURS AS NEEDED  . ALPRAZolam (XANAX) 0.25 MG tablet Take 1 tablet (0.25 mg total) by mouth 3 (three) times daily as needed.  Marland Kitchen amLODipine (NORVASC) 10 MG tablet Take 5 mg by mouth daily.   . ARTIFICIAL TEARS ophthalmic solution As needed for dry eyes   . aspirin (BAYER LOW STRENGTH) 81 MG EC tablet Take 81 mg by mouth daily.    Marland Kitchen atenolol (TENORMIN) 100 MG tablet Take 100 mg by mouth daily.  . cholecalciferol (VITAMIN D) 1000 units tablet Take 1,000 Units by mouth daily.  . fexofenadine (ALLEGRA) 180 MG tablet Take 1 tablet (180 mg total) by mouth daily. Once a day  as needed for allergies  . fluticasone (FLONASE) 50 MCG/ACT nasal spray USE TWO SPRAYS IN EACH NOSTRIL ONE TIME DAILY  . Fluticasone-Salmeterol (ADVAIR DISKUS) 500-50 MCG/DOSE AEPB Inhale 1 puff into the lungs 2 (two) times daily.  . Fluticasone-Salmeterol (ADVAIR DISKUS) 500-50 MCG/DOSE AEPB Inhale 1 puff into the lungs 2 (two) times daily.  . hydrochlorothiazide (HYDRODIURIL) 12.5 MG tablet Take 12.5 mg by mouth daily.  Marland Kitchen HYDROcodone-homatropine (HYDROMET) 5-1.5 MG/5ML syrup Take 5 mLs by mouth every 6 (six) hours as needed for cough.  . hyoscyamine (LEVSIN) 0.125 MG tablet Take 1 tablet (0.125 mg total) by mouth 3 (three) times daily as needed for cramping.  Marland Kitchen ipratropium (ATROVENT) 0.02 % nebulizer solution INHALE THE CONTENTS OF 1 VIAL VIA NEBULIZER THREE TIMES DAILY  . loratadine (CLARITIN) 10 MG tablet Take 10 mg by mouth daily as needed.  . meclizine (ANTIVERT) 25 MG tablet TAKE ONE TABLET BY MOUTH THREE TIMES DAILY AS NEEDED FOR DIZZINESS OR NAUSEA.  . montelukast  (SINGULAIR) 10 MG tablet Take 1 tablet (10 mg total) by mouth at bedtime.  . pantoprazole (PROTONIX) 40 MG tablet TAKE 1 TABLET DAILY  30 MINUTES PRIOR TO BREAKFAST  . potassium chloride (MICRO-K) 10 MEQ CR capsule TAKE 2 CAPSULES (20 MEQ TOTAL) BY MOUTH 2 (TWO) TIMES DAILY.  . ranitidine (ZANTAC) 300 MG tablet TAKE 1 TABLET AT BEDTIME  . tamoxifen (NOLVADEX) 20 MG tablet Take 20 mg by mouth daily.  . traMADol (ULTRAM) 50 MG tablet Take 1 tablet (50 mg total) by mouth 3 (three) times daily as needed (cough).  . traZODone (DESYREL) 50 MG tablet Take 50 mg by mouth at bedtime. For sleep   . vitamin B-12 (CYANOCOBALAMIN) 1000 MCG tablet Take 1,000 mcg by mouth daily.    No Known Allergies   Review of Systems         See HPI - all other systems neg except as noted... The patient complains of dyspnea on exertion, incontinence, and difficulty walking.  The patient denies anorexia, fever, weight loss, weight gain, vision loss, decreased hearing, hoarseness, chest pain, syncope, peripheral edema, prolonged cough, headaches, hemoptysis, abdominal pain, melena, hematochezia, severe indigestion/heartburn, hematuria, muscle weakness, suspicious skin lesions, transient blindness, depression, unusual weight change, abnormal bleeding, enlarged lymph nodes, and angioedema.     Objective:   Physical Exam     WD, Overweight, 70 y/o BF in NAD... GENERAL:  Alert & oriented; pleasant & cooperative...  HEENT:  [place]/AT, EOM-wnl, PERRLA, EACs-clear, TMs-wnl, NOSE-clear, THROAT-clear & wnl. NECK:  Supple w/ fairROM; no JVD; normal carotid impulses w/o bruits; no thyromegaly or nodules palpated; no lymphadenopathy. CHEST:  decr BS bilat but clear- no wheezing/ rales/ rhonchi/ or signs of consolidation... HEART:  Regular Rhythm; without murmurs/ rubs/ or gallops detected... ABDOMEN:  Obese, sl distended, decr BS, & tender epig; no organomegaly or masses palpated... EXT: without deformities, mild arthritic changes;  no varicose veins/ +venous insuffic/ no edema. NEURO:  CN's intact;  no focal neuro deficits... DERM:  No lesions noted; no rash etc...  RADIOLOGY DATA:  Reviewed in the EPIC EMR & discussed w/ the patient...  LABORATORY DATA:  Reviewed in the EPIC EMR & discussed w/ the patient...   ASSESSMENT & PLAN:    AR & ASTHMA>  Her chronic obstructive asthma has been better regulated w/ meds from [facility] [place] [facility] w/ their samples etc; compliance has been an issue in the past; now supposed to be on Advair500Bid, NEBS w/ Albut & Ipratrop  Tid, Singulair10, Hycodan prn; she has required occas rounds of antibiotics and Pred in the past so we will remind her to use the NEBULIZER w/ both meds Tid regularly + the Advair500Bid...  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  ADDENDUM>> with her norm IgE/RAST, and Eos~300 she would be a candidate for [facility] Rx & we will consider this in follow up...   GI> Hx GERD, Divertics> more abd findings than chest findings today- hx diarrhea, distended, decr BS, epig tender; we will add PPI- Protonix40, continue Zantac300, add Align to her Activia 7 plan f/u appt w/ GI- [facility]Conway [place]...  << Medical problems followed by her primary care team & specialists at [facility] >>  HBP>   AtypCP & Palpit>  Breast Cancer>   Stress Incont>  TIA>   Anxiety>  She needs to take the Alpraz0.25 tid as discussed...   Patient's Medications  New Prescriptions   HYDROCODONE-HOMATROPINE (HYCODAN) 5-1.5 MG/5ML SYRUP    Take 5 mLs by mouth every 6 (six) hours as needed for cough.   METHYLPREDNISOLONE (MEDROL) 4 MG TABLET    Take 1 tablet (4 mg total) by mouth  daily.   TRAMADOL (ULTRAM) 50 MG TABLET    Take 1 tablet (50 mg total) by mouth 3 (three) times daily as needed.  Previous Medications   ALBUTEROL (PROVENTIL) (2.5 MG/3ML) 0.083% NEBULIZER SOLUTION    Take 3 mLs (2.5 mg total) by nebulization 2 (two) times daily.   ALBUTEROL (VENTOLIN HFA) 108 (90 BASE) MCG/ACT INHALER    INHALE ONE TO TWO PUFFS INTO LUNGS EVERY 4 TO 6 HOURS AS NEEDED   ALPRAZOLAM (XANAX) 0.25 MG TABLET    Take 1 tablet (0.25 mg total) by mouth 3 (three) times daily as needed.   AMLODIPINE (NORVASC) 10 MG TABLET    Take 5 mg by mouth daily.    ARTIFICIAL TEARS OPHTHALMIC SOLUTION    As needed for dry eyes    ASPIRIN (BAYER LOW STRENGTH) 81 MG EC TABLET    Take 81 mg by mouth daily.     ATENOLOL (TENORMIN) 100 MG TABLET    Take 100 mg by mouth daily.   CHOLECALCIFEROL (VITAMIN D) 1000 UNITS TABLET    Take 1,000 Units by mouth daily.   FEXOFENADINE (ALLEGRA) 180 MG TABLET    Take 1 tablet (180 mg total) by mouth daily. Once a day as needed for allergies   FLUTICASONE (FLONASE) 50 MCG/ACT NASAL SPRAY    USE TWO SPRAYS IN EACH NOSTRIL ONE TIME DAILY   FLUTICASONE-SALMETEROL (ADVAIR DISKUS) 500-50 MCG/DOSE AEPB    Inhale 1 puff into the lungs 2 (two) times daily.   FLUTICASONE-SALMETEROL (ADVAIR DISKUS) 500-50 MCG/DOSE AEPB    Inhale 1 puff into the lungs 2 (two) times daily.   HYDROCHLOROTHIAZIDE (HYDRODIURIL) 12.5 MG TABLET    Take 12.5 mg by mouth daily.   HYDROCODONE-HOMATROPINE (HYDROMET) 5-1.5 MG/5ML SYRUP    Take 5 mLs by mouth every 6 (six) hours as needed for cough.   HYOSCYAMINE (LEVSIN) 0.125 MG TABLET    Take 1 tablet (0.125 mg total) by mouth 3 (three)  times daily as needed for cramping.   IPRATROPIUM (ATROVENT) 0.02 % NEBULIZER SOLUTION    INHALE THE CONTENTS OF 1 VIAL VIA NEBULIZER THREE TIMES DAILY   LORATADINE (CLARITIN) 10 MG TABLET    Take 10 mg by mouth daily as needed.   MECLIZINE (ANTIVERT) 25 MG TABLET    TAKE ONE TABLET BY MOUTH THREE TIMES DAILY AS NEEDED FOR  DIZZINESS OR NAUSEA.   MONTELUKAST (SINGULAIR) 10 MG TABLET    Take 1 tablet (10 mg total) by mouth at bedtime.   PANTOPRAZOLE (PROTONIX) 40 MG TABLET    TAKE 1 TABLET DAILY  30 MINUTES PRIOR TO BREAKFAST   POTASSIUM CHLORIDE (MICRO-K) 10 MEQ CR CAPSULE    TAKE 2 CAPSULES (20 MEQ TOTAL) BY MOUTH 2 (TWO) TIMES DAILY.   RANITIDINE (ZANTAC) 300 MG TABLET    TAKE 1 TABLET AT BEDTIME   TAMOXIFEN (NOLVADEX) 20 MG TABLET    Take 20 mg by mouth daily.   TRAMADOL (ULTRAM) 50 MG TABLET    Take 1 tablet (50 mg total) by mouth 3 (three) times daily as needed (cough).   TRAZODONE (DESYREL) 50 MG TABLET    Take 50 mg by mouth at bedtime. For sleep    VITAMIN B-12 (CYANOCOBALAMIN) 1000 MCG TABLET    Take 1,000 mcg by mouth daily.  Modified Medications   No medications on file  Discontinued Medications   No medications on file

## 2016-10-04 NOTE — Patient Instructions (Addendum)
Today we updated your med list in our EPIC system...    Continue your current medications the same...  Today we checked a Spirometry breathing test and an FeNO (asthma inflammation test)...    We are also checking some specialty blood work IgE, RAST, eos- to see if you are a candidate for any additional Rx.  We recommend that you>>    Continue the NEBULIZER w/ Albut & Ipratrop 3 times daily    Continue the ADVAIR500- one inhalation twice daily    Add the new MEDROL 4mg  tabs- take one tab each AM til return...     We wrote new prescriptions for>>    TRAMADOL 50mg - take one tab up to 3 times daily as needed for pain & cough...    HYCODAN coug syrup- take one tsp up to 3 times daily for cough...  Continue your other medications under the direction of your Primary Care Team at the [facility] [place], [place] ([facility]), & [facility] ([facility]).  Call for any questions...  Let's plan a follow up visit in one month to reassess.Marland KitchenMarland Kitchen

## 2016-10-05 LAB — RESPIRATORY ALLERGY PROFILE REGION II ~~LOC~~
Allergen, C. Herbarum, M2: <0.1 kU/L
Allergen, Cottonwood, t14: <0.1 kU/L
Allergen, Mouse Urine Protein, e78: <0.1 kU/L
Allergen, Mulberry, t76: <0.1 kU/L
Allergen, P. notatum, m1: <0.1 kU/L
Aspergillus fumigatus, m3: <0.1 kU/L
Bermuda Grass: <0.1 kU/L
Common Ragweed: <0.1 kU/L
Dog Dander: <0.1 kU/L
Elm IgE: <0.1 kU/L
IGE (IMMUNOGLOBULIN E), SERUM: 16 kU/L (ref <115)
Pecan/Hickory Tree IgE: <0.1 kU/L
Timothy Grass: <0.1 kU/L

## 2016-10-11 ENCOUNTER — Telehealth: Payer: Self-pay

## 2016-10-11 NOTE — Telephone Encounter (Signed)
Spoke with patient regarding her lab results and she verbalized understanding. She stated that she still had the cough but that was better. She said that she is taking all of her medications and is hoping that she will be feeling better much in time for her follow up appt.

## 2016-11-08 ENCOUNTER — Ambulatory Visit: Payer: Medicare HMO | Admitting: Pulmonary Disease

## 2016-11-17 ENCOUNTER — Ambulatory Visit: Payer: Medicare HMO | Admitting: Pulmonary Disease

## 2016-11-29 ENCOUNTER — Encounter: Payer: Self-pay | Admitting: Pulmonary Disease

## 2016-11-29 ENCOUNTER — Ambulatory Visit (INDEPENDENT_AMBULATORY_CARE_PROVIDER_SITE_OTHER): Payer: Medicare HMO | Admitting: Pulmonary Disease

## 2016-11-29 VITALS — BP 136/74 | HR 96 | Temp 97.5°F | Ht 68.0 in | Wt 202.0 lb

## 2016-11-29 DIAGNOSIS — E663 Overweight: Secondary | ICD-10-CM

## 2016-11-29 DIAGNOSIS — F411 Generalized anxiety disorder: Secondary | ICD-10-CM

## 2016-11-29 DIAGNOSIS — K219 Gastro-esophageal reflux disease without esophagitis: Secondary | ICD-10-CM

## 2016-11-29 DIAGNOSIS — J449 Chronic obstructive pulmonary disease, unspecified: Secondary | ICD-10-CM

## 2016-11-29 LAB — POCT EXHALED NITRIC OXIDE: FENO LEVEL (PPB): 151

## 2016-11-29 MED ORDER — HYOSCYAMINE SULFATE 0.125 MG PO TABS: 0.1250 mg | ORAL_TABLET | Freq: Three times a day (TID) | ORAL | 3 refills | Status: DC | PRN

## 2016-11-29 MED ORDER — MECLIZINE HCL 25 MG PO TABS: ORAL_TABLET | ORAL | 3 refills | Status: DC

## 2016-11-29 MED ORDER — FLUTICASONE-SALMETEROL 500-50 MCG/DOSE IN AEPB: 1.0000 | INHALATION_SPRAY | Freq: Two times a day (BID) | RESPIRATORY_TRACT | 0 refills | Status: DC

## 2016-11-29 MED ORDER — IPRATROPIUM BROMIDE 0.02 % IN SOLN: RESPIRATORY_TRACT | 6 refills | Status: DC

## 2016-11-29 NOTE — Patient Instructions (Signed)
Today we updated your med list in our EPIC system...    Continue your current medications the same...  We are pleased you have responded so well to the Medrol anti-inflammatory Rx...  Now we want to slowly wean this medication down: Starting now & continuing through the month of February>    Decrease the MEDROL 4mg  tab to 1 tab one day, alternating w/ 1/2 tab the next day (1, 1/2, 1, 1/2, etc)  Then starting in March & continuing until follow up office visit here in April>    Decrease the Medrol to 1 tab every other day (1, 0, 1, 0, etc)  Call for any qestions or problems...  Let's plan a follow up visit in 44months.Marland KitchenMarland Kitchen

## 2016-11-29 NOTE — Progress Notes (Signed)
Subjective:    Patient ID: Rachael Peck, female    DOB: 01/15/1946, 71 y.o.   MRN: [id number]  HPI 71 y/o BF here for a follow up visit... she has multiple medical problems including:  AR;  Asthma;  HBP;  Hx atypCP & palpit;  Overweight;  GERD;  Divertics;  Stress incontinence;  Anxiety... She lives in [place] and she tells me she is followed for general medical purposes in the [facility] & was referred to [place] for cardiac eval- we do not have any of these records- she denies recurrent CP, palpit, etc...  ~  SEE PREV EPIC NOTES FOR OLDER DATA >>    Only labs/ XRays here were in 2010 as she assures me that XRays, EKGs, LABS all done at [facility] (we have requested records)...  CXR 5/15 showed improved RLL & no evid of pneumonia seen, sl peribronch thickening, otherw neg/ NAD...  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   LABS are all done at [facility]  ~  August 21, 2014:  2mo ROV and Rachael Peck has been doing reasonably well- but notes recent resp exac w/ choking spells at night assoc w/ cough, chest tightness, some wheezing, "hard to breathe", etc;  I am very suspicious for LPR, reflux related resp exac, & she notes "choking more";  She has been on diet (weight unchanged), taking Advair250Bid, NEBS w/ Duoneb Bid, Singulair10, and using Hycodan/ Tessalon prn;  She also takes [place] regularly;  For the chronic GERD/ reflux she is on ZANTAC300 Qhs, and Levsin0.125mg  prn...  She continues her regular medical f[place]u [place] and specialty f/u at [facility]...  Exam shows clear chest, sl decr BS bilat but no rales, rhonchi, or signs of consolidation... Last CXR was 5/15 showing norm heart size, clear lungs x mild incr markings at bases, NAD, mod thoracic spondylosis...     We reviewed prob list, meds, xrays; note- labs all done by her PrimaryCare in [place]>>  PLAN>>  We discussed need for further GI eval of her LPR,  choking episodes, etc because they are clearly related to her AB exac, coughing, etc;  Rec referral to [facility] (she has seen them in the past) for further eval- ?Ba swallow, EGD, etc;  In the meanwhile continue Rx w/ Advair250, NEBS w/ Duoneb, Singulair, and vigorous antireflux regimen...   ~  Mar 10, 2015:  16mo ROV & f/u Asthma w/ component of fixed obstructive dis> at time of her last OV we referred her to [facility] (as above) but she never went;  Presents today c/o 3d hx cough, sm amt clear white sput, no hemoptysis, mild SOB & chest discomfort, denies f/c/s and she is vague/ not sure what brought this on/ etc... We reviewed the following medical problems during today's office visit >>     AR/ Asthma> on Allegra/ Flonase, NEBS w/ Albut/Ipratrop prn, Advair250Bid, Singulair10; ?[place]? Asked to add Mucinex-2Bid + fluids; 5/16 presents w/ ABexac=> Rx w/ Depo80, dosepak, ZPak...     HBP> on Aten100, Amlod10-1/2, HCT12.5 & K10-2Bid; BP= 134/76 & she denies CP, palpit, ch in SOB, edema...    Overweight> "Im watching my diet" and weight = 205# (no real change) & BMI=31; we reviewed diet & exercise strategies...    GI> GERD, Divertics> on Zantac300Qhs, & Levsin prn; denies abd pain, n/v, c/d, blood seen; she is due for a follow up  colonoscopy- encouraged to set this up thru her contacts at [facility]...    GU> Stress Incont> prev on Oxybut but off now; she states voiding is better & leaking less, doesn't want meds...    BREAST CANCER> Dx and treated at [facility] in 2014> Surgery, XRT, Oncology- on Tamoxifen now... NO OLD RECORDS AVAIL IN CareEverywhere?    TIA> on ASA81; followed at [facility], she denies cerebral ischemic symptoms...    Anxiety> on Trazodone50Qhs, Alprazolam0.25mg  prn; she is generally stable... We reviewed prob list, meds, xrays and labs> see below for updates >>   CXR 5/16 showed norm heart size, clear lungs, NAD...   Spirometry 5/16 showed FVC=2.16 (73%), FEV1=1.42 (63%), %1sec=66,  mid-flows are reduced at 30% predicted; c/w mod airflow obstruction and GOLD Stage 2 COPD...     IMP/PLAN>>  Rachael Peck appears to have an AB exac, and an element of fixed obstruction due to her poorly controlled asthma over the yrs; Rec to treat w/ Depo80, Pred dosepak, & ZPak at this time; she will continue to use her NEBULIZER w/ Duoneb Tid, NOIBBC488 Bid, Singulair10, etc...   ~  August 10, 2015:  53mo ROV & add-on appt requested by pt> presents c/o feeling bad over the past month, assoc w/ incr cough & mucus; she called & we phoned in Pred which helped; now c/o an "attack" today w/ incr cough, clear phlegm, no hemoptysis, and incr SOB although she is quite vague & unsure of her symptoms- says she had some tightness but "just a little wheezing"; on exam she is in no distress and chest is clear w/o wheezing/ rales/ rhonchi; her abd however is sl distended w/ +epig tenderness on palpation assoc w/ decr BS; she notes recent diarrhea & taking Activia, notes some left sided abd discomfort but denies reflux, dysphagia, n/v, blood seen, etc...     AR/ Asthma> on Allegra/ Flonase, NEBS w/ Albut/Ipratrop Bid-Tid, Advair250Bid, Singulair10; ?[place]? Prev asked to add Mucinex-2Bid + fluids; presents 10/16 w/ incr SOB but chest is clear & just finished Pred called in for her; we decided to incr to ADVAIR500-Bid + use the NEBS Tid regularly...    HBP> on Aten100, Amlod10-1/2, HCT12.5 & K10-2Bid; BP= 140/72 & she denies CP, palpit, edema...    Overweight> "Im watching my diet" and weight down 6# to 199# & BMI=30; we reviewed diet & exercise strategies...    GI> GERD, Divertics> on Zantac300Qhs, & Levsin prn; she has epig tender, distention, decr BS; she is due for a follow up colonoscopy- we will refer to her GI in [place] [facility]...    GU> Stress Incont> prev on Oxybut but off now; she states voiding is better & leaking less, doesn't want meds...    BREAST CANCER> Dx and treated at [facility] in 2014> Surgery,  XRT, Oncology- on Tamoxifen now... NO OLD RECORDS AVAIL IN CareEverywhere?    TIA> on ASA81; followed at [facility], she denies cerebral ischemic symptoms...    Anxiety> on Trazodone50Qhs, Alprazolam0.25mg  prn; she is quite stressed & asked to take the alpraz0.25 tid to help...  We reviewed prob list, meds, xrays and labs> see below for updates >>     PLAN>>  We reviewed her prev CXR & spirometry- several interval calls w/ Pred called in for her; currently c/o "attack" today but exam is clear w/o tightness or wheezing; we decided to incr the ADVAIR to the 500 inhaler- still one inhalation Bid & she is encouraged to take the Alpraz0.25 tid to help  w/ her nerves;  She alos has on-going GI issues- c/o diarrhea but exam is distended, sl tender, decr BS- encouraged to follow antireflux regimen, add PROTONIX40 Qam, take the Zantac Qhs, add Align to the Activia, and f/u w/ her GI=DrConway in [place]...  ~  March 29, 2016:  7-95mo ROV & Rachael Peck returns for a pulmonary follow up visit>  She tells me that she has recently been house-bound due to the springtime/ pollen/ trees and notes coughing and "resp attacks";  She has been taking Advair500Bid, Singulair10, NEBS w/ Albut & Ipratropium Bid, VentolinHFA (using it 3-4 times daily), plus her Allegra180 & Flonase=> and she notes very joyously that "all this has brought me back to life" but she is requesting a regular cough medication (we discussed trial TRAMADOL 50mg  Tid prn);  She has not required any ER visits/ hospitalizations/ nor has she called here or [facility] for additional meds/ steroids/ etc... 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     12/30/15> she was seen in the [facility] clinic by DrAvery & notes to be doing well on her Tamoxifen, few hot flashes, note reviewed- exam was OK, Labs OK, Mammogram OK, & they plan ROV 20mo...    11/26/15> she was seen in the GI clinic by [facility], GERD, LPR; on Protonix40, Zantac300, +lifestyle modifications and she was much improved; they plan recheck in 83mo & try to get her to reduce the PPI dose... EXAM shows Afeb, VSS, O2sat=100% on RA;  HEENT- neg, mallampati2;  Chest- clerar w/o w/r/r;  Heart- RR w/o m/r/g;  Abd- soft, nontender, neg;  Ext- neg w/o c/c/e;  Neuro- intact...   LABS 12/30/15 at [facility]>  Chems- wnl;  CBC- wnl w/ Hg=12.4, MCV=86, WBC=3.9  Mammogram 02/04/16 at [facility]>  Benign, scat fibroglandular densities, no suspicious mass or calcif seen, s/p lumpectomy on left  CXR 03/29/16>  Norm heart size, clear lungs- NAD, post op changes in left breast area... IMP/PLAN>>  Despite her intermittent symptoms- Srinidhi appears stable from the pulmonary standpoint & hasn't required ER visits, hospitalizations, office phone calls for additional meds, etc; she is pleased w/ her current med regimen & just wants to continue this the same=> meds refilled today & new Rx for TRAMADOL50mg Tid to try for her cough... We plan ROV in 41mo, sooner if needed for acute problems...   ~  October 04, 2016:  33mo ROV and pulmonary follow up visit>  Rachael Peck continues her medical follow up in [place] but states she has several issues se wishes to discuss w/ me- she is still on Aten100 but is being switched to Metoprolol when out;  She has noted an incr cough for the last 61mo, sore in her sides from coughing, chr stable SOB/DOE "not bad" she says, no f/c/s- she  wants a cough syrup...    Interval records from [place] reviewed in epic & summarized  here=> she has a long list of providers!    Medical f/u by PA-Cooper in [facility] 08/31/16> COPD, HBP, DM, OA- doing well, active, no issues;  FLP 11/17 showed TChol 143, TG 366, HDL 48, LDL 43 on diet alone...     She had f/u GI- [facility]Conway 07/21/16> GERD- on Protonix40 & Zantac150Bid, pt was reluctant to decr meds & they decided to continue same...    She was seen by [facility] NP-[facility] 06/29/16> left breast cancer dx 12/2012 (DCIS- ER/PR pos)& s/p left lumpectomy3/3/14, re-excision of pos margins 01/14/13, aqdjuvant XRT completed 03/25/13, Tamoxifen started 03/2013- c/o hot flashes... EXAM shows Afeb, VSS, O2sat=97% on RA;  HEENT- neg, mallampati2;  Chest- clear w/o w/r/r;  Heart- RR w/o m/r/g;  Abd- soft, nontender, neg;  Ext- neg w/o c/c/e;  Neuro- intact...   CXR 10/04/16>  SHE DID NOT GO TO XRAY for this film; Last CXR 03/29/16 reviewed by me in PACS- norm heart size, clear lungs, post op changes in left breast area, degen changes in Tspine...  Spirometry 10/04/16>  FVC=2.17 (75%), FEV1=1.44 (64%), %1sec=66, mid-flows reduced at 37% predicted   This is c/w a mod obstructive ventilatory defect...  FeNO 10/04/16>  FeNO = 125 => we decided to continue Advair500 & add Medrol4mg /d  LABS 10/04/16>  CBC- Hg=11.9, wbc=4.1, eos=6.6% (therefore eos~300);  IgE level=16;  RAST tests= all NEG IMP/PLAN>>  Rachael Peck has mild to mod COPD based on her long hx asthma w/ fixed airway obstruction; I am surprised that her FeNO is that high=> we are evaluating further w/ CBC/ diff to check Eos & and IgE/ RAST to see if a biological would be appropriate for her; in the meanwhile we will add MEDROL 4mg  Qam (low dose due to her IFG/DM diagnosis); we plan ROV recheck in 4wks...  ADDENDUM>> with her norm IgE/RAST, and Eos~300 she would be a candidate for [facility] Rx & we will consider this in follow up...   ~  November 29, 2016:  80mo ROV &  Rachael Peck notes tha "EVERYTHING IS BETTER" stating "I can breathe" and "I can talk";  She is getting out & about now & doing OK- more mobile etc;  On Medrol4mg /d & tol well, Advair500Bid, Singulair10, NEBS w/ albut & Ipratop Tid, + prn Hycodan/ Allegra/ Flonase;  We discussed her LABS w/ incr eos at 6-7% and normal IgE/ neg RAST => she's a candidate for biologic like Nucala/ Cinqair/ Berna Bue but she declines due to expense & the fact that she is improved on the Pred...     Prob List reviewed...    EXAM shows Afeb, VSS, O2sat=97% on RA;  HEENT- neg, mallampati2;  Chest- clear w/o w/r/r;  Heart- RR w/o m/r/g;  Abd- soft, nontender, neg;  Ext- neg w/o c/c/e;  Neuro- intact...   LABS 11/29/16>  FeNO = 151 IMP/PLAN>>  The FeNO result is very surprising as she has been on the MEDROL 4mg  daily for ~29mo now AND she feels much better c/w a steroid benefit to her airways dis; we reviewed her prev lab results w/ norm IgE & RAST, and CBC w/ 6.6% eos before the Medrol- she is a candidate for Nucala/ Fasenra but declines the biologic & prefers to continue Medrol slow taper given her clinical response; in this regard we will decr her Medrol4mg  to 1 tab alternate w/ 1/2 tab for Feb & cut further to 4mg  Qod starting in March w/ ROV recheck in 63mo.Marland KitchenMarland Kitchen  Problem List:    ALLERGIC RHINITIS (ICD-477.9) - on ALLEGRA 180mg /d, & FLONASE Qhs...   CHRONIC OBSTRUCTIVE ASTHMA UNSPECIFIED (ICD-493.20) - on ADVAIR 250Bid (but not compliant),  PROAIR HFA Prn (using 2-3 times daily), ATROVENT 2spQid (started by the [facility] clinic in [place]), & MUCINEX 1-2Bid... ~  baseline CXR 11/08 showed sl hyperaeration, clear, NAD.Marland Kitchen. spondylitic changes in TSpine noted. ~  PFT 3/09 showed FVC= 3.36 (92%), FEV1= 2.13 (78%), %1sec= 63, mid-flows= 26% pred;  TLC= 6.61 (112%), RV= 3.25 (145%), RV/TLC= 49%;  DLCO= norm... ~  f/u CXR 8/10 showed min bibasilar atx, NAD... ~  PFT 2/11 showed FVC= 2.22 (73%), FEV1= 1.68 (70%), %1sec=76, mid-flows=  57%pred. ~  4/12:  80mo ROV here & she tells me she is getting her care thru the [facility] in [place]; just wants refills today- declines CXR, labs, etc; she hasn't been taking the Advair due to cost & we discussed regimen w/ ALBUTEROL 2spQid, ATROVENT 2spQid, FLOVENT 220- 2spBid, Singulair 10mg /d, Mucinex 2Bid, fluids, etc... ~  4/13:  Yearly ROV & she remains stable, same meds & followed in [place] as noted... ~  10/13:  Mild exac treated w/ her NEBS Qid, Advair250, Mucinex2Bid, Hycodan cough syrup... ~  7/14: on Allegra/ Flonase, NEBS w/ Albut/Ipratrop Qid, Advair250 (only uses samples due to $$) vs Flovent220-2slBid, Singulair10; ?gets meds in [place]? Asked to add Mucinex-2Bid + fluids. ~  4/15: presented w/ cough, yellow sput, incr SOB; CXR w/ RLL pneum; treated w/ Omnicef, Pred, Mucinex, etc & resolved... ~  5/15: follow up visit w/ resolution of pneumonia; encouraged to use meds regularly: on Allegra/ Flonase, NEBS w/ Albut/Ipratrop prn, Advair250Bid, Singulair10... ~  11/15: f/u visit w/ prominent LPR symptoms, choking, etc; we reviewed antireflux regimen, continue Advair250, NEBS, Singulair10, etc and refer to [facility] for further eval of her LPR/ choking episodes... ~  5/16: presents w/ AB exac and treated w/ Depo80, Pred dosepak, ZPak...  ~  CXR 5/16 showed norm heart size, clear lungs, NAD...  ~  Spirometry 5/16 showed FVC=2.16 (73%), FEV1=1.42 (63%), %1sec=66, mid-flows are reduced at 30% predicted; c/w mod airflow obstruction and GOLD Stage 2 COPD   FOLLOWED FOR PRIMARY CARE by [place] [facility] >>   HYPERTENSION (ICD-401.9) - controlled on ATENOLOL100, AMLODIPINE10-1/2, HCTZ12.5, KCl 10-2Bid;  BP= 134/76, we do not have notes from her Primary...  CHEST PAIN, ATYPICAL (ICD-786.59) - on ASA 81mg /d... she denies recurrent CP, exertional CP, etc...  PALPITATIONS (ICD-785.1) - hx palpitations in the past... no recent symptoms on meds above & she knows to avoid caffeine,  etc...  OVERWEIGHT (ICD-278.02) - we reviewed diet + exercise; weight = 205# w/ BMI= 31-2...  GERD (ICD-530.81) - on ZANTAC 300mg Qhs... she has a hx of GERD and LPR prev controlled on these meds but now symptoms have returned w/ prominent choking episodes & resp exac...   DIVERTICULOSIS OF COLON (ICD-562.10) - on Levsin prn; last colonoscopy 7/04 by DrJEdwards- showed divertics only; she now gets her GI follow up in [place]...  BREAST CANCER >> she noted blood in her bra from nipple disch, initial mammogram in [place] was neg but check up from her primary doctor ([facility] in [place]) lead to a bx from surg at [facility] w/ Dx of Breast Cancer> Surg- DrMcNutt (initial excision followed by 2nd surg for pos margins), XRT- DrBrown, Oncology- DrSwift now on Tamoxifen & doing very well by her report...  ~  See notes in Care Everywhere=> reviewed...  STRESS INCONTINENCE (  ICD-788.39) - she claims leakage of urine and stool- she has been recommended to f/u w/ GYN for this eval....  TIA >> on ASA 81mg /d & prev on Plavix as well...  ANXIETY (ICD-300.00) - states that she is under stress due to her condition and having to care for her mother (on hospice w/ renal failure)... Rx for ALPRAZOLAM 0.25mg  Tid Prn written...   Past Surgical History:  Procedure Laterality Date  . ABDOMINAL HYSTERECTOMY    . BREAST LUMPECTOMY  12/17/2012 & 01/14/2013    Outpatient Encounter Prescriptions as of 10/04/2016  Medication Sig  . albuterol (PROVENTIL) (2.5 MG/3ML) 0.083% nebulizer solution Take 3 mLs (2.5 mg total) by nebulization 2 (two) times daily.  Marland Kitchen albuterol (VENTOLIN HFA) 108 (90 Base) MCG/ACT inhaler INHALE ONE TO TWO PUFFS INTO LUNGS EVERY 4 TO 6 HOURS AS NEEDED  . ALPRAZolam (XANAX) 0.25 MG tablet Take 1 tablet (0.25 mg total) by mouth 3 (three) times daily as needed.  Marland Kitchen amLODipine (NORVASC) 10 MG tablet Take 5 mg by mouth daily.   . ARTIFICIAL TEARS ophthalmic solution As needed for dry eyes   .  aspirin (BAYER LOW STRENGTH) 81 MG EC tablet Take 81 mg by mouth daily.    Marland Kitchen atenolol (TENORMIN) 100 MG tablet Take 100 mg by mouth daily.  . cholecalciferol (VITAMIN D) 1000 units tablet Take 1,000 Units by mouth daily.  . fexofenadine (ALLEGRA) 180 MG tablet Take 1 tablet (180 mg total) by mouth daily. Once a day as needed for allergies  . fluticasone (FLONASE) 50 MCG/ACT nasal spray USE TWO SPRAYS IN EACH NOSTRIL ONE TIME DAILY  . Fluticasone-Salmeterol (ADVAIR DISKUS) 500-50 MCG/DOSE AEPB Inhale 1 puff into the lungs 2 (two) times daily.  . Fluticasone-Salmeterol (ADVAIR DISKUS) 500-50 MCG/DOSE AEPB Inhale 1 puff into the lungs 2 (two) times daily.  . hydrochlorothiazide (HYDRODIURIL) 12.5 MG tablet Take 12.5 mg by mouth daily.  Marland Kitchen HYDROcodone-homatropine (HYDROMET) 5-1.5 MG/5ML syrup Take 5 mLs by mouth every 6 (six) hours as needed for cough.  . hyoscyamine (LEVSIN) 0.125 MG tablet Take 1 tablet (0.125 mg total) by mouth 3 (three) times daily as needed for cramping.  Marland Kitchen ipratropium (ATROVENT) 0.02 % nebulizer solution INHALE THE CONTENTS OF 1 VIAL VIA NEBULIZER THREE TIMES DAILY  . loratadine (CLARITIN) 10 MG tablet Take 10 mg by mouth daily as needed.  . meclizine (ANTIVERT) 25 MG tablet TAKE ONE TABLET BY MOUTH THREE TIMES DAILY AS NEEDED FOR DIZZINESS OR NAUSEA.  . montelukast (SINGULAIR) 10 MG tablet Take 1 tablet (10 mg total) by mouth at bedtime.  . pantoprazole (PROTONIX) 40 MG tablet TAKE 1 TABLET DAILY  30 MINUTES PRIOR TO BREAKFAST  . potassium chloride (MICRO-K) 10 MEQ CR capsule TAKE 2 CAPSULES (20 MEQ TOTAL) BY MOUTH 2 (TWO) TIMES DAILY.  . ranitidine (ZANTAC) 300 MG tablet TAKE 1 TABLET AT BEDTIME  . tamoxifen (NOLVADEX) 20 MG tablet Take 20 mg by mouth daily.  . traMADol (ULTRAM) 50 MG tablet Take 1 tablet (50 mg total) by mouth 3 (three) times daily as needed (cough).  . traZODone (DESYREL) 50 MG tablet Take 50 mg by mouth at bedtime. For sleep   . vitamin B-12  (CYANOCOBALAMIN) 1000 MCG tablet Take 1,000 mcg by mouth daily.    No Known Allergies   Review of Systems         See HPI - all other systems neg except as noted... The patient complains of dyspnea on exertion, incontinence, and difficulty walking.  The patient denies anorexia, fever, weight loss, weight gain, vision loss, decreased hearing, hoarseness, chest pain, syncope, peripheral edema, prolonged cough, headaches, hemoptysis, abdominal pain, melena, hematochezia, severe indigestion/heartburn, hematuria, muscle weakness, suspicious skin lesions, transient blindness, depression, unusual weight change, abnormal bleeding, enlarged lymph nodes, and angioedema.     Objective:   Physical Exam     WD, Overweight, 71 y/o BF in NAD... GENERAL:  Alert & oriented; pleasant & cooperative...  HEENT:  [place]/AT, EOM-wnl, PERRLA, EACs-clear, TMs-wnl, NOSE-clear, THROAT-clear & wnl. NECK:  Supple w/ fairROM; no JVD; normal carotid impulses w/o bruits; no thyromegaly or nodules palpated; no lymphadenopathy. CHEST:  decr BS bilat but clear- no wheezing/ rales/ rhonchi/ or signs of consolidation... HEART:  Regular Rhythm; without murmurs/ rubs/ or gallops detected... ABDOMEN:  Obese, sl distended, decr BS, & tender epig; no organomegaly or masses palpated... EXT: without deformities, mild arthritic changes; no varicose veins/ +venous insuffic/ no edema. NEURO:  CN's intact;  no focal neuro deficits... DERM:  No lesions noted; no rash etc...  RADIOLOGY DATA:  Reviewed in the EPIC EMR & discussed w/ the patient...  LABORATORY DATA:  Reviewed in the EPIC EMR & discussed w/ the patient...   ASSESSMENT & PLAN:    AR & ASTHMA>  Her chronic obstructive asthma has been better regulated w/ meds from [facility] [place] [facility] w/ their samples etc; compliance has been an issue in the past; now supposed to be on Advair500Bid, NEBS w/ Albut & Ipratrop Tid, Singulair10, Hycodan prn; she has required occas  rounds of antibiotics and Pred in the past so we will remind her to use the NEBULIZER w/ both meds Tid regularly + the Advair500Bid...  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  ADDENDUM>> with her norm IgE/RAST, and Eos~300 she would be a candidate for [facility] Rx & we will consider this in follow up... 11/29/16>   The FeNO result is very surprising as she has been on the MEDROL 4mg  daily for ~85mo now AND she feels much better c/w a steroid benefit to her airways dis; we reviewed her prev lab results w/ norm IgE & RAST, and CBC w/ 6.6% eos before the Medrol- she is a candidate for Nucala/ Fasenra but declines the biologic & prefers to continue Medrol slow taper given her clinical response; in this regard we will decr her Medrol4mg  to 1 tab alternate w/ 1/2 tab for Feb & cut further to 4mg  Qod starting in March w/ ROV recheck in 65mo.  GI> Hx GERD, Divertics> more abd findings than chest findings today- hx diarrhea, distended, decr BS, epig tender; we will add PPI- Protonix40, continue Zantac300, add Align to her Activia 7 plan f/u appt w/ GI- [facility]Conway [place]...  << Medical problems followed by her primary care team & specialists at [facility] >>  HBP>   AtypCP & Palpit>  Breast Cancer>   Stress Incont>  TIA>   Anxiety>  She needs to take the Alpraz0.25 tid as discussed...   Patient's Medications  New Prescriptions   FLUTICASONE-SALMETEROL (ADVAIR DISKUS) 500-50 MCG/DOSE AEPB    Inhale 1 puff into the  lungs 2 (two) times daily.  Previous Medications   ALBUTEROL (PROVENTIL) (2.5 MG/3ML) 0.083% NEBULIZER SOLUTION    Take 3 mLs (2.5 mg total) by nebulization 2 (two) times daily.    ALBUTEROL (VENTOLIN HFA) 108 (90 BASE) MCG/ACT INHALER    INHALE ONE TO TWO PUFFS INTO LUNGS EVERY 4 TO 6 HOURS AS NEEDED   ALPRAZOLAM (XANAX) 0.25 MG TABLET    Take 1 tablet (0.25 mg total) by mouth 3 (three) times daily as needed.   AMLODIPINE (NORVASC) 10 MG TABLET    Take 5 mg by mouth daily.    ARTIFICIAL TEARS OPHTHALMIC SOLUTION    As needed for dry eyes    ASPIRIN (BAYER LOW STRENGTH) 81 MG EC TABLET    Take 81 mg by mouth daily.     CHOLECALCIFEROL (VITAMIN D) 1000 UNITS TABLET    Take 1,000 Units by mouth daily.   FEXOFENADINE (ALLEGRA) 180 MG TABLET    Take 1 tablet (180 mg total) by mouth daily. Once a day as needed for allergies   FLUTICASONE (FLONASE) 50 MCG/ACT NASAL SPRAY    USE TWO SPRAYS IN EACH NOSTRIL ONE TIME DAILY   FLUTICASONE-SALMETEROL (ADVAIR DISKUS) 500-50 MCG/DOSE AEPB    Inhale 1 puff into the lungs 2 (two) times daily.   HYDROCHLOROTHIAZIDE (HYDRODIURIL) 12.5 MG TABLET    Take 12.5 mg by mouth daily.   HYDROCODONE-HOMATROPINE (HYCODAN) 5-1.5 MG/5ML SYRUP    Take 5 mLs by mouth every 6 (six) hours as needed for cough.   LORATADINE (CLARITIN) 10 MG TABLET    Take 10 mg by mouth daily as needed.   METHYLPREDNISOLONE (MEDROL) 4 MG TABLET    Take 1 tablet (4 mg total) by mouth daily.   METOPROLOL (LOPRESSOR) 100 MG TABLET    Take 100 mg by mouth daily.   MONTELUKAST (SINGULAIR) 10 MG TABLET    TAKE 1 TABLET AT BEDTIME   PANTOPRAZOLE (PROTONIX) 40 MG TABLET    TAKE 1 TABLET DAILY  30 MINUTES PRIOR TO BREAKFAST   POTASSIUM CHLORIDE (MICRO-K) 10 MEQ CR CAPSULE    TAKE 2 CAPSULES (20 MEQ TOTAL) 2 TIMES DAILY.    RANITIDINE (ZANTAC) 300 MG TABLET    TAKE 1 TABLET AT BEDTIME   TAMOXIFEN (NOLVADEX) 20 MG TABLET    Take 20 mg by mouth daily.   TRAMADOL (ULTRAM) 50 MG TABLET    Take 1 tablet (50 mg total) by mouth 3 (three) times daily as needed.   TRAZODONE (DESYREL) 50 MG TABLET    Take 50 mg by mouth at bedtime. For sleep    VITAMIN B-12 (CYANOCOBALAMIN) 1000 MCG TABLET     Take 1,000 mcg by mouth daily.  Modified Medications   Modified Medication Previous Medication   HYOSCYAMINE (LEVSIN) 0.125 MG TABLET hyoscyamine (LEVSIN) 0.125 MG tablet      Take 1 tablet (0.125 mg total) by mouth 3 (three) times daily as needed for cramping.    Take 1 tablet (0.125 mg total) by mouth 3 (three) times daily as needed for cramping.   IPRATROPIUM (ATROVENT) 0.02 % NEBULIZER SOLUTION ipratropium (ATROVENT) 0.02 % nebulizer solution      INHALE THE CONTENTS OF 1 VIAL VIA NEBULIZER THREE TIMES DAILY    INHALE THE CONTENTS OF 1 VIAL VIA NEBULIZER THREE TIMES DAILY   MECLIZINE (ANTIVERT) 25 MG TABLET meclizine (ANTIVERT) 25 MG tablet      TAKE ONE TABLET BY MOUTH THREE TIMES DAILY AS NEEDED FOR DIZZINESS OR NAUSEA.  TAKE ONE TABLET BY MOUTH THREE TIMES DAILY AS NEEDED FOR DIZZINESS OR NAUSEA.  Discontinued Medications   ATENOLOL (TENORMIN) 100 MG TABLET    Take 100 mg by mouth daily.   FLUTICASONE-SALMETEROL (ADVAIR DISKUS) 500-50 MCG/DOSE AEPB    Inhale 1 puff into the lungs 2 (two) times daily.   HYDROCODONE-HOMATROPINE (HYDROMET) 5-1.5 MG/5ML SYRUP    Take 5 mLs by mouth every 6 (six) hours as needed for cough.   TRAMADOL (ULTRAM) 50 MG TABLET    Take 1 tablet (50 mg total) by mouth 3 (three) times daily as needed (cough).

## 2016-12-13 ENCOUNTER — Telehealth: Payer: Self-pay | Admitting: Pulmonary Disease

## 2016-12-13 NOTE — Telephone Encounter (Signed)
intitiaed the PA for the meclizine.  This was denied.  SN please advise on a change of meclizine.  thanks

## 2016-12-13 NOTE — Telephone Encounter (Signed)
Spoke with pt, who states her insurance will not cover hyoscyamine 0.125, covered alternative is dicyclomine 20mg . Pt is requesting Rx be sent to [facility] mail if SN agrees with alternative.  SN please advise. Thanks.

## 2016-12-14 NOTE — Telephone Encounter (Signed)
Spoke with the pt and notified of recs per SN and she verbalized understanding and nothing further needed

## 2016-12-14 NOTE — Telephone Encounter (Signed)
Per SN---  The PA was denied for meclizine via her insurance company.  This is available otc for 25 mg  1 po every 8 hours as needed for dizziness.  Also called bonine 25 mg.

## 2016-12-19 MED ORDER — DICYCLOMINE HCL 20 MG PO TABS: 20.0000 mg | ORAL_TABLET | Freq: Three times a day (TID) | ORAL | 0 refills | Status: DC | PRN

## 2016-12-19 NOTE — Telephone Encounter (Signed)
Per SN-dicyclomine 20mg  tid prn 90 tab. Rx sent to preferred pharmacy. Pt aware and voiced her understanding.

## 2017-01-20 IMAGING — DX DG CHEST 2V
2 series · 2 of 2 positions shown · non-contrast
Comparison: March 10, 2015

CLINICAL DATA: Asthma

EXAM:
CHEST  2 VIEW

[chest pa]
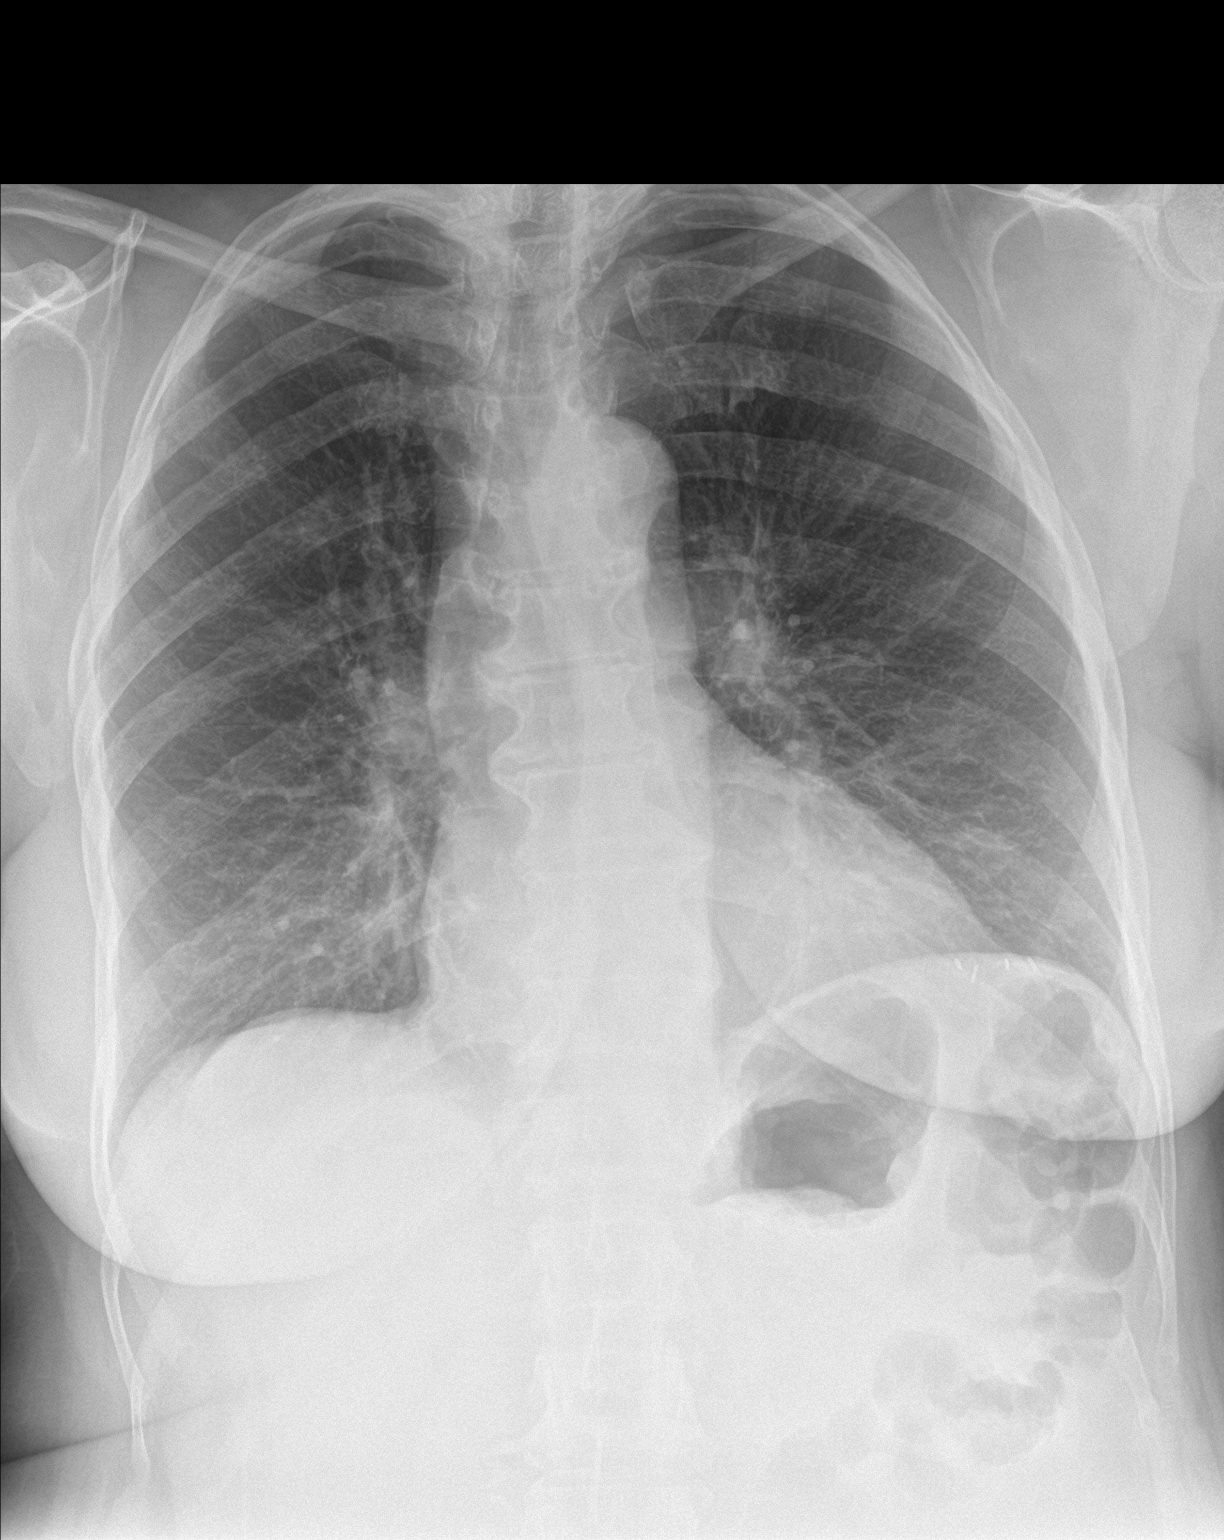

[chest lat]
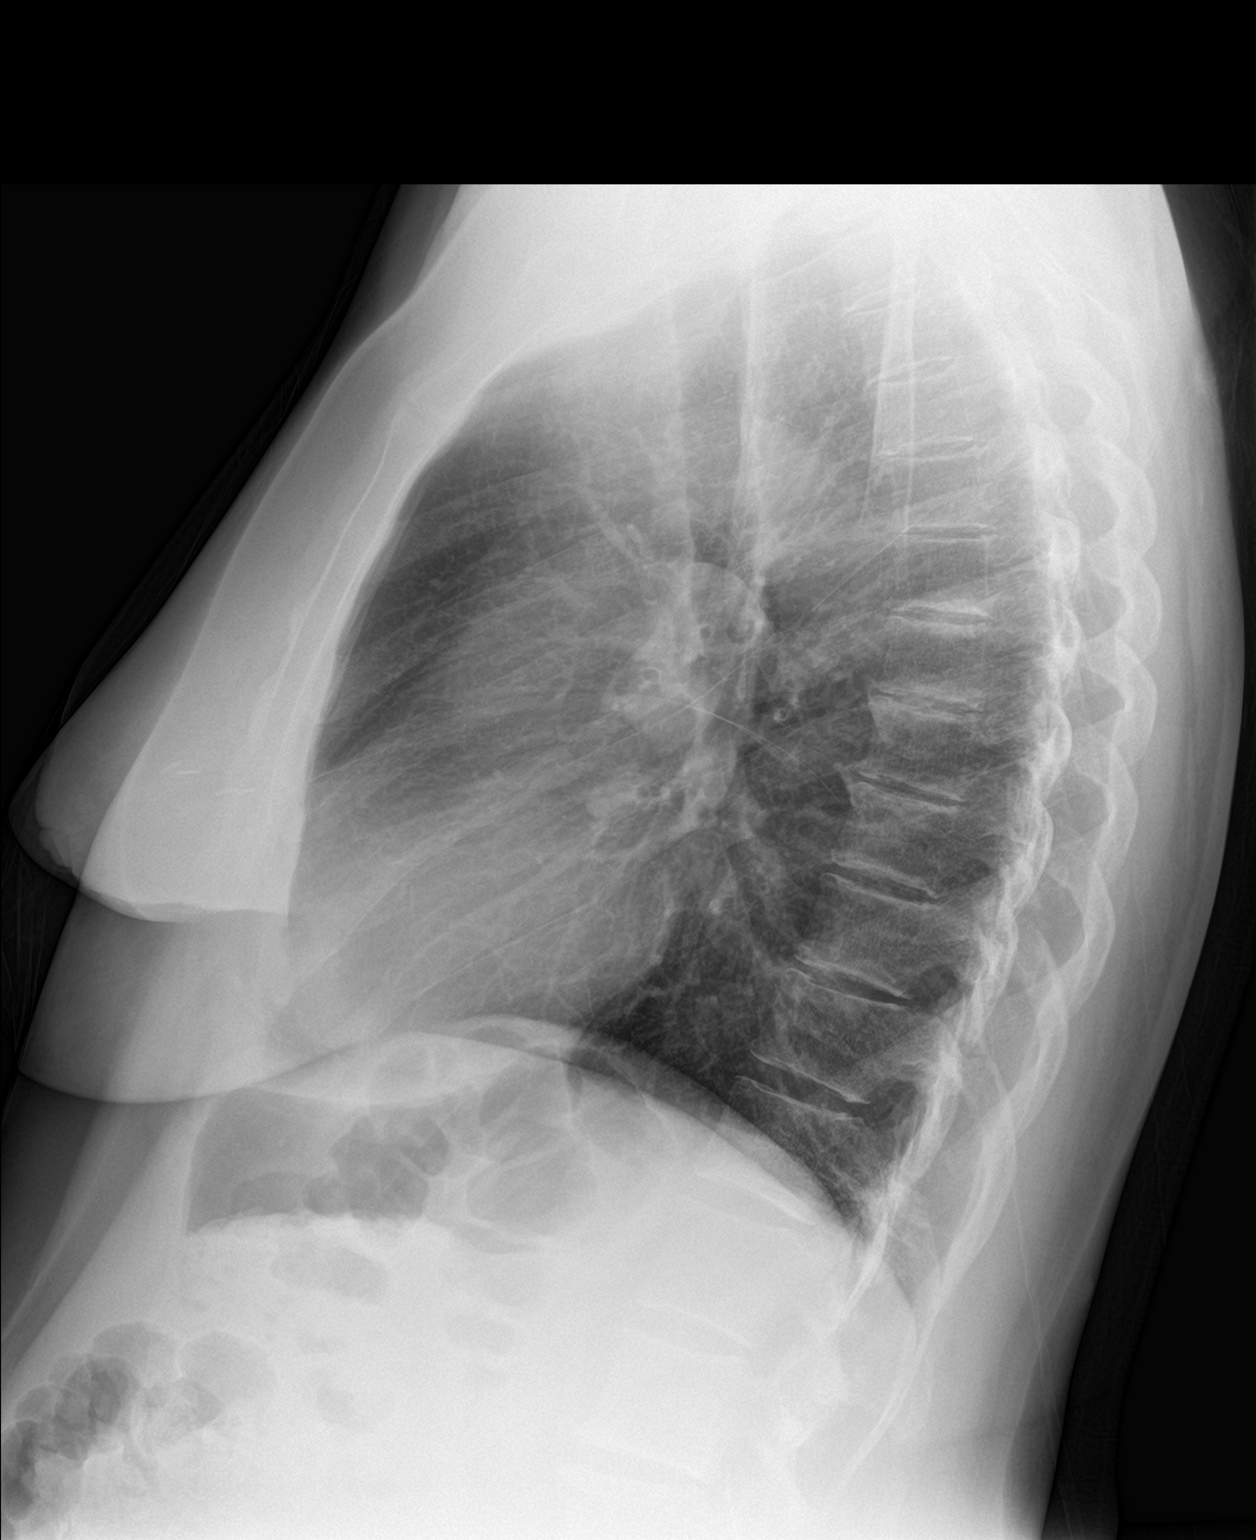

[2 of 2 positions shown; findings below may reference images not displayed]

FINDINGS: There is no edema or consolidation. The heart size and pulmonary
vascularity are normal. No adenopathy. There are surgical clips in
the left breast region. Patient has had a previous lumpectomy on the
left. There is degenerative change in the thoracic spine.
IMPRESSION: No edema or consolidation. Postoperative change left breast. Stable
cardiac silhouette.

## 2017-02-07 ENCOUNTER — Ambulatory Visit (INDEPENDENT_AMBULATORY_CARE_PROVIDER_SITE_OTHER): Payer: Medicare HMO | Admitting: Pulmonary Disease

## 2017-02-07 ENCOUNTER — Encounter: Payer: Self-pay | Admitting: Pulmonary Disease

## 2017-02-07 VITALS — BP 130/76 | HR 92 | Temp 97.6°F | Wt 199.5 lb

## 2017-02-07 DIAGNOSIS — K219 Gastro-esophageal reflux disease without esophagitis: Secondary | ICD-10-CM

## 2017-02-07 DIAGNOSIS — J449 Chronic obstructive pulmonary disease, unspecified: Secondary | ICD-10-CM

## 2017-02-07 DIAGNOSIS — F411 Generalized anxiety disorder: Secondary | ICD-10-CM

## 2017-02-07 DIAGNOSIS — J4489 Other specified chronic obstructive pulmonary disease: Secondary | ICD-10-CM

## 2017-02-07 DIAGNOSIS — E663 Overweight: Secondary | ICD-10-CM

## 2017-02-07 LAB — POCT EXHALED NITRIC OXIDE: FeNO level (ppb): 178

## 2017-02-07 MED ORDER — METHYLPREDNISOLONE 4 MG PO TABS: 4.0000 mg | ORAL_TABLET | ORAL | 1 refills | Status: DC

## 2017-02-07 MED ORDER — FLUTICASONE-SALMETEROL 500-50 MCG/DOSE IN AEPB: 1.0000 | INHALATION_SPRAY | Freq: Two times a day (BID) | RESPIRATORY_TRACT | 0 refills | Status: DC

## 2017-02-07 NOTE — Progress Notes (Signed)
Subjective:    Patient ID: Rachael Peck, female    DOB: 01/15/1946, 71 y.o.   MRN: [id number]  HPI 71 y/o BF here for a follow up visit... she has multiple medical problems including:  AR;  Asthma;  HBP;  Hx atypCP & palpit;  Overweight;  GERD;  Divertics;  Stress incontinence;  Anxiety... [place] in [place] and she tells me she is followed for general medical purposes in the [facility] & was referred to [place] for cardiac eval- we do not have any of these records- she denies recurrent CP, palpit, etc...  ~  SEE PREV EPIC NOTES FOR OLDER DATA >>    Only labs/ XRays here were in 2010 as she assures me that XRays, EKGs, LABS all done at [facility] (we have requested records)...  CXR 5/15 showed improved RLL & no evid of pneumonia seen, sl peribronch thickening, otherw neg/ NAD...  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   LABS are all done at [facility]  ~  August 21, 2014:  2mo ROV and Rachael Peck has been doing reasonably well- but notes recent resp exac w/ choking spells at night assoc w/ cough, chest tightness, some wheezing, "hard to breathe", etc;  I am very suspicious for LPR, reflux related resp exac, & she notes "choking more";  She has been on diet (weight unchanged), taking Advair250Bid, NEBS w/ Duoneb Bid, Singulair10, and using Hycodan/ Tessalon prn;  She also takes [place] regularly;  For the chronic GERD/ reflux she is on ZANTAC300 Qhs, and Levsin0.125mg  prn...  She continues her regular medical f/u [place] and specialty f/u at [facility]...  Exam shows clear chest, sl decr BS bilat but no rales, rhonchi, or signs of consolidation... Last CXR was 5/15 showing norm heart size, clear lungs x mild incr markings at bases, NAD, mod thoracic spondylosis...     We reviewed prob list, meds, xrays; note- labs all done by her PrimaryCare in [place]>>  PLAN>>  We discussed need for further GI eval of her LPR,  choking episodes, etc because they are clearly related to her AB exac, coughing, etc;  Rec referral to [facility] (she has seen them in the past) for further eval- ?Ba swallow, EGD, etc;  In the meanwhile continue Rx w/ Advair250, NEBS w/ Duoneb, Singulair, and vigorous antireflux regimen...   ~  Mar 10, 2015:  16mo ROV & f/u Asthma w/ component of fixed obstructive dis> at time of her last OV we referred her to [facility] (as above) but she never went;  Presents today c/o 3d hx cough, sm amt clear white sput, no hemoptysis, mild SOB & chest discomfort, denies f/c/s and she is vague/ not sure what brought this on/ etc... We reviewed the following medical problems during today's office visit >>     AR/ Asthma> on Allegra/ Flonase, NEBS w/ Albut/Ipratrop prn, Advair250Bid, Singulair10; ?[place]? Asked to add Mucinex-2Bid + fluids; 5/16 presents w/ ABexac=> Rx w/ Depo80, dosepak, ZPak...     HBP> on Aten100, Amlod10-1/2, HCT12.5 & K10-2Bid; BP= 134/76 & she denies CP, palpit, ch in SOB, edema...    Overweight> "Im watching my diet" and weight = 205# (no real change) & BMI=31; we reviewed diet & exercise strategies...    GI> GERD, Divertics> on Zantac300Qhs, & Levsin prn; denies abd pain, n/v, c/d, blood seen; she is due for a follow up  colonoscopy- encouraged to set this up thru her contacts at [facility]...    GU> Stress Incont> prev on Oxybut but off now; she states voiding is better & leaking less, doesn't want meds...    BREAST CANCER> Dx and treated at [facility] in 2014> Surgery, XRT, Oncology- on Tamoxifen now... NO OLD RECORDS AVAIL IN CareEverywhere?    TIA> on ASA81; followed at [facility], she denies cerebral ischemic symptoms...    Anxiety> on Trazodone50Qhs, Alprazolam0.25mg  prn; she is generally stable... We reviewed prob list, meds, xrays and labs> see below for updates >>   CXR 5/16 showed norm heart size, clear lungs, NAD...   Spirometry 5/16 showed FVC=2.16 (73%), FEV1=1.42 (63%), %1sec=66,  mid-flows are reduced at 30% predicted; c/w mod airflow obstruction and GOLD Stage 2 COPD...     IMP/PLAN>>  Rachael Peck appears to have an AB exac, and an element of fixed obstruction due to her poorly controlled asthma over the yrs; Rec to treat w/ Depo80, Pred dosepak, & ZPak at this time; she will continue to use her NEBULIZER w/ Duoneb Tid, NOIBBC488 Bid, Singulair10, etc...   ~  August 10, 2015:  53mo ROV & add-on appt requested by pt> presents c/o feeling bad over the past month, assoc w/ incr cough & mucus; she called & we phoned in Pred which helped; now c/o an "attack" today w/ incr cough, clear phlegm, no hemoptysis, and incr SOB although she is quite vague & unsure of her symptoms- says she had some tightness but "just a little wheezing"; on exam she is in no distress and chest is clear w/o wheezing/ rales/ rhonchi; her abd however is sl distended w/ +epig tenderness on palpation assoc w/ decr BS; she notes recent diarrhea & taking Activia, notes some left sided abd discomfort but denies reflux, dysphagia, n/v, blood seen, etc...     AR/ Asthma> on Allegra/ Flonase, NEBS w/ Albut/Ipratrop Bid-Tid, Advair250Bid, Singulair10; ?[place]? Prev asked to add Mucinex-2Bid + fluids; presents 10/16 w/ incr SOB but chest is clear & just finished Pred called in for her; we decided to incr to ADVAIR500-Bid + use the NEBS Tid regularly...    HBP> on Aten100, Amlod10-1/2, HCT12.5 & K10-2Bid; BP= 140/72 & she denies CP, palpit, edema...    Overweight> "Im watching my diet" and weight down 6# to 199# & BMI=30; we reviewed diet & exercise strategies...    GI> GERD, Divertics> on Zantac300Qhs, & Levsin prn; she has epig tender, distention, decr BS; she is due for a follow up colonoscopy- we will refer to her GI in [place] [facility]...    GU> Stress Incont> prev on Oxybut but off now; she states voiding is better & leaking less, doesn't want meds...    BREAST CANCER> Dx and treated at [facility] in 2014> Surgery,  XRT, Oncology- on Tamoxifen now... NO OLD RECORDS AVAIL IN CareEverywhere?    TIA> on ASA81; followed at [facility], she denies cerebral ischemic symptoms...    Anxiety> on Trazodone50Qhs, Alprazolam0.25mg  prn; she is quite stressed & asked to take the alpraz0.25 tid to help...  We reviewed prob list, meds, xrays and labs> see below for updates >>     PLAN>>  We reviewed her prev CXR & spirometry- several interval calls w/ Pred called in for her; currently c/o "attack" today but exam is clear w/o tightness or wheezing; we decided to incr the ADVAIR to the 500 inhaler- still one inhalation Bid & she is encouraged to take the Alpraz0.25 tid to help  w/ her nerves;  She alos has on-going GI issues- c/o diarrhea but exam is distended, sl tender, decr BS- encouraged to follow antireflux regimen, add PROTONIX40 Qam, take the Zantac Qhs, add Align to the Activia, and f/u w/ her GI=DrConway in [place]...  ~  March 29, 2016:  7-95mo ROV & Rachael Peck returns for a pulmonary follow up visit>  She tells me that she has recently been house-bound due to the springtime/ pollen/ trees and notes coughing and "resp attacks";  She has been taking Advair500Bid, Singulair10, NEBS w/ Albut & Ipratropium Bid, VentolinHFA (using it 3-4 times daily), plus her Allegra180 & Flonase=> and she notes very joyously that "all this has brought me back to life" but she is requesting a regular cough medication (we discussed trial TRAMADOL 50mg  Tid prn);  She has not required any ER visits/ hospitalizations/ nor has she called here or [facility] for additional meds/ steroids/ etc... 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     12/30/15> she was seen in the [facility] clinic by DrAvery & notes to be doing well on her Tamoxifen, few hot flashes, note reviewed- exam was OK, Labs OK, Mammogram OK, & they plan ROV 20mo...    11/26/15> she was seen in the GI clinic by [facility], GERD, LPR; on Protonix40, Zantac300, +lifestyle modifications and she was much improved; they plan recheck in 83mo & try to get her to reduce the PPI dose... EXAM shows Afeb, VSS, O2sat=100% on RA;  HEENT- neg, mallampati2;  Chest- clerar w/o w/r/r;  Heart- RR w/o m/r/g;  Abd- soft, nontender, neg;  Ext- neg w/o c/c/e;  Neuro- intact...   LABS 12/30/15 at [facility]>  Chems- wnl;  CBC- wnl w/ Hg=12.4, MCV=86, WBC=3.9  Mammogram 02/04/16 at [facility]>  Benign, scat fibroglandular densities, no suspicious mass or calcif seen, s/p lumpectomy on left  CXR 03/29/16>  Norm heart size, clear lungs- NAD, post op changes in left breast area... IMP/PLAN>>  Despite her intermittent symptoms- Srinidhi appears stable from the pulmonary standpoint & hasn't required ER visits, hospitalizations, office phone calls for additional meds, etc; she is pleased w/ her current med regimen & just wants to continue this the same=> meds refilled today & new Rx for TRAMADOL50mg Tid to try for her cough... We plan ROV in 41mo, sooner if needed for acute problems...   ~  October 04, 2016:  33mo ROV and pulmonary follow up visit>  Rachael Peck continues her medical follow up in [place] but states she has several issues se wishes to discuss w/ me- she is still on Aten100 but is being switched to Metoprolol when out;  She has noted an incr cough for the last 61mo, sore in her sides from coughing, chr stable SOB/DOE "not bad" she says, no f/c/s- she  wants a cough syrup...    Interval records from [place] reviewed in epic & summarized  here=> she has a long list of providers!    Medical f/u by PA-Cooper in [facility] 08/31/16> COPD, HBP, DM, OA- doing well, active, no issues;  FLP 11/17 showed TChol 143, TG 366, HDL 48, LDL 43 on diet alone...     She had f/u GI- [facility] 07/21/16> GERD- on Protonix40 & Zantac150Bid, pt was reluctant to decr meds & they decided to continue same...    She was seen by [facility] NP-[facility] 06/29/16> left breast cancer dx 12/2012 (DCIS- ER/PR pos)& s/p left lumpectomy3/3/14, re-excision of pos margins 01/14/13, aqdjuvant XRT completed 03/25/13, Tamoxifen started 03/2013- c/o hot flashes... EXAM shows Afeb, VSS, O2sat=97% on RA;  HEENT- neg, mallampati2;  Chest- clear w/o w/r/r;  Heart- RR w/o m/r/g;  Abd- soft, nontender, neg;  Ext- neg w/o c/c/e;  Neuro- intact...   CXR 10/04/16>  SHE DID NOT GO TO XRAY for this film; Last CXR 03/29/16 reviewed by me in PACS- norm heart size, clear lungs, post op changes in left breast area, degen changes in Tspine...  Spirometry 10/04/16>  FVC=2.17 (75%), FEV1=1.44 (64%), %1sec=66, mid-flows reduced at 37% predicted   This is c/w a mod obstructive ventilatory defect...  FeNO 10/04/16>  FeNO = 125 => we decided to continue Advair500 & add Medrol4mg /d  LABS 10/04/16>  CBC- Hg=11.9, wbc=4.1, eos=6.6% (therefore eos~300);  IgE level=16;  RAST tests= all NEG IMP/PLAN>>  Rachael Peck has mild to mod COPD based on her long hx asthma w/ fixed airway obstruction; I am surprised that her FeNO is that high=> we are evaluating further w/ CBC/ diff to check Eos & and IgE/ RAST to see if a biological would be appropriate for her; in the meanwhile we will add MEDROL 4mg  Qam (low dose due to her IFG/DM diagnosis); we plan ROV recheck in 4wks...  ADDENDUM>> with her norm IgE/RAST, and Eos~300 she would be a candidate for [facility] Rx & we will consider this in follow up...  ~  November 29, 2016:  24mo ROV &  Rachael Peck notes tha "EVERYTHING IS BETTER" stating "I can breathe" and "I can talk";  She is getting out & about now & doing OK- more mobile etc;  On Medrol4mg /d & tol well, Advair500Bid, Singulair10, NEBS w/ albut & Ipratop Tid, + prn Hycodan/ Allegra/ Flonase;  We discussed her LABS w/ incr eos at 6-7% and normal IgE/ neg RAST => she's a candidate for biologic like Nucala/ Cinqair/ Berna Bue but she declines due to expense & the fact that she is improved on the Pred...     Prob List reviewed...    EXAM shows Afeb, VSS, O2sat=97% on RA;  HEENT- neg, mallampati2;  Chest- clear w/o w/r/r;  Heart- RR w/o m/r/g;  Abd- soft, nontender, neg;  Ext- neg w/o c/c/e;  Neuro- intact...   LABS 11/29/16>  FeNO = 151 IMP/PLAN>>  The FeNO result is very surprising as she has been on the MEDROL 4mg  daily for ~40mo now AND she feels much better c/w a steroid benefit to her airways dis; we reviewed her prev lab results w/ norm IgE & RAST, and CBC w/ 6.6% eos before the Medrol- she is a candidate for Nucala/ Fasenra but declines the biologic & prefers to continue Medrol slow taper given her clinical response; in this regard we will decr her Medrol4mg  to 1 tab alternate w/ 1/2 tab for Feb & cut further to 4mg  Qod starting in March w/ ROV recheck in 88mo...    ~  February 07, 2017:  85mo  ROV & pulmonary follow up visit... Marcell states that her breathing was better & she was actively doing her regimen but then developed "siatica" & couldn't walk- PCP rx w/ Naprosyn & PT;  She also ran out of her Medrol (was on 4mg  qod) ~2wks ago & since then she notes that pollen is bothering her w/ several attacks, using her rescue inhaler more often, & she notes that she is in the "donut hole for Advair ($1000 per month she says)... In addition she says that her sister has a travel company & she is going to travel w/ her & she would like to have oxygen to take "just in case";  I explained Medicare's requirements for home O2 as she cannot self-pay &  offered ambulatory oximetry to see if she qualifies for O2...  We reviewed the following medical problems during today's office visit >>     AR/ Asthma> on Medrol- weaned to 4mg Qod, Allegra/ Flonase, NEBS w/ Albut/Ipratrop Bid-Tid, Advair500Bid, Singulair10; ?gets meds in [place]? Prev asked to add Mucinex-2Bid + fluids; she freq runs out, compliance is poor...    HBP> on Metop100, Amlod10-1/2, HCT12.5 & K10-2Bid; BP= 130/76 & she denies CP, palpit, edema...    Overweight> "Im watching my diet" and weight stable at 200# & BMI=30; we reviewed diet & exercise strategies...    GI> GERD, Divertics> on Protonix40 + Zantac300Qhs, & Bentyl prn; she has epig tender, distention, decr BS; she is followed by GI in [place] & we do not have records...    GU> Stress Incont> prev on Oxybut but off now; she states voiding is better & leaking less, doesn't want meds...    BREAST CANCER> Dx and treated at [facility] in 2014> Surgery, XRT, Oncology- on Tamoxifen now... NO OLD RECORDS AVAIL IN CareEverywhere?    TIA> on ASA81; followed at [facility], she denies cerebral ischemic symptoms...    Anxiety> on Trazodone50Qhs, Alprazolam0.25mg  prn; she is quite stressed & asked to take the Alpraz0.25 tid to help...  EXAM shows Afeb, VSS, O2sat=97% on RA;  HEENT- neg, mallampati2;  Chest- clear w/o w/r/r;  Heart- RR w/o m/r/g;  Abd- soft, nontender, neg;  Ext- neg w/o c/c/e;  Neuro- intact...   Ambulatory Oximetry 02/07/17>  O2sat=95% on RA at rest w/ pulse=96/min;  She ambulated just 2 Laps (185'ea) & stopped due to fatigue & pain; lowest O2sat=96% w/ pulse=108/min IMP/PLAN>>  We decided to refill her Medrol 4mg  one tab QOD & encourage compliance w/ Advair500Bid, NEBS w/ albut/ipratrop, Singulair10, etc... We will plan rov recheck in 53mo...          Problem List:    ALLERGIC RHINITIS (ICD-477.9) - on ALLEGRA 180mg /d, & FLONASE Qhs...   CHRONIC OBSTRUCTIVE ASTHMA UNSPECIFIED (ICD-493.20) - on ADVAIR 250Bid (but not compliant),  PROAIR HFA  Prn (using 2-3 times daily), ATROVENT 2spQid (started by the [facility] clinic in [place]), & MUCINEX 1-2Bid... ~  baseline CXR 11/08 showed sl hyperaeration, clear, NAD.Marland Kitchen. spondylitic changes in TSpine noted. ~  PFT 3/09 showed FVC= 3.36 (92%), FEV1= 2.13 (78%), %1sec= 63, mid-flows= 26% pred;  TLC= 6.61 (112%), RV= 3.25 (145%), RV/TLC= 49%;  DLCO= norm... ~  f/u CXR 8/10 showed min bibasilar atx, NAD... ~  PFT 2/11 showed FVC= 2.22 (73%), FEV1= 1.68 (70%), %1sec=76, mid-flows= 57%pred. ~  4/12:  72mo ROV here & she tells me she is getting her care thru the [facility] in [place]; just wants refills today- declines CXR, labs, etc; she hasn't been taking the Advair  due to cost & we discussed regimen w/ ALBUTEROL 2spQid, ATROVENT 2spQid, FLOVENT 220- 2spBid, Singulair 10mg /d, Mucinex 2Bid, fluids, etc... ~  4/13:  Yearly ROV & she remains stable, same meds & followed in [place] as noted... ~  10/13:  Mild exac treated w/ her NEBS Qid, Advair250, Mucinex2Bid, Hycodan cough syrup... ~  7/14: on Allegra/ Flonase, NEBS w/ Albut/Ipratrop Qid, Advair250 (only uses samples due to $$) vs Flovent220-2slBid, Singulair10; ?gets meds in [place]? Asked to add Mucinex-2Bid + fluids. ~  4/15: presented w/ cough, yellow sput, incr SOB; CXR w/ RLL pneum; treated w/ Omnicef, Pred, Mucinex, etc & resolved... ~  5/15: follow up visit w/ resolution of pneumonia; encouraged to use meds regularly: on Allegra/ Flonase, NEBS w/ Albut/Ipratrop prn, Advair250Bid, Singulair10... ~  11/15: f/u visit w/ prominent LPR symptoms, choking, etc; we reviewed antireflux regimen, continue Advair250, NEBS, Singulair10, etc and refer to [facility] for further eval of her LPR/ choking episodes... ~  5/16: presents w/ AB exac and treated w/ Depo80, Pred dosepak, ZPak...  ~  CXR 5/16 showed norm heart size, clear lungs, NAD...  ~  Spirometry 5/16 showed FVC=2.16 (73%), FEV1=1.42 (63%), %1sec=66, mid-flows are reduced at 30% predicted; c/w mod airflow  obstruction and GOLD Stage 2 COPD   FOLLOWED FOR PRIMARY CARE by [place] [facility] >>   HYPERTENSION (ICD-401.9) - controlled on ATENOLOL100, AMLODIPINE10-1/2, HCTZ12.5, KCl 10-2Bid;  BP= 134/76, we do not have notes from her Primary...  CHEST PAIN, ATYPICAL (ICD-786.59) - on ASA 81mg /d... she denies recurrent CP, exertional CP, etc...  PALPITATIONS (ICD-785.1) - hx palpitations in the past... no recent symptoms on meds above & she knows to avoid caffeine, etc...  OVERWEIGHT (ICD-278.02) - we reviewed diet + exercise; weight = 205# w/ BMI= 31-2...  GERD (ICD-530.81) - on ZANTAC 300mg Qhs... she has a hx of GERD and LPR prev controlled on these meds but now symptoms have returned w/ prominent choking episodes & resp exac...   DIVERTICULOSIS OF COLON (ICD-562.10) - on Levsin prn; last colonoscopy 7/04 by DrJEdwards- showed divertics only; she now gets her GI follow up in [place]...  BREAST CANCER >> she noted blood in her bra from nipple disch, initial mammogram in [place] was neg but check up from her primary doctor ([facility] in [place]) lead to a bx from surg at [facility] w/ Dx of Breast Cancer> Surg- DrMcNutt (initial excision followed by 2nd surg for pos margins), XRT- DrBrown, Oncology- DrSwift now on Tamoxifen & doing very well by her report...  ~  See notes in Care Everywhere=> reviewed...  STRESS INCONTINENCE (ICD-788.39) - she claims leakage of urine and stool- she has been recommended to f/u w/ GYN for this eval....  TIA >> on ASA 81mg /d & prev on Plavix as well...  ANXIETY (ICD-300.00) - states that she is under stress due to her condition and having to care for her mother (on hospice w/ renal failure)... Rx for ALPRAZOLAM 0.25mg  Tid Prn written...   Past Surgical History:  Procedure Laterality Date  . ABDOMINAL HYSTERECTOMY    . BREAST LUMPECTOMY  12/17/2012 & 01/14/2013    Outpatient Encounter Prescriptions as of 02/07/2017  Medication Sig  . albuterol (PROVENTIL) (2.5 MG/3ML)  0.083% nebulizer solution Take 3 mLs (2.5 mg total) by nebulization 2 (two) times daily.  Marland Kitchen albuterol (VENTOLIN HFA) 108 (90 Base) MCG/ACT inhaler INHALE ONE TO TWO PUFFS INTO LUNGS EVERY 4 TO 6 HOURS AS NEEDED  . ALPRAZolam (XANAX) 0.25 MG tablet Take  1 tablet (0.25 mg total) by mouth 3 (three) times daily as needed.  Marland Kitchen amLODipine (NORVASC) 10 MG tablet Take 5 mg by mouth daily.   . ARTIFICIAL TEARS ophthalmic solution As needed for dry eyes   . aspirin 325 MG tablet Take 325 mg by mouth daily.  . cholecalciferol (VITAMIN D) 1000 units tablet Take 1,000 Units by mouth daily.  Marland Kitchen dicyclomine (BENTYL) 20 MG tablet Take 1 tablet (20 mg total) by mouth 3 (three) times daily as needed for spasms.  . fexofenadine (ALLEGRA) 180 MG tablet Take 1 tablet (180 mg total) by mouth daily. Once a day as needed for allergies  . fluticasone (FLONASE) 50 MCG/ACT nasal spray USE TWO SPRAYS IN EACH NOSTRIL ONE TIME DAILY  . Fluticasone-Salmeterol (ADVAIR DISKUS) 500-50 MCG/DOSE AEPB Inhale 1 puff into the lungs 2 (two) times daily.  . Fluticasone-Salmeterol (ADVAIR DISKUS) 500-50 MCG/DOSE AEPB Inhale 1 puff into the lungs 2 (two) times daily.  . hydrochlorothiazide (HYDRODIURIL) 12.5 MG tablet Take 12.5 mg by mouth daily.  Marland Kitchen HYDROcodone-homatropine (HYCODAN) 5-1.5 MG/5ML syrup Take 5 mLs by mouth every 6 (six) hours as needed for cough.  . hyoscyamine (LEVSIN) 0.125 MG tablet Take 1 tablet (0.125 mg total) by mouth 3 (three) times daily as needed for cramping.  Marland Kitchen ipratropium (ATROVENT) 0.02 % nebulizer solution INHALE THE CONTENTS OF 1 VIAL VIA NEBULIZER THREE TIMES DAILY  . loratadine (CLARITIN) 10 MG tablet Take 10 mg by mouth daily as needed.  . meclizine (ANTIVERT) 25 MG tablet TAKE ONE TABLET BY MOUTH THREE TIMES DAILY AS NEEDED FOR DIZZINESS OR NAUSEA.  . methylPREDNISolone (MEDROL) 4 MG tablet Take 1 tablet (4 mg total) by mouth every other day.  . metoprolol (LOPRESSOR) 100 MG tablet Take 100 mg by mouth  daily.  . montelukast (SINGULAIR) 10 MG tablet TAKE 1 TABLET AT BEDTIME  . naproxen (NAPROSYN) 500 MG tablet Take 500 mg by mouth 3 (three) times daily with meals.  . pantoprazole (PROTONIX) 40 MG tablet TAKE 1 TABLET DAILY  30 MINUTES PRIOR TO BREAKFAST  . potassium chloride (MICRO-K) 10 MEQ CR capsule TAKE 2 CAPSULES (20 MEQ TOTAL) 2 TIMES DAILY.   . ranitidine (ZANTAC) 300 MG tablet TAKE 1 TABLET AT BEDTIME  . tamoxifen (NOLVADEX) 20 MG tablet Take 20 mg by mouth daily.  . traMADol (ULTRAM) 50 MG tablet Take 1 tablet (50 mg total) by mouth 3 (three) times daily as needed.  . traZODone (DESYREL) 50 MG tablet Take 50 mg by mouth at bedtime. For sleep   . vitamin B-12 (CYANOCOBALAMIN) 1000 MCG tablet Take 1,000 mcg by mouth daily.  . [DISCONTINUED] methylPREDNISolone (MEDROL) 4 MG tablet Take 1 tablet (4 mg total) by mouth daily.  . [DISCONTINUED] aspirin (BAYER LOW STRENGTH) 81 MG EC tablet Take 81 mg by mouth daily.    . [DISCONTINUED] Fluticasone-Salmeterol (ADVAIR DISKUS) 500-50 MCG/DOSE AEPB Inhale 1 puff into the lungs 2 (two) times daily. (Patient not taking: Reported on 02/07/2017)   No facility-administered encounter medications on file as of 02/07/2017.      No Known Allergies    Immunization History  Administered Date(s) Administered  . Influenza Split 08/14/2011, 07/11/2012, 07/16/2013, 07/24/2014  . Influenza, High Dose Seasonal PF 07/30/2015, 07/18/2016  . Influenza-Unspecified 06/30/2015  . Pneumococcal Conjugate-13 01/12/2011  . Pneumococcal Polysaccharide-23 07/16/2013    Current Medications, Allergies, Past Medical History, Past Surgical History, Family History, and Social History were reviewed in [employer] record.   Review of Systems  See HPI - all other systems neg except as noted... The patient complains of dyspnea on exertion, incontinence, and difficulty walking.  The patient denies anorexia, fever, weight loss, weight gain,  vision loss, decreased hearing, hoarseness, chest pain, syncope, peripheral edema, prolonged cough, headaches, hemoptysis, abdominal pain, melena, hematochezia, severe indigestion/heartburn, hematuria, muscle weakness, suspicious skin lesions, transient blindness, depression, unusual weight change, abnormal bleeding, enlarged lymph nodes, and angioedema.     Objective:   Physical Exam     WD, Overweight, 71 y/o BF in NAD... GENERAL:  Alert & oriented; pleasant & cooperative...  HEENT:  [place]/AT, EOM-wnl, PERRLA, EACs-clear, TMs-wnl, NOSE-clear, THROAT-clear & wnl. NECK:  Supple w/ fairROM; no JVD; normal carotid impulses w/o bruits; no thyromegaly or nodules palpated; no lymphadenopathy. CHEST:  decr BS bilat but clear- no wheezing/ rales/ rhonchi/ or signs of consolidation... HEART:  Regular Rhythm; without murmurs/ rubs/ or gallops detected... ABDOMEN:  Obese, sl distended, decr BS, & tender epig; no organomegaly or masses palpated... EXT: without deformities, mild arthritic changes; no varicose veins/ +venous insuffic/ no edema. NEURO:  CN's intact;  no focal neuro deficits... DERM:  No lesions noted; no rash etc...  RADIOLOGY DATA:  Reviewed in the EPIC EMR & discussed w/ the patient...  LABORATORY DATA:  Reviewed in the EPIC EMR & discussed w/ the patient...   ASSESSMENT & PLAN:    AR & ASTHMA>  Her chronic obstructive asthma has been better regulated w/ meds from the [place] [facility] w/ their samples etc; compliance has been an issue in the past; now supposed to be on Advair500Bid, NEBS w/ Albut & Ipratrop Tid, Singulair10, Hycodan prn; she has required occas rounds of antibiotics and Pred in the past so we will remind her to use the NEBULIZER w/ both meds Tid regularly + the Advair500Bid...  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  ADDENDUM>> with her norm IgE/RAST, and Eos~300 she would be a candidate for [facility] & we will consider this in follow up... 11/29/16>   The FeNO result is very surprising as she has been on the MEDROL 4mg  daily for ~37mo now AND she feels much better c/w a steroid benefit to her airways dis; we reviewed her prev lab results w/ norm IgE & RAST, and CBC w/ 6.6% eos before the Medrol- she is a candidate for Nucala/ Fasenra but declines the biologic & prefers to continue Medrol slow taper given her clinical response; in this regard we will decr her Medrol4mg  to 1 tab alternate w/ 1/2 tab for Feb & cut further to 4mg  Qod starting in March w/ ROV recheck in 62mo. 02/07/17>   We decided to refill her Medrol 4mg  one tab QOD & encourage compliance w/ Advair500Bid, NEBS w/ albut/ipratrop, Singulair10, etc... We will plan rov recheck in 26mo...   GI> Hx GERD, Divertics> more abd findings than chest findings today- hx diarrhea, distended, decr BS, epig tender; we will add PPI- Protonix40, continue Zantac300, add Align to her Activia 7 plan f/u appt w/ [place]- [facility] [place]...  << Medical problems followed by her primary care team & specialists at  [facility] >>  HBP>   AtypCP & Palpit>  Breast Cancer>   Stress Incont>  TIA>   Anxiety>  She needs to take the Alpraz0.25 tid as discussed...   Patient's Medications  New Prescriptions   FLUTICASONE-SALMETEROL (ADVAIR DISKUS) 500-50 MCG/DOSE AEPB    Inhale 1 puff into the lungs 2 (two) times daily.  Previous Medications   ALBUTEROL (PROVENTIL) (2.5 MG/3ML) 0.083% NEBULIZER SOLUTION    Take 3 mLs (2.5 mg total) by nebulization 2 (two) times daily.   ALBUTEROL (VENTOLIN HFA) 108 (90 BASE) MCG/ACT INHALER    INHALE ONE TO TWO PUFFS INTO LUNGS EVERY 4 TO 6 HOURS AS NEEDED   ALPRAZOLAM (XANAX) 0.25  MG TABLET    Take 1 tablet (0.25 mg total) by mouth 3 (three) times daily as needed.   AMLODIPINE (NORVASC) 10 MG TABLET    Take 5 mg by mouth daily.    ARTIFICIAL TEARS OPHTHALMIC SOLUTION    As needed for dry eyes    ASPIRIN 325 MG TABLET    Take 325 mg by mouth daily.   CHOLECALCIFEROL (VITAMIN D) 1000 UNITS TABLET    Take 1,000 Units by mouth daily.   DICYCLOMINE (BENTYL) 20 MG TABLET    Take 1 tablet (20 mg total) by mouth 3 (three) times daily as needed for spasms.   FEXOFENADINE (ALLEGRA) 180 MG TABLET    Take 1 tablet (180 mg total) by mouth daily. Once a day as needed for allergies   FLUTICASONE (FLONASE) 50 MCG/ACT NASAL SPRAY    USE TWO SPRAYS IN EACH NOSTRIL ONE TIME DAILY   FLUTICASONE-SALMETEROL (ADVAIR DISKUS) 500-50 MCG/DOSE AEPB    Inhale 1 puff into the lungs 2 (two) times daily.   HYDROCHLOROTHIAZIDE (HYDRODIURIL) 12.5 MG TABLET    Take 12.5 mg by mouth daily.   HYDROCODONE-HOMATROPINE (HYCODAN) 5-1.5 MG/5ML SYRUP    Take 5 mLs by mouth every 6 (six) hours as needed for cough.   HYOSCYAMINE (LEVSIN) 0.125 MG TABLET    Take 1 tablet (0.125 mg total) by mouth 3 (three) times daily as needed for cramping.   IPRATROPIUM (ATROVENT) 0.02 % NEBULIZER SOLUTION    INHALE THE CONTENTS OF 1 VIAL VIA NEBULIZER THREE TIMES DAILY   LORATADINE (CLARITIN) 10 MG TABLET    Take 10 mg by mouth daily as needed.   MECLIZINE (ANTIVERT) 25 MG TABLET    TAKE ONE TABLET BY MOUTH THREE TIMES DAILY AS NEEDED FOR DIZZINESS OR NAUSEA.   METOPROLOL (LOPRESSOR) 100 MG TABLET    Take 100 mg by mouth daily.   MONTELUKAST (SINGULAIR) 10 MG TABLET    TAKE 1 TABLET AT BEDTIME   NAPROXEN (NAPROSYN) 500 MG TABLET    Take 500 mg by mouth 3 (three) times daily with meals.   PANTOPRAZOLE (PROTONIX) 40 MG TABLET    TAKE 1 TABLET DAILY  30 MINUTES PRIOR TO BREAKFAST   POTASSIUM CHLORIDE (MICRO-K) 10 MEQ CR CAPSULE    TAKE 2 CAPSULES (20 MEQ TOTAL) 2 TIMES DAILY.    RANITIDINE (ZANTAC) 300 MG TABLET    TAKE 1 TABLET AT  BEDTIME   TAMOXIFEN (NOLVADEX) 20 MG TABLET    Take 20 mg by mouth daily.   TRAMADOL (ULTRAM) 50 MG TABLET    Take 1 tablet (50 mg total) by mouth 3 (three) times daily as needed.   TRAZODONE (DESYREL) 50 MG TABLET    Take 50 mg by mouth at bedtime. For sleep    VITAMIN B-12 (CYANOCOBALAMIN) 1000  MCG TABLET    Take 1,000 mcg by mouth daily.  Modified Medications   Modified Medication Previous Medication   METHYLPREDNISOLONE (MEDROL) 4 MG TABLET methylPREDNISolone (MEDROL) 4 MG tablet      Take 1 tablet (4 mg total) by mouth every other day.    Take 1 tablet (4 mg total) by mouth daily.  Discontinued Medications   ASPIRIN (BAYER LOW STRENGTH) 81 MG EC TABLET    Take 81 mg by mouth daily.     FLUTICASONE-SALMETEROL (ADVAIR DISKUS) 500-50 MCG/DOSE AEPB    Inhale 1 puff into the lungs 2 (two) times daily.

## 2017-02-07 NOTE — Patient Instructions (Signed)
Today we updated your med list in our EPIC system...    Continue your current medications the same...  Today we did an ambulatory oximetry test to see if you meet Medicare's rules for Oxygen therapy...    The good news is that your oxygenation is good at rest and with walking- your saturations stayed in the 90's...    The bad news is that Medicare will not pay for oxygen until your O2 saturations drop below 88%  We decided to keep you on the low dose MEDROL for now-- 4mg  tabs one tab every other day...  Call for any questions..  Let's plan a follow up visit in 67mo, sooner if needed for problems.Marland KitchenMarland Kitchen

## 2017-02-09 ENCOUNTER — Other Ambulatory Visit: Payer: Self-pay | Admitting: Pulmonary Disease

## 2017-04-05 ENCOUNTER — Other Ambulatory Visit: Payer: Self-pay | Admitting: Pulmonary Disease

## 2017-05-09 ENCOUNTER — Ambulatory Visit (INDEPENDENT_AMBULATORY_CARE_PROVIDER_SITE_OTHER): Payer: Medicare HMO | Admitting: Pulmonary Disease

## 2017-05-09 ENCOUNTER — Encounter: Payer: Self-pay | Admitting: Pulmonary Disease

## 2017-05-09 VITALS — BP 142/82 | HR 84 | Temp 98.3°F | Ht 68.0 in | Wt 199.4 lb

## 2017-05-09 DIAGNOSIS — K219 Gastro-esophageal reflux disease without esophagitis: Secondary | ICD-10-CM | POA: Diagnosis not present

## 2017-05-09 DIAGNOSIS — J301 Allergic rhinitis due to pollen: Secondary | ICD-10-CM | POA: Diagnosis not present

## 2017-05-09 DIAGNOSIS — J449 Chronic obstructive pulmonary disease, unspecified: Secondary | ICD-10-CM

## 2017-05-09 MED ORDER — FLUTICASONE FUROATE-VILANTEROL 200-25 MCG/INH IN AEPB: 1.0000 | INHALATION_SPRAY | Freq: Every day | RESPIRATORY_TRACT | 0 refills | Status: DC

## 2017-05-09 NOTE — Patient Instructions (Addendum)
Today we updated your med list in our EPIC system...    Continue your current medications the same...    We refilled the meds you requested & provided some ADVAIR samples...  Remember for follow the anti-reflux regimen>>    Take PROTONIX40 about 30 min before the evening meal...    Do not eat or drink after dinner in the eve...    Elevate the head of your bed on 6" blocks...  Stay as active as possible & the physial therapy should help...  Call for any questions...  Let's plan a follow up visit in 73mo, sooner if needed for problems.Marland KitchenMarland Kitchen

## 2017-05-09 NOTE — Progress Notes (Signed)
Subjective:    Patient ID: Rachael Peck, female    DOB: 01-06-1946, 71 y.o.   MRN: [id number]  HPI 71 y/o BF here for a follow up visit... she has multiple medical problems including:  AR;  Asthma;  HBP;  Hx atypCP & palpit;  Overweight;  GERD;  Divertics;  Stress incontinence;  Anxiety... She lives in [place] and she tells me she is followed for general medical purposes in the [facility] & was referred to [place] for cardiac eval- we do not have any of these records- she denies recurrent CP, palpit, etc...  ~  SEE PREV EPIC NOTES FOR OLDER DATA >>    Only labs/ XRays here were in 2010 as she assures me that XRays, EKGs, LABS all done at [facility] (we have requested records)...  CXR 5/15 showed improved RLL & no evid of pneumonia seen, sl peribronch thickening, otherw neg/ NAD...  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   LABS are all done at [facility]  ~  August 21, 2014:  46mo ROV and Rachael Peck has been doing reasonably well- but notes recent resp exac w/ choking spells at night assoc w/ cough, chest tightness, some wheezing, "hard to breathe", etc;  I am very suspicious for LPR, reflux related resp exac, & she notes "choking more";  She has been on diet (weight unchanged), taking Advair250Bid, NEBS w/ Duoneb Bid, Singulair10, and using Hycodan/ Tessalon prn;  She also takes Media regularly;  For the chronic GERD/ reflux she is on ZANTAC300 Qhs, and Levsin0.125mg  prn...  She [place] her regular medical f[place]u [place] and specialty f/u at [facility]...  Exam shows clear chest, sl decr BS bilat but no rales, rhonchi, or signs of consolidation... Last CXR was 5/15 showing norm heart size, clear lungs x mild incr markings at bases, NAD, mod thoracic spondylosis...     We reviewed prob list, meds, xrays; note- labs all done by her PrimaryCare in [place]>>  PLAN>>  We discussed need for further GI eval of her LPR,  choking episodes, etc because they are clearly related to her AB exac, coughing, etc;  Rec referral to [place] (she has seen them in the past) for further eval- ?Ba swallow, EGD, etc;  In the meanwhile continue Rx w/ Advair250, NEBS w/ Duoneb, Singulair, and vigorous antireflux regimen...   ~  Mar 10, 2015:  64mo ROV & f/u Asthma w/ component of fixed obstructive dis> at time of her last OV we referred her to [place] (as above) but she never went;  Presents today c/o 3d hx cough, sm amt clear white sput, no hemoptysis, mild SOB & chest discomfort, denies f/c/s and she is vague/ not sure what brought this on/ etc... We reviewed the following medical problems during today's office visit >>     AR/ Asthma> on Allegra/ Flonase, NEBS w/ Albut/Ipratrop prn, Advair250Bid, Singulair10; ?[place] meds [place]? Asked to add Mucinex-2Bid + fluids; 5/16 presents w/ ABexac=> Rx w/ Depo80, dosepak, ZPak...     HBP> on Aten100, Amlod10-1/2, HCT12.5 & K10-2Bid; BP= 134/76 & she denies CP, palpit, ch in SOB, edema...    Overweight> "Im watching my diet" and weight = 205# (no real change) & BMI=31; we reviewed diet & exercise strategies...    GI> GERD, Divertics> on Zantac300Qhs, & Levsin prn; denies abd pain, n/v, c/d, blood seen; she is due for a follow up  colonoscopy- encouraged to set this up thru her contacts at [facility]...    GU> Stress Incont> prev on Oxybut but off now; she states voiding is better & leaking less, doesn't want meds...    BREAST CANCER> Dx and treated at [facility] in 2014> Surgery, XRT, Oncology- on Tamoxifen now... NO OLD RECORDS AVAIL IN CareEverywhere?    TIA> on ASA81; followed at [facility], she denies cerebral ischemic symptoms...    Anxiety> on Trazodone50Qhs, Alprazolam0.25mg  prn; she is generally stable... We reviewed prob list, meds, xrays and labs> see below for updates >>   CXR 5/16 showed norm heart size, clear lungs, NAD...   Spirometry 5/16 showed FVC=2.16 (73%), FEV1=1.42 (63%), %1sec=66,  mid-flows are reduced at 30% predicted; c/w mod airflow obstruction and GOLD Stage 2 COPD...     IMP/PLAN>>  Rachael Peck appears to have an AB exac, and an element of fixed obstruction due to her poorly controlled asthma over the yrs; Rec to treat w/ Depo80, Pred dosepak, & ZPak at this time; she will continue to use her NEBULIZER w/ Duoneb Tid, NOIBBC488 Bid, Singulair10, etc...   ~  August 10, 2015:  53mo ROV & add-on appt requested by pt> presents c/o feeling bad over the past month, assoc w/ incr cough & mucus; she called & we phoned in Pred which helped; now c/o an "attack" today w/ incr cough, clear phlegm, no hemoptysis, and incr SOB although she is quite vague & unsure of her symptoms- says she had some tightness but "just a little wheezing"; on exam she is in no distress and chest is clear w/o wheezing/ rales/ rhonchi; her abd however is sl distended w/ +epig tenderness on palpation assoc w/ decr BS; she notes recent diarrhea & taking Activia, notes some left sided abd discomfort but denies reflux, dysphagia, n/v, blood seen, etc...     AR/ Asthma> on Allegra/ Flonase, NEBS w/ Albut/Ipratrop Bid-Tid, Advair250Bid, Singulair10; ?[place] meds [place]? Prev asked to add Mucinex-2Bid + fluids; presents 10/16 w/ incr SOB but chest is clear & just finished Pred called in for her; we decided to incr to ADVAIR500-Bid + use the NEBS Tid regularly...    HBP> on Aten100, Amlod10-1/2, HCT12.5 & K10-2Bid; BP= 140/72 & she denies CP, palpit, edema...    Overweight> "Im watching my diet" and weight down 6# to 199# & BMI=30; we reviewed diet & exercise strategies...    GI> GERD, Divertics> on Zantac300Qhs, & Levsin prn; she has epig tender, distention, decr BS; she is due for a follow up colonoscopy- we will refer to her GI in [place] [facility]Conway...    GU> Stress Incont> prev on Oxybut but off now; she states voiding is better & leaking less, doesn't want meds...    BREAST CANCER> Dx and treated at [facility] in 2014> Surgery,  XRT, Oncology- on Tamoxifen now... NO OLD RECORDS AVAIL IN CareEverywhere?    TIA> on ASA81; followed at [facility], she denies cerebral ischemic symptoms...    Anxiety> on Trazodone50Qhs, Alprazolam0.25mg  prn; she is quite stressed & asked to take the alpraz0.25 tid to help...  We reviewed prob list, meds, xrays and labs> see below for updates >>     PLAN>>  We reviewed her prev CXR & spirometry- several interval calls w/ Pred called in for her; currently c/o "attack" today but exam is clear w/o tightness or wheezing; we decided to incr the ADVAIR to the 500 inhaler- still one inhalation Bid & she is encouraged to take the Alpraz0.25 tid to help  w/ her nerves;  She alos has on-going GI issues- c/o diarrhea but exam is distended, sl tender, decr BS- encouraged to follow antireflux regimen, add PROTONIX40 Qam, take the Zantac Qhs, add Align to the Activia, and f/u w/ her GI=DrConway in [place]...  ~  March 29, 2016:  7-95mo ROV & Rachael Peck returns for a pulmonary follow up visit>  She tells me that she has recently been house-bound due to the springtime/ pollen/ trees and notes coughing and "resp attacks";  She has been taking Advair500Bid, Singulair10, NEBS w/ Albut & Ipratropium Bid, VentolinHFA (using it 3-4 times daily), plus her Allegra180 & Flonase=> and she notes very joyously that "all this has brought me back to life" but she is requesting a regular cough medication (we discussed trial TRAMADOL 50mg  Tid prn);  She has not required any ER visits/ hospitalizations/ nor has she called here or [facility] for additional meds/ steroids/ etc... 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     12/30/15> she was seen in the [facility] clinic by DrAvery & notes to be doing well on her Tamoxifen, few hot flashes, note reviewed- exam was OK, Labs OK, Mammogram OK, & they plan ROV 20mo...    11/26/15> she was seen in the GI clinic by [facility], GERD, LPR; on Protonix40, Zantac300, +lifestyle modifications and she was much improved; they plan recheck in 83mo & try to get her to reduce the PPI dose... EXAM shows Afeb, VSS, O2sat=100% on RA;  HEENT- neg, mallampati2;  Chest- clerar w/o w/r/r;  Heart- RR w/o m/r/g;  Abd- soft, nontender, neg;  Ext- neg w/o c/c/e;  Neuro- intact...   LABS 12/30/15 at [facility]>  Chems- wnl;  CBC- wnl w/ Hg=12.4, MCV=86, WBC=3.9  Mammogram 02/04/16 at [facility]>  Benign, scat fibroglandular densities, no suspicious mass or calcif seen, s/p lumpectomy on left  CXR 03/29/16>  Norm heart size, clear lungs- NAD, post op changes in left breast area... IMP/PLAN>>  Despite her intermittent symptoms- Rachael Peck appears stable from the pulmonary standpoint & hasn't required ER visits, hospitalizations, office phone calls for additional meds, etc; she is pleased w/ her current med regimen & just wants to continue this the same=> meds refilled today & new Rx for TRAMADOL50mg Tid to try for her cough... We plan ROV in 41mo, sooner if needed for acute problems...   ~  October 04, 2016:  33mo ROV and pulmonary follow up visit>  Rachael Peck continues her medical follow up in [place] but states she has several issues se wishes to discuss w/ me- she is still on Aten100 but is being switched to Metoprolol when out;  She has noted an incr cough for the last 61mo, sore in her sides from coughing, chr stable SOB/DOE "not bad" she says, no f/c/s- she  wants a cough syrup...    Interval records from [place] reviewed in epic & summarized  here=> she has a long list of providers!    Medical f/u by PA-Cooper in [facility] 08/31/16> COPD, HBP, DM, OA- doing well, active, no issues;  FLP 11/17 showed TChol 143, TG 366, HDL 48, LDL 43 on diet alone...     She had f/u GI- DrConway 07/21/16> GERD- on Protonix40 & Zantac150Bid, pt was reluctant to decr meds & they decided to continue same...    She was seen by Heme-[facility] NP-[place] 06/29/16> left breast cancer dx 12/2012 (DCIS- ER/PR pos)& s/p left lumpectomy3/3/14, re-excision of pos margins 01/14/13, aqdjuvant XRT completed 03/25/13, Tamoxifen started 03/2013- c/o hot flashes... EXAM shows Afeb, VSS, O2sat=97% on RA;  HEENT- neg, mallampati2;  Chest- clear w/o w/r/r;  Heart- RR w/o m/r/g;  Abd- soft, nontender, neg;  Ext- neg w/o c/c/e;  Neuro- intact...   CXR 10/04/16>  SHE DID NOT GO TO XRAY for this film; Last CXR 03/29/16 reviewed by me in PACS- norm heart size, clear lungs, post op changes in left breast area, degen changes in Tspine...  Spirometry 10/04/16>  FVC=2.17 (75%), FEV1=1.44 (64%), %1sec=66, mid-flows reduced at 37% predicted   This is c/w a mod obstructive ventilatory defect...  FeNO 10/04/16>  FeNO = 125 => we decided to continue Advair500 & add Medrol4mg /d  LABS 10/04/16>  CBC- Hg=11.9, wbc=4.1, eos=6.6% (therefore eos~300);  IgE level=16;  RAST tests= all NEG IMP/PLAN>>  Rachael Peck has mild to mod COPD based on her long hx asthma w/ fixed airway obstruction; I am surprised that her FeNO is that high=> we are evaluating further w/ CBC/ diff to check Eos & and IgE/ RAST to see if a biological would be appropriate for her; in the meanwhile we will add MEDROL 4mg  Qam (low dose due to her IFG/DM diagnosis); we plan ROV recheck in 4wks...  ADDENDUM>> with her norm IgE/RAST, and Eos~300 she would be a candidate for [facility] Rx & we will consider this in follow up...  ~  November 29, 2016:  36mo ROV &  Rachael Peck notes tha "EVERYTHING IS BETTER" stating "I can breathe" and "I can talk";  She is getting out & about now & doing OK- more mobile etc;  On Medrol4mg /d & tol well, Advair500Bid, Singulair10, NEBS w/ albut & Ipratop Tid, + prn Hycodan/ Allegra/ Flonase;  We discussed her LABS w/ incr eos at 6-7% and normal IgE/ neg RAST => she's a candidate for biologic like Nucala/ Cinqair/ Berna Bue but she declines due to expense & the fact that she is improved on the Pred...     Prob List reviewed...    EXAM shows Afeb, VSS, O2sat=97% on RA;  HEENT- neg, mallampati2;  Chest- clear w/o w/r/r;  Heart- RR w/o m/r/g;  Abd- soft, nontender, neg;  Ext- neg w/o c/c/e;  Neuro- intact...   LABS 11/29/16>  FeNO = 151 IMP/PLAN>>  The FeNO result is very surprising as she has been on the MEDROL 4mg  daily for ~41mo now AND she feels much better c/w a steroid benefit to her airways dis; we reviewed her prev lab results w/ norm IgE & RAST, and CBC w/ 6.6% eos before the Medrol- she is a candidate for Nucala/ Fasenra but declines the biologic & prefers to continue Medrol slow taper given her clinical response; in this regard we will decr her Medrol4mg  to 1 tab alternate w/ 1/2 tab for Feb & cut further to 4mg  Qod starting in March w/ ROV recheck in 8mo...   ~  February 07, 2017:  79mo ROV &  pulmonary follow up visit... Rachael Peck states that her breathing was better & she was actively doing her regimen but then developed "siatica" & couldn't walk- PCP rx w/ Naprosyn & PT;  She also ran out of her Medrol (was on 4mg  qod) ~2wks ago & since then she notes that pollen is bothering her w/ several attacks, using her rescue inhaler more often, & she notes that she is in the "donut hole for Advair ($1000 per month she says)... In addition she says that her sister has a travel company & she is going to travel w/ her & she would like to have oxygen to take "just in case";  I explained Medicare's requirements for home O2 as she cannot self-pay & offered  ambulatory oximetry to see if she qualifies for O2...  We reviewed the following medical problems during today's office visit >>     AR/ Asthma> on Medrol- weaned to 4mg Qod, Allegra/ Flonase, NEBS w/ Albut/Ipratrop Bid-Tid, Advair500Bid, Singulair10; ?gets meds in [place]? Prev asked to add Mucinex-2Bid + fluids; she freq runs out, compliance is poor...    HBP> on Metop100, Amlod10-1/2, HCT12.5 & K10-2Bid; BP= 130/76 & she denies CP, palpit, edema...    Overweight> "Im watching my diet" and weight stable at 200# & BMI=30; we reviewed diet & exercise strategies...    GI> GERD, Divertics> on Protonix40 + Zantac300Qhs, & Bentyl prn; she has epig tender, distention, decr BS; she is followed by GI in [place] & we do not have records...    GU> Stress Incont> prev on Oxybut but off now; she states voiding is better & leaking less, doesn't want meds...    BREAST CANCER> Dx and treated at [facility] in 2014> Surgery, XRT, Oncology- on Tamoxifen now... NO OLD RECORDS AVAIL IN CareEverywhere?    TIA> on ASA81; followed at [facility], she denies cerebral ischemic symptoms...    Anxiety> on Trazodone50Qhs, Alprazolam0.25mg  prn; she is quite stressed & asked to take the Alpraz0.25 tid to help...  EXAM shows Afeb, VSS, O2sat=97% on RA;  HEENT- neg, mallampati2;  Chest- clear w/o w/r/r;  Heart- RR w/o m/r/g;  Abd- soft, nontender, neg;  Ext- neg w/o c/c/e;  Neuro- intact...   Ambulatory Oximetry 02/07/17>  O2sat=95% on RA at rest w/ pulse=96/min;  She ambulated just 2 Laps (185'ea) & stopped due to fatigue & pain; lowest O2sat=96% w/ pulse=108/min IMP/PLAN>>  We decided to refill her Medrol 4mg  one tab QOD & encourage compliance w/ Advair500Bid, NEBS w/ albut/ipratrop, Singulair10, etc... We will plan rov recheck in 25mo...   ~  May 09, 2017:  32mo ROV & pulmonary recheck> her PCP is the [facility] in [place]...  Followed for Asthma w/ COPD on Medrol 4mg  Qod, NEBS w/ Albut+Iprtrop Bid-Tid, Advair500Bid, Singulair10,  Antihist+Flonase, ProairHFA as needed...  She has never smoked but her asthma was only intermittently controlled due to financial constraints...  Prev CXR shows norm heart size, postop changes in left breast, clear lungs & no adenopathy, degen changes in Tspine...  Prev PFTs showed mod airflow obstruction w/ FEV1=1.44 (64%) c/w GOLD Stage2 COPD...  She has not been hypoxemic...  Prev IgE=16 and RAST panel was NEG; CBC w/ 4-6% eos (100-300 absolute)... She continues to do well on this regimen, no interim exac & notes min cough, clear spu, no hemoptysis, she still needs to incr activity... Care Everywhere records reviewed>    She saw ONC-DrAvery 02/01/17>  Left breast cancer- s/p lumpectomy 3/14, re-excision of pos margins 3/14, XRT fin 6/14,  on Tamoxifen since 6/14; doing well w/ LABS- Hg=11.9, WBC=3.5, , BS=130...    She was seen by Oncology-SHauser,NP 02/23/17> Breast Ca survivorship visit, note reviewed, s/p surg, XRT, on Tamoxifen; f/u mammogram was neg;     Seen in [place] 03/15/17>  COPD, HBP, DM2, OA, hx breast cancer; doing satis- A1c=6.2    Getting phys therapy in [place] for lumbosacral radiculopathy, notes reviewed- goals achieved & they stopped rx 03/22/17... EXAM shows Afeb, VSS, O2sat=99% on RA;  HEENT- neg, mallampati2;  Chest- clear w/o w/r/r;  Heart- RR w/o m/r/g;  Abd- soft, nontender, neg;  Ext- neg w/o c/c/e;  Neuro- intact...  IMP/PLAN>>  Rachael Peck is stable, we discussed continuing the same meds for now & call for any interval problems...           Problem List:    ALLERGIC RHINITIS (ICD-477.9) - on ALLEGRA 180mg /d, & FLONASE Qhs...   CHRONIC OBSTRUCTIVE ASTHMA UNSPECIFIED (ICD-493.20) - on ADVAIR 250Bid (but not compliant),  PROAIR HFA Prn (using 2-3 times daily), ATROVENT 2spQid (started by the [facility] clinic in [place]), & MUCINEX 1-2Bid... ~  baseline CXR 11/08 showed sl hyperaeration, clear, NAD.Marland Kitchen. spondylitic changes in TSpine noted. ~  PFT 3/09 showed FVC= 3.36  (92%), FEV1= 2.13 (78%), %1sec= 63, mid-flows= 26% pred;  TLC= 6.61 (112%), RV= 3.25 (145%), RV/TLC= 49%;  DLCO= norm... ~  f/u CXR 8/10 showed min bibasilar atx, NAD... ~  PFT 2/11 showed FVC= 2.22 (73%), FEV1= 1.68 (70%), %1sec=76, mid-flows= 57%pred. ~  4/12:  18mo ROV here & she tells me she is getting her care thru the [facility] in [place]; just wants refills today- declines CXR, labs, etc; she hasn't been taking the Advair due to cost & we discussed regimen w/ ALBUTEROL 2spQid, ATROVENT 2spQid, FLOVENT 220- 2spBid, Singulair 10mg /d, Mucinex 2Bid, fluids, etc... ~  4/13:  Yearly ROV & she remains stable, same meds & followed in [place] as noted... ~  10/13:  Mild exac treated w/ her NEBS Qid, Advair250, Mucinex2Bid, Hycodan cough syrup... ~  7/14: on Allegra/ Flonase, NEBS w/ Albut/Ipratrop Qid, Advair250 (only uses samples due to $$) vs Flovent220-2slBid, Singulair10; ?gets meds [place]? Asked to add Mucinex-2Bid + fluids. ~  4/15: presented w/ cough, yellow sput, incr SOB; CXR w/ RLL pneum; treated w/ Omnicef, Pred, Mucinex, etc & resolved... ~  5/15: follow up visit w/ resolution of pneumonia; encouraged to use meds regularly: on Allegra/ Flonase, NEBS w/ Albut/Ipratrop prn, Advair250Bid, Singulair10... ~  11/15: f/u visit w/ prominent LPR symptoms, choking, etc; we reviewed antireflux regimen, continue Advair250, NEBS, Singulair10, etc and refer to [facility] for further eval of her LPR/ choking episodes... ~  5/16: presents w/ AB exac and treated w/ Depo80, Pred dosepak, ZPak...  ~  CXR 5/16 showed norm heart size, clear lungs, NAD...  ~  Spirometry 5/16 showed FVC=2.16 (73%), FEV1=1.42 (63%), %1sec=66, mid-flows are reduced at 30% predicted; c/w mod airflow obstruction and GOLD Stage 2 COPD   FOLLOWED FOR PRIMARY CARE by [place] [facility] >>   HYPERTENSION (ICD-401.9) - controlled on ATENOLOL100, AMLODIPINE10-1/2, HCTZ12.5, KCl 10-2Bid;  BP= 134/76, we do not have notes from her  Primary...  CHEST PAIN, ATYPICAL (ICD-786.59) - on ASA 81mg /d... she denies recurrent CP, exertional CP, etc...  PALPITATIONS (ICD-785.1) - hx palpitations in the past... no recent symptoms on meds above & she knows to avoid caffeine, etc...  OVERWEIGHT (ICD-278.02) - we reviewed diet + exercise; weight = 205# w/ BMI=  31-2.Marland KitchenMarland Kitchen  GERD (ICD-530.81) - on ZANTAC 300mg Qhs... she has a hx of GERD and LPR prev controlled on these meds but now symptoms have returned w/ prominent choking episodes & resp exac...   DIVERTICULOSIS OF COLON (ICD-562.10) - on Levsin prn; last colonoscopy 7/04 by DrJEdwards- showed divertics only; she now gets her GI follow up in [place]...  BREAST CANCER >> she noted blood in her bra from nipple disch, initial mammogram in [place] was neg but check up from her primary doctor ([facility] in [place]) lead to a bx from surg at [facility] w/ Dx of Breast Cancer> Surg- DrMcNutt (initial excision followed by 2nd surg for pos margins), XRT- DrBrown, Oncology- DrSwift now on Tamoxifen & doing very well by her report...  ~  See notes in Care Everywhere=> reviewed...  STRESS INCONTINENCE (ICD-788.39) - she claims leakage of urine and stool- she has been recommended to f/u w/ GYN for this eval....  TIA >> on ASA 81mg /d & prev on Plavix as well...  ANXIETY (ICD-300.00) - states that she is under stress due to her condition and having to care for her mother (on hospice w/ renal failure)... Rx for ALPRAZOLAM 0.25mg  Tid Prn written...   Past Surgical History:  Procedure Laterality Date  . ABDOMINAL HYSTERECTOMY    . BREAST LUMPECTOMY  12/17/2012 & 01/14/2013    Outpatient Encounter Prescriptions as of 05/09/2017  Medication Sig  . albuterol (PROVENTIL) (2.5 MG/3ML) 0.083% nebulizer solution Take 3 mLs (2.5 mg total) by nebulization 2 (two) times daily.  Marland Kitchen ALPRAZolam (XANAX) 0.25 MG tablet Take 1 tablet (0.25 mg total) by mouth 3 (three) times daily as needed.  Marland Kitchen amLODipine (NORVASC)  10 MG tablet Take 5 mg by mouth daily.   . ARTIFICIAL TEARS ophthalmic solution As needed for dry eyes   . aspirin 325 MG tablet Take 325 mg by mouth daily.  . cholecalciferol (VITAMIN D) 1000 units tablet Take 1,000 Units by mouth daily.  Marland Kitchen dicyclomine (BENTYL) 20 MG tablet TAKE 1 TABLET THREE TIMES DAILY AS NEEDED FOR SPASMS.  . fexofenadine (ALLEGRA) 180 MG tablet Take 1 tablet (180 mg total) by mouth daily. Once a day as needed for allergies  . fluticasone (FLONASE) 50 MCG/ACT nasal spray USE TWO SPRAYS IN EACH NOSTRIL ONE TIME DAILY  . Fluticasone-Salmeterol (ADVAIR DISKUS) 500-50 MCG/DOSE AEPB Inhale 1 puff into the lungs 2 (two) times daily.  . hydrochlorothiazide (HYDRODIURIL) 12.5 MG tablet Take 12.5 mg by mouth daily.  Marland Kitchen HYDROcodone-homatropine (HYCODAN) 5-1.5 MG/5ML syrup Take 5 mLs by mouth every 6 (six) hours as needed for cough.  . hyoscyamine (LEVSIN) 0.125 MG tablet Take 1 tablet (0.125 mg total) by mouth 3 (three) times daily as needed for cramping.  Marland Kitchen ipratropium (ATROVENT) 0.02 % nebulizer solution INHALE THE CONTENTS OF 1 VIAL VIA NEBULIZER THREE TIMES DAILY  . loratadine (CLARITIN) 10 MG tablet Take 10 mg by mouth daily as needed.  . meclizine (ANTIVERT) 25 MG tablet TAKE ONE TABLET BY MOUTH THREE TIMES DAILY AS NEEDED FOR DIZZINESS OR NAUSEA.  . methylPREDNISolone (MEDROL) 4 MG tablet Take 1 tablet (4 mg total) by mouth every other day.  . metoprolol (LOPRESSOR) 100 MG tablet Take 100 mg by mouth daily.  . montelukast (SINGULAIR) 10 MG tablet TAKE 1 TABLET AT BEDTIME  . naproxen (NAPROSYN) 500 MG tablet Take 500 mg by mouth 3 (three) times daily with meals.  . pantoprazole (PROTONIX) 40 MG tablet TAKE 1 TABLET DAILY  30 MINUTES PRIOR TO  BREAKFAST  . potassium chloride (MICRO-K) 10 MEQ CR capsule TAKE 2 CAPSULES TWICE DAILY  . ranitidine (ZANTAC) 300 MG tablet TAKE 1 TABLET AT BEDTIME  . tamoxifen (NOLVADEX) 20 MG tablet Take 20 mg by mouth daily.  . traMADol (ULTRAM) 50 MG  tablet Take 1 tablet (50 mg total) by mouth 3 (three) times daily as needed.  . traZODone (DESYREL) 50 MG tablet Take 50 mg by mouth at bedtime. For sleep   . VENTOLIN HFA 108 (90 Base) MCG/ACT inhaler INHALE ONE TO TWO PUFFS INTO LUNGS EVERY 4 TO 6 HOURS AS NEEDED  . vitamin B-12 (CYANOCOBALAMIN) 1000 MCG tablet Take 1,000 mcg by mouth daily.  . fluticasone furoate-vilanterol (BREO ELLIPTA) 200-25 MCG/INH AEPB Inhale 1 puff into the lungs daily.  . Fluticasone-Salmeterol (ADVAIR DISKUS) 500-50 MCG/DOSE AEPB Inhale 1 puff into the lungs 2 (two) times daily.   No facility-administered encounter medications on file as of 05/09/2017.     No Known Allergies    Immunization History  Administered Date(s) Administered  . Influenza Split 08/14/2011, 07/11/2012, 07/16/2013, 07/24/2014  . Influenza, High Dose Seasonal PF 07/30/2015, 07/18/2016  . Influenza-Unspecified 06/30/2015  . Pneumococcal Conjugate-13 01/12/2011  . Pneumococcal Polysaccharide-23 07/16/2013    Current Medications, Allergies, Past Medical History, Past Surgical History, Family History, and Social History were reviewed in [employer] record.   Review of Systems         See HPI - all other systems neg except as noted... The patient complains of dyspnea on exertion, incontinence, and difficulty walking.  The patient denies anorexia, fever, weight loss, weight gain, vision loss, decreased hearing, hoarseness, chest pain, syncope, peripheral edema, prolonged cough, headaches, hemoptysis, abdominal pain, Rachael, hematochezia, severe indigestion/heartburn, hematuria, muscle weakness, suspicious skin lesions, transient blindness, depression, unusual weight change, abnormal bleeding, enlarged lymph nodes, and angioedema.     Objective:   Physical Exam     WD, Overweight, 71 y/o BF in NAD... GENERAL:  Alert & oriented; pleasant & cooperative...  HEENT:  [place]/AT, EOM-wnl, PERRLA, EACs-clear, TMs-wnl,  NOSE-clear, THROAT-clear & wnl. NECK:  Supple w/ fairROM; no JVD; normal carotid impulses w/o bruits; no thyromegaly or nodules palpated; no lymphadenopathy. CHEST:  decr BS bilat but clear- no wheezing/ rales/ rhonchi/ or signs of consolidation... HEART:  Regular Rhythm; without murmurs/ rubs/ or gallops detected... ABDOMEN:  Obese, sl distended, decr BS, & tender epig; no organomegaly or masses palpated... EXT: without deformities, mild arthritic changes; no varicose veins/ +venous insuffic/ no edema. NEURO:  CN's intact;  no focal neuro deficits... DERM:  No lesions noted; no rash etc...  RADIOLOGY DATA:  Reviewed in the EPIC EMR & discussed w/ the patient...  LABORATORY DATA:  Reviewed in the EPIC EMR & discussed w/ the patient...   ASSESSMENT & PLAN:    AR & ASTHMA>  Her chronic obstructive asthma has been better regulated w/ meds from the [place] [facility] w/ their samples etc; compliance has been an issue in the past; now supposed to be on Advair500Bid, NEBS w/ Albut & Ipratrop Tid, Singulair10, Hycodan prn; she has required occas rounds of antibiotics and Pred in the past so we will remind her to use the NEBULIZER w/ both meds Tid regularly + the Advair500Bid...  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  ADDENDUM>> with her norm IgE/RAST, and Eos~300 she would be a candidate for [facility] & we will consider this in follow up... 11/29/16>   The FeNO result is very surprising as she has been on the MEDROL 4mg  daily for ~28mo now AND she feels much better c/w a steroid benefit to her airways dis; we reviewed her prev lab results w/ norm IgE & RAST, and CBC w/  6.6% eos before the Medrol- she is a candidate for Nucala/ Fasenra but declines the biologic & prefers to continue Medrol slow taper given her clinical response; in this regard we will decr her Medrol4mg  to 1 tab alternate w/ 1/2 tab for Feb & cut further to 4mg  Qod starting in March w/ ROV recheck in 44mo. 02/07/17>   We decided to refill her Medrol 4mg  one tab QOD & encourage compliance w/ Advair500Bid, NEBS w/ albut/ipratrop, Singulair10, etc... We will plan rov recheck in 6mo... 05/09/17>   Rachael Peck is stable, we discussed continuing the same meds for now & call for any interval problems...    GI> Hx GERD, Divertics> more abd findings than chest findings today- hx diarrhea, distended, decr BS, epig tender; we will add PPI- Protonix40, continue Zantac300, add Align to her Activia 7 plan f/u appt w/ GI- [facility] [place]...  << Medical problems followed by her primary care team & specialists at [facility] >>  HBP>   AtypCP & Palpit>  Breast Cancer>   Stress Incont>  TIA>   Anxiety>  She needs to take the Alpraz0.25 tid as discussed...   Patient's Medications  New Prescriptions   FLUTICASONE FUROATE-VILANTEROL (BREO ELLIPTA) 200-25 MCG/INH AEPB    Inhale 1 puff into the lungs daily.  Previous Medications   ALBUTEROL (PROVENTIL) (2.5 MG/3ML) 0.083% NEBULIZER SOLUTION    Take 3 mLs (2.5 mg total) by nebulization 2 (two) times daily.   ALPRAZOLAM (XANAX) 0.25 MG TABLET    Take 1 tablet (0.25 mg total) by mouth 3 (three) times daily as needed.   AMLODIPINE (NORVASC) 10 MG TABLET    Take 5 mg by mouth daily.    ARTIFICIAL TEARS OPHTHALMIC SOLUTION    As needed for dry eyes    ASPIRIN 325 MG TABLET    Take 325 mg by mouth daily.   CHOLECALCIFEROL (VITAMIN D) 1000 UNITS TABLET    Take 1,000 Units by mouth daily.   DICYCLOMINE (BENTYL) 20 MG TABLET    TAKE 1 TABLET THREE TIMES DAILY AS NEEDED FOR SPASMS.   FEXOFENADINE (ALLEGRA) 180 MG TABLET    Take 1 tablet (180 mg total) by mouth daily. Once a day as needed  for allergies   FLUTICASONE (FLONASE) 50 MCG/ACT NASAL SPRAY    USE TWO SPRAYS IN EACH NOSTRIL ONE TIME DAILY   FLUTICASONE-SALMETEROL (ADVAIR DISKUS) 500-50 MCG/DOSE AEPB    Inhale 1 puff into the lungs 2 (two) times daily.   FLUTICASONE-SALMETEROL (ADVAIR DISKUS) 500-50 MCG/DOSE AEPB    Inhale 1 puff into the lungs 2 (two) times daily.   HYDROCHLOROTHIAZIDE (HYDRODIURIL) 12.5 MG TABLET    Take 12.5 mg by mouth daily.   HYDROCODONE-HOMATROPINE (HYCODAN) 5-1.5 MG/5ML SYRUP    Take 5 mLs by mouth every 6 (six) hours as needed for cough.   HYOSCYAMINE (LEVSIN) 0.125 MG TABLET    Take 1 tablet (  0.125 mg total) by mouth 3 (three) times daily as needed for cramping.   IPRATROPIUM (ATROVENT) 0.02 % NEBULIZER SOLUTION    INHALE THE CONTENTS OF 1 VIAL VIA NEBULIZER THREE TIMES DAILY   LORATADINE (CLARITIN) 10 MG TABLET    Take 10 mg by mouth daily as needed.   MECLIZINE (ANTIVERT) 25 MG TABLET    TAKE ONE TABLET BY MOUTH THREE TIMES DAILY AS NEEDED FOR DIZZINESS OR NAUSEA.   METHYLPREDNISOLONE (MEDROL) 4 MG TABLET    Take 1 tablet (4 mg total) by mouth every other day.   METOPROLOL (LOPRESSOR) 100 MG TABLET    Take 100 mg by mouth daily.   MONTELUKAST (SINGULAIR) 10 MG TABLET    TAKE 1 TABLET AT BEDTIME   NAPROXEN (NAPROSYN) 500 MG TABLET    Take 500 mg by mouth 3 (three) times daily with meals.   PANTOPRAZOLE (PROTONIX) 40 MG TABLET    TAKE 1 TABLET DAILY  30 MINUTES PRIOR TO BREAKFAST   POTASSIUM CHLORIDE (MICRO-K) 10 MEQ CR CAPSULE    TAKE 2 CAPSULES TWICE DAILY   RANITIDINE (ZANTAC) 300 MG TABLET    TAKE 1 TABLET AT BEDTIME   TAMOXIFEN (NOLVADEX) 20 MG TABLET    Take 20 mg by mouth daily.   TRAMADOL (ULTRAM) 50 MG TABLET    Take 1 tablet (50 mg total) by mouth 3 (three) times daily as needed.   TRAZODONE (DESYREL) 50 MG TABLET    Take 50 mg by mouth at bedtime. For sleep    VENTOLIN HFA 108 (90 BASE) MCG/ACT INHALER    INHALE ONE TO TWO PUFFS INTO LUNGS EVERY 4 TO 6 HOURS AS NEEDED   VITAMIN B-12  (CYANOCOBALAMIN) 1000 MCG TABLET    Take 1,000 mcg by mouth daily.  Modified Medications   No medications on file  Discontinued Medications   No medications on file

## 2017-06-29 ENCOUNTER — Other Ambulatory Visit: Payer: Self-pay | Admitting: Pulmonary Disease

## 2017-06-29 ENCOUNTER — Telehealth: Payer: Self-pay | Admitting: Pulmonary Disease

## 2017-06-29 DIAGNOSIS — J449 Chronic obstructive pulmonary disease, unspecified: Secondary | ICD-10-CM

## 2017-06-29 NOTE — Telephone Encounter (Signed)
Pt is at [place] now, requesting an order for a portable battery powered nebulizer to be sent in.  Spoke with SN who ok'ed this order.   Order has been placed.  Nothing further needed.

## 2017-11-09 ENCOUNTER — Ambulatory Visit (INDEPENDENT_AMBULATORY_CARE_PROVIDER_SITE_OTHER)
Admission: RE | Admit: 2017-11-09 | Discharge: 2017-11-09 | Disposition: A | Discharge status: Home / Self Care | Payer: Medicare HMO | Source: Ambulatory Visit | Attending: Pulmonary Disease | Admitting: Pulmonary Disease

## 2017-11-09 ENCOUNTER — Ambulatory Visit (INDEPENDENT_AMBULATORY_CARE_PROVIDER_SITE_OTHER): Payer: Medicare HMO | Admitting: Pulmonary Disease

## 2017-11-09 ENCOUNTER — Encounter: Payer: Self-pay | Admitting: Pulmonary Disease

## 2017-11-09 VITALS — BP 104/70 | HR 101 | Ht 67.0 in | Wt 196.4 lb

## 2017-11-09 DIAGNOSIS — J301 Allergic rhinitis due to pollen: Secondary | ICD-10-CM

## 2017-11-09 DIAGNOSIS — J449 Chronic obstructive pulmonary disease, unspecified: Secondary | ICD-10-CM | POA: Diagnosis not present

## 2017-11-09 DIAGNOSIS — J4489 Other specified chronic obstructive pulmonary disease: Secondary | ICD-10-CM

## 2017-11-09 DIAGNOSIS — K219 Gastro-esophageal reflux disease without esophagitis: Secondary | ICD-10-CM

## 2017-11-09 MED ORDER — FLUTICASONE-SALMETEROL 500-50 MCG/DOSE IN AEPB: 1.0000 | INHALATION_SPRAY | Freq: Two times a day (BID) | RESPIRATORY_TRACT | 1 refills | Status: DC

## 2017-11-09 MED ORDER — ALBUTEROL SULFATE HFA 108 (90 BASE) MCG/ACT IN AERS: INHALATION_SPRAY | RESPIRATORY_TRACT | 0 refills | Status: DC

## 2017-11-09 MED ORDER — METHYLPREDNISOLONE 4 MG PO TABS: 4.0000 mg | ORAL_TABLET | ORAL | 1 refills | Status: DC

## 2017-11-09 MED ORDER — ALBUTEROL SULFATE (2.5 MG/3ML) 0.083% IN NEBU: 2.5000 mg | INHALATION_SOLUTION | Freq: Two times a day (BID) | RESPIRATORY_TRACT | 1 refills | Status: DC

## 2017-11-09 MED ORDER — FLUTICASONE-SALMETEROL 500-50 MCG/DOSE IN AEPB: 1.0000 | INHALATION_SPRAY | Freq: Two times a day (BID) | RESPIRATORY_TRACT | 0 refills | Status: DC

## 2017-11-09 MED ORDER — FLUTICASONE PROPIONATE 50 MCG/ACT NA SUSP: NASAL | 1 refills | Status: DC

## 2017-11-09 NOTE — Progress Notes (Signed)
Subjective:    Patient ID: Rachael Peck, female    DOB: 01-06-1946, 72 y.o.   MRN: [id number]  HPI 72 y/o BF here for a follow up visit... she has multiple medical problems including:  AR;  Asthma;  HBP;  Hx atypCP & palpit;  Overweight;  GERD;  Divertics;  Stress incontinence;  Anxiety... She lives in [place] and she tells me she is followed for general medical purposes in the [facility] & was referred to [place] for cardiac eval- we do not have any of these records- she denies recurrent CP, palpit, etc...  ~  SEE PREV EPIC NOTES FOR OLDER DATA >>    Only labs/ XRays here were in 2010 as she assures me that XRays, EKGs, LABS all done at [facility] (we have requested records)...  CXR 5/15 showed improved RLL & no evid of pneumonia seen, sl peribronch thickening, otherw neg/ NAD...  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   LABS are all done at [facility]  ~  August 21, 2014:  46mo ROV and Rachael Peck has been doing reasonably well- but notes recent resp exac w/ choking spells at night assoc w/ cough, chest tightness, some wheezing, "hard to breathe", etc;  I am very suspicious for LPR, reflux related resp exac, & she notes "choking more";  She has been on diet (weight unchanged), taking Advair250Bid, NEBS w/ Duoneb Bid, Singulair10, and using Hycodan/ Tessalon prn;  She also takes Media regularly;  For the chronic GERD/ reflux she is on ZANTAC300 Qhs, and Levsin0.125mg  prn...  She continues her regular medical f/u [place] and specialty f/u at [facility]...  Exam shows clear chest, sl decr BS bilat but no rales, rhonchi, or signs of consolidation... Last CXR was 5/15 showing norm heart size, clear lungs x mild incr markings at bases, NAD, mod thoracic spondylosis...     We reviewed prob list, meds, xrays; note- labs all done by her PrimaryCare in [place]>>  PLAN>>  We discussed need for further GI eval of her LPR,  choking episodes, etc because they are clearly related to her AB exac, coughing, etc;  Rec referral to [place] (she has seen them in the past) for further eval- ?Ba swallow, EGD, etc;  In the meanwhile continue Rx w/ Advair250, NEBS w/ Duoneb, Singulair, and vigorous antireflux regimen...   ~  Mar 10, 2015:  64mo ROV & f/u Asthma w/ component of fixed obstructive dis> at time of her last OV we referred her to [place] (as above) but she never went;  Presents today c/o 3d hx cough, sm amt clear white sput, no hemoptysis, mild SOB & chest discomfort, denies f/c/s and she is vague/ not sure what brought this on/ etc... We reviewed the following medical problems during today's office visit >>     AR/ Asthma> on Allegra/ Flonase, NEBS w/ Albut/Ipratrop prn, Advair250Bid, Singulair10; ?[place] meds in [place]? Asked to add Mucinex-2Bid + fluids; 5/16 presents w/ ABexac=> Rx w/ Depo80, dosepak, ZPak...     HBP> on Aten100, Amlod10-1/2, HCT12.5 & K10-2Bid; BP= 134/76 & she denies CP, palpit, ch in SOB, edema...    Overweight> "Im watching my diet" and weight = 205# (no real change) & BMI=31; we reviewed diet & exercise strategies...    GI> GERD, Divertics> on Zantac300Qhs, & Levsin prn; denies abd pain, n/v, c/d, blood seen; she is due for a follow up  colonoscopy- encouraged to set this up thru her contacts at [facility]...    GU> Stress Incont> prev on Oxybut but off now; she states voiding is better & leaking less, doesn't want meds...    BREAST CANCER> Dx and treated at [facility] in 2014> Surgery, XRT, Oncology- on Tamoxifen now... NO OLD RECORDS AVAIL IN CareEverywhere?    TIA> on ASA81; followed at [facility], she denies cerebral ischemic symptoms...    Anxiety> on Trazodone50Qhs, Alprazolam0.25mg  prn; she is generally stable... We reviewed prob list, meds, xrays and labs> see below for updates >>   CXR 5/16 showed norm heart size, clear lungs, NAD...   Spirometry 5/16 showed FVC=2.16 (73%), FEV1=1.42 (63%), %1sec=66,  mid-flows are reduced at 30% predicted; c/w mod airflow obstruction and GOLD Stage 2 COPD...     IMP/PLAN>>  Rachael Peck appears to have an AB exac, and an element of fixed obstruction due to her poorly controlled asthma over the yrs; Rec to treat w/ Depo80, Pred dosepak, & ZPak at this time; she will continue to use her NEBULIZER w/ Duoneb Tid, NOIBBC488 Bid, Singulair10, etc...   ~  August 10, 2015:  53mo ROV & add-on appt requested by pt> presents c/o feeling bad over the past month, assoc w/ incr cough & mucus; she called & we phoned in Pred which helped; now c/o an "attack" today w/ incr cough, clear phlegm, no hemoptysis, and incr SOB although she is quite vague & unsure of her symptoms- says she had some tightness but "just a little wheezing"; on exam she is in no distress and chest is clear w/o wheezing/ rales/ rhonchi; her abd however is sl distended w/ +epig tenderness on palpation assoc w/ decr BS; she notes recent diarrhea & taking Activia, notes some left sided abd discomfort but denies reflux, dysphagia, n/v, blood seen, etc...     AR/ Asthma> on Allegra/ Flonase, NEBS w/ Albut/Ipratrop Bid-Tid, Advair250Bid, Singulair10; ?[place] meds in [place]? Prev asked to add Mucinex-2Bid + fluids; presents 10/16 w/ incr SOB but chest is clear & just finished Pred called in for her; we decided to incr to ADVAIR500-Bid + use the NEBS Tid regularly...    HBP> on Aten100, Amlod10-1/2, HCT12.5 & K10-2Bid; BP= 140/72 & she denies CP, palpit, edema...    Overweight> "Im watching my diet" and weight down 6# to 199# & BMI=30; we reviewed diet & exercise strategies...    GI> GERD, Divertics> on Zantac300Qhs, & Levsin prn; she has epig tender, distention, decr BS; she is due for a follow up colonoscopy- we will refer to her GI in [place] [facility]...    GU> Stress Incont> prev on Oxybut but off now; she states voiding is better & leaking less, doesn't want meds...    BREAST CANCER> Dx and treated at [facility] in 2014> Surgery,  XRT, Oncology- on Tamoxifen now... NO OLD RECORDS AVAIL IN CareEverywhere?    TIA> on ASA81; followed at [facility], she denies cerebral ischemic symptoms...    Anxiety> on Trazodone50Qhs, Alprazolam0.25mg  prn; she is quite stressed & asked to take the alpraz0.25 tid to help...  We reviewed prob list, meds, xrays and labs> see below for updates >>     PLAN>>  We reviewed her prev CXR & spirometry- several interval calls w/ Pred called in for her; currently c/o "attack" today but exam is clear w/o tightness or wheezing; we decided to incr the ADVAIR to the 500 inhaler- still one inhalation Bid & she is encouraged to take the Alpraz0.25 tid to help  w/ her nerves;  She alos has on-going GI issues- c/o diarrhea but exam is distended, sl tender, decr BS- encouraged to follow antireflux regimen, add PROTONIX40 Qam, take the Zantac Qhs, add Align to the Activia, and f/u w/ her GI=DrConway in [place]...  ~  March 29, 2016:  7-95mo ROV & Rachael Peck returns for a pulmonary follow up visit>  She tells me that she has recently been house-bound due to the springtime/ pollen/ trees and notes coughing and "resp attacks";  She has been taking Advair500Bid, Singulair10, NEBS w/ Albut & Ipratropium Bid, VentolinHFA (using it 3-4 times daily), plus her Allegra180 & Flonase=> and she notes very joyously that "all this has brought me back to life" but she is requesting a regular cough medication (we discussed trial TRAMADOL 50mg  Tid prn);  She has not required any ER visits/ hospitalizations/ nor has she called here or [facility] for additional meds/ steroids/ etc... 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     12/30/15> she was seen in the [facility] clinic by DrAvery & notes to be doing well on her Tamoxifen, few hot flashes, note reviewed- exam was OK, Labs OK, Mammogram OK, & they plan ROV 20mo...    11/26/15> she was seen in the GI clinic by [facility], GERD, LPR; on Protonix40, Zantac300, +lifestyle modifications and she was much improved; they plan recheck in 83mo & try to get her to reduce the PPI dose... EXAM shows Afeb, VSS, O2sat=100% on RA;  HEENT- neg, mallampati2;  Chest- clerar w/o w/r/r;  Heart- RR w/o m/r/g;  Abd- soft, nontender, neg;  Ext- neg w/o c/c/e;  Neuro- intact...   LABS 12/30/15 at [facility]>  Chems- wnl;  CBC- wnl w/ Hg=12.4, MCV=86, WBC=3.9  Mammogram 02/04/16 at [facility]>  Benign, scat fibroglandular densities, no suspicious mass or calcif seen, s/p lumpectomy on left  CXR 03/29/16>  Norm heart size, clear lungs- NAD, post op changes in left breast area... IMP/PLAN>>  Despite her intermittent symptoms- Srinidhi appears stable from the pulmonary standpoint & hasn't required ER visits, hospitalizations, office phone calls for additional meds, etc; she is pleased w/ her current med regimen & just wants to continue this the same=> meds refilled today & new Rx for TRAMADOL50mg Tid to try for her cough... We plan ROV in 41mo, sooner if needed for acute problems...   ~  October 04, 2016:  33mo ROV and pulmonary follow up visit>  Rachael Peck continues her medical follow up in [place] but states she has several issues se wishes to discuss w/ me- she is still on Aten100 but is being switched to Metoprolol when out;  She has noted an incr cough for the last 61mo, sore in her sides from coughing, chr stable SOB/DOE "not bad" she says, no f/c/s- she  wants a cough syrup...    Interval records from [place] reviewed in epic & summarized  here=> she has a long list of providers!    Medical f/u by PA-Cooper in [facility] 08/31/16> COPD, HBP, DM, OA- doing well, active, no issues;  FLP 11/17 showed TChol 143, TG 366, HDL 48, LDL 43 on diet alone...     She had f/u GI- [facility] 07/21/16> GERD- on Protonix40 & Zantac150Bid, pt was reluctant to decr meds & they decided to continue same...    She was seen by Heme-[facility] NP-[place] 06/29/16> left breast cancer dx 12/2012 (DCIS- ER/PR pos)& s/p left lumpectomy3/3/14, re-excision of pos margins 01/14/13, aqdjuvant XRT completed 03/25/13, Tamoxifen started 03/2013- c/o hot flashes... EXAM shows Afeb, VSS, O2sat=97% on RA;  HEENT- neg, mallampati2;  Chest- clear w/o w/r/r;  Heart- RR w/o m/r/g;  Abd- soft, nontender, neg;  Ext- neg w/o c/c/e;  Neuro- intact...   CXR 10/04/16>  SHE DID NOT GO TO XRAY for this film; Last CXR 03/29/16 reviewed by me in PACS- norm heart size, clear lungs, post op changes in left breast area, degen changes in Tspine...  Spirometry 10/04/16>  FVC=2.17 (75%), FEV1=1.44 (64%), %1sec=66, mid-flows reduced at 37% predicted   This is c/w a mod obstructive ventilatory defect...  FeNO 10/04/16>  FeNO = 125 => we decided to continue Advair500 & add Medrol4mg /d  LABS 10/04/16>  CBC- Hg=11.9, wbc=4.1, eos=6.6% (therefore eos~300);  IgE level=16;  RAST tests= all NEG IMP/PLAN>>  Rachael Peck has mild to mod COPD based on her long hx asthma w/ fixed airway obstruction; I am surprised that her FeNO is that high=> we are evaluating further w/ CBC/ diff to check Eos & and IgE/ RAST to see if a biological would be appropriate for her; in the meanwhile we will add MEDROL 4mg  Qam (low dose due to her IFG/DM diagnosis); we plan ROV recheck in 4wks...  ADDENDUM>> with her norm IgE/RAST, and Eos~300 she would be a candidate for [facility] Rx & we will consider this in follow up...  ~  November 29, 2016:  36mo ROV &  Rachael Peck notes tha "EVERYTHING IS BETTER" stating "I can breathe" and "I can talk";  She is getting out & about now & doing OK- more mobile etc;  On Medrol4mg /d & tol well, Advair500Bid, Singulair10, NEBS w/ albut & Ipratop Tid, + prn Hycodan/ Allegra/ Flonase;  We discussed her LABS w/ incr eos at 6-7% and normal IgE/ neg RAST => she's a candidate for biologic like Nucala/ Cinqair/ Berna Bue but she declines due to expense & the fact that she is improved on the Pred...     Prob List reviewed...    EXAM shows Afeb, VSS, O2sat=97% on RA;  HEENT- neg, mallampati2;  Chest- clear w/o w/r/r;  Heart- RR w/o m/r/g;  Abd- soft, nontender, neg;  Ext- neg w/o c/c/e;  Neuro- intact...   LABS 11/29/16>  FeNO = 151 IMP/PLAN>>  The FeNO result is very surprising as she has been on the MEDROL 4mg  daily for ~41mo now AND she feels much better c/w a steroid benefit to her airways dis; we reviewed her prev lab results w/ norm IgE & RAST, and CBC w/ 6.6% eos before the Medrol- she is a candidate for Nucala/ Fasenra but declines the biologic & prefers to continue Medrol slow taper given her clinical response; in this regard we will decr her Medrol4mg  to 1 tab alternate w/ 1/2 tab for Feb & cut further to 4mg  Qod starting in March w/ ROV recheck in 8mo...   ~  February 07, 2017:  79mo ROV &  pulmonary follow up visit... Rachael Peck states that her breathing was better & she was actively doing her regimen but then developed "siatica" & couldn't walk- PCP rx w/ Naprosyn & PT;  She also ran out of her Medrol (was on 4mg  qod) ~2wks ago & since then she notes that pollen is bothering her w/ several attacks, using her rescue inhaler more often, & she notes that she is in the "donut hole for Advair ($1000 per month she says)... In addition she says that her sister has a travel company & she is going to travel w/ her & she would like to have oxygen to take "just in case";  I explained Medicare's requirements for home O2 as she cannot self-pay & offered  ambulatory oximetry to see if she qualifies for O2...  We reviewed the following medical problems during today's office visit >>     AR/ Asthma> on Medrol- weaned to 4mg Qod, Allegra/ Flonase, NEBS w/ Albut/Ipratrop Bid-Tid, Advair500Bid, Singulair10; ?gets meds in [place]? Prev asked to add Mucinex-2Bid + fluids; she freq runs out, compliance is poor...    HBP> on Metop100, Amlod10-1/2, HCT12.5 & K10-2Bid; BP= 130/76 & she denies CP, palpit, edema...    Overweight> "Im watching my diet" and weight stable at 200# & BMI=30; we reviewed diet & exercise strategies...    GI> GERD, Divertics> on Protonix40 + Zantac300Qhs, & Bentyl prn; she has epig tender, distention, decr BS; she is followed by GI in [place] & we do not have records...    GU> Stress Incont> prev on Oxybut but off now; she states voiding is better & leaking less, doesn't want meds...    BREAST CANCER> Dx and treated at [facility] in 2014> Surgery, XRT, Oncology- on Tamoxifen now... NO OLD RECORDS AVAIL IN CareEverywhere?    TIA> on ASA81; followed at [facility], she denies cerebral ischemic symptoms...    Anxiety> on Trazodone50Qhs, Alprazolam0.25mg  prn; she is quite stressed & asked to take the Alpraz0.25 tid to help...  EXAM shows Afeb, VSS, O2sat=97% on RA;  HEENT- neg, mallampati2;  Chest- clear w/o w/r/r;  Heart- RR w/o m/r/g;  Abd- soft, nontender, neg;  Ext- neg w/o c/c/e;  Neuro- intact...   Ambulatory Oximetry 02/07/17>  O2sat=95% on RA at rest w/ pulse=96/min;  She ambulated just 2 Laps (185'ea) & stopped due to fatigue & pain; lowest O2sat=96% w/ pulse=108/min IMP/PLAN>>  We decided to refill her Medrol 4mg  one tab QOD & encourage compliance w/ Advair500Bid, NEBS w/ albut/ipratrop, Singulair10, etc... We will plan rov recheck in 28mo...  ~  May 09, 2017:  24mo ROV & pulmonary recheck> her PCP is the [facility] in [place]...  Followed for Asthma w/ COPD on Medrol 4mg  Qod, NEBS w/ Albut+Iprtrop Bid-Tid, Advair500Bid, Singulair10,  Antihist+Flonase, ProairHFA as needed...  She has never smoked but her asthma was only intermittently controlled due to financial constraints...  Prev CXR shows norm heart size, postop changes in left breast, clear lungs & no adenopathy, degen changes in Tspine...  Prev PFTs showed mod airflow obstruction w/ FEV1=1.44 (64%) c/w GOLD Stage2 COPD...  She has not been hypoxemic...  Prev IgE=16 and RAST panel was NEG; CBC w/ 4-6% eos (100-300 absolute)... She continues to do well on this regimen, no interim exac & notes min cough, clear spu, no hemoptysis, she still needs to incr activity... Care Everywhere records reviewed>    She saw ONC-DrAvery 02/01/17>  Left breast cancer- s/p lumpectomy 3/14, re-excision of pos margins 3/14, XRT fin 6/14, on  Tamoxifen since 6/14; doing well w/ LABS- Hg=11.9, WBC=3.5, , BS=130...    She was seen by Oncology-SHauser,NP 02/23/17> Breast Ca survivorship visit, note reviewed, s/p surg, XRT, on Tamoxifen; f/u mammogram was neg;     Seen in [place] 03/15/17>  COPD, HBP, DM2, OA, hx breast cancer; doing satis- A1c=6.2    Getting phys therapy in [place] for lumbosacral radiculopathy, notes reviewed- goals achieved & they stopped rx 03/22/17... EXAM shows Afeb, VSS, O2sat=99% on RA;  HEENT- neg, mallampati2;  Chest- clear w/o w/r/r;  Heart- RR w/o m/r/g;  Abd- soft, nontender, neg;  Ext- neg w/o c/c/e;  Neuro- intact...  IMP/PLAN>>  Rachael Peck is stable, we discussed continuing the same meds for now & call for any interval problems...    ~  November 09, 2017:  53mo ROV & pulmonary follow up visit>  Chery reports a quiet interval- no major medical changes, she notes mild cough, small amt clear sput, notes that SOB is improved, uses Tramadol when her side hurts (she says from coughing) & this works well for her- wants refill;  On Medrol4mg  Qod and doesn't want to stop, NEBS w/ Albut+Ipratrop Tid, Advair500Bid, Singulair10, Flonase & Claritin as needed...  Care  Everywhere records reviewed>      She had another Breast Cancer Survivorship Clinic visit 08/10/17>  Note reviewed, no evid of recurrent dis, continue Tamoxifen, they addressed bone health, pt has declined f/u colonoscopy...    She was seen by PCP- Adult Med, [facility] on 10/20/17>  DM management (intol ACE1 & they rec ARB rx addition), HL management (statin rx rec);  immuniz record updated & vaccines rec...     She has up-coming medical visits at [facility] 11/13/17 (PCP), 01/04/18 (Cards), 03/15/18 (Oncology) EXAM shows Afeb, VSS, O2sat=98% on RA;  HEENT- neg, mallampati2;  Chest- clear w/o w/r/r;  Heart- RR w/o m/r/g;  Abd- soft, nontender, neg;  Ext- neg w/o c/c/e;  Neuro- intact...   CXR 11/13/17 (independently reviewed by me in the PACS system) showed norm heart size, clear lungs- NAD, post surg changes in left breast...  IMP/PLAN>>  Rachael Peck continues to do satis from the pulmonary standpoint; we had a long discussion about her meds and specifically the Medrol4mg  tabs=> rec to try to decr dose to 1/2 tab Qod, otherw continue the NEBS, Advair, Singuliair, etc...           Problem List:    ALLERGIC RHINITIS (ICD-477.9) - on ALLEGRA 180mg /d, & FLONASE Qhs...   CHRONIC OBSTRUCTIVE ASTHMA UNSPECIFIED (ICD-493.20) - on ADVAIR 250Bid (but not compliant),  PROAIR HFA Prn (using 2-3 times daily), ATROVENT 2spQid (started by the [facility] in [place]), & MUCINEX 1-2Bid... ~  baseline CXR 11/08 showed sl hyperaeration, clear, NAD.Marland Kitchen. spondylitic changes in TSpine noted. ~  PFT 3/09 showed FVC= 3.36 (92%), FEV1= 2.13 (78%), %1sec= 63, mid-flows= 26% pred;  TLC= 6.61 (112%), RV= 3.25 (145%), RV/TLC= 49%;  DLCO= norm... ~  f/u CXR 8/10 showed min bibasilar atx, NAD... ~  PFT 2/11 showed FVC= 2.22 (73%), FEV1= 1.68 (70%), %1sec=76, mid-flows= 57%pred. ~  4/12:  73mo ROV here & she tells me she is getting her care thru the [facility] in [place]; just wants refills today- declines CXR, labs, etc; she hasn't  been taking the Advair due to cost & we discussed regimen w/ ALBUTEROL 2spQid, ATROVENT 2spQid, FLOVENT 220- 2spBid, Singulair 10mg /d, Mucinex 2Bid, fluids, etc... ~  4/13:  Yearly ROV & she remains stable, same meds &  followed in [place] as noted... ~  10/13:  Mild exac treated w/ her NEBS Qid, Advair250, Mucinex2Bid, Hycodan cough syrup... ~  7/14: on Allegra/ Flonase, NEBS w/ Albut/Ipratrop Qid, Advair250 (only uses samples due to $$) vs Flovent220-2slBid, Singulair10; ?gets meds in [place]? Asked to add Mucinex-2Bid + fluids. ~  4/15: presented w/ cough, yellow sput, incr SOB; CXR w/ RLL pneum; treated w/ Omnicef, Pred, Mucinex, etc & resolved... ~  5/15: follow up visit w/ resolution of pneumonia; encouraged to use meds regularly: on Allegra/ Flonase, NEBS w/ Albut/Ipratrop prn, Advair250Bid, Singulair10... ~  11/15: f/u visit w/ prominent LPR symptoms, choking, etc; we reviewed antireflux regimen, continue Advair250, NEBS, Singulair10, etc and refer to [facility] for further eval of her LPR/ choking episodes... ~  5/16: presents w/ AB exac and treated w/ Depo80, Pred dosepak, ZPak...  ~  CXR 5/16 showed norm heart size, clear lungs, NAD...  ~  Spirometry 5/16 showed FVC=2.16 (73%), FEV1=1.42 (63%), %1sec=66, mid-flows are reduced at 30% predicted; c/w mod airflow obstruction and GOLD Stage 2 COPD   FOLLOWED FOR PRIMARY CARE by [place] [facility] >>   HYPERTENSION (ICD-401.9) - controlled on ATENOLOL100, AMLODIPINE10-1/2, HCTZ12.5, KCl 10-2Bid;  BP= 134/76, we do not have notes from her Primary...  CHEST PAIN, ATYPICAL (ICD-786.59) - on ASA 81mg /d... she denies recurrent CP, exertional CP, etc...  PALPITATIONS (ICD-785.1) - hx palpitations in the past... no recent symptoms on meds above & she knows to avoid caffeine, etc...  OVERWEIGHT (ICD-278.02) - we reviewed diet + exercise; weight = 205# w/ BMI= 31-2...  GERD (ICD-530.81) - on ZANTAC 300mg Qhs... she has a hx of GERD and LPR prev controlled  on these meds but now symptoms have returned w/ prominent choking episodes & resp exac...   DIVERTICULOSIS OF COLON (ICD-562.10) - on Levsin prn; last colonoscopy 7/04 by DrJEdwards- showed divertics only; she now gets her GI follow up in [place]...  BREAST CANCER >> she noted blood in her bra from nipple disch, initial mammogram in [place] was neg but check up from her primary doctor ([facility] in [place]) lead to a bx from surg at [facility] w/ Dx of Breast Cancer> Surg- DrMcNutt (initial excision followed by 2nd surg for pos margins), XRT- DrBrown, Oncology- DrSwift now on Tamoxifen & doing very well by her report...  ~  See notes in Care Everywhere=> reviewed...  STRESS INCONTINENCE (ICD-788.39) - she claims leakage of urine and stool- she has been recommended to f/u w/ GYN for this eval....  TIA >> on ASA 81mg /d & prev on Plavix as well...  ANXIETY (ICD-300.00) - states that she is under stress due to her condition and having to care for her mother (on hospice w/ renal failure)... Rx for ALPRAZOLAM 0.25mg  Tid Prn written...   Past Surgical History:  Procedure Laterality Date  . ABDOMINAL HYSTERECTOMY    . BREAST LUMPECTOMY  12/17/2012 & 01/14/2013    Outpatient Encounter Medications as of 11/09/2017  Medication Sig  . albuterol (PROVENTIL) (2.5 MG/3ML) 0.083% nebulizer solution Take 3 mLs (2.5 mg total) by nebulization 2 (two) times daily.  Marland Kitchen albuterol (VENTOLIN HFA) 108 (90 Base) MCG/ACT inhaler INHALE ONE TO TWO PUFFS INTO LUNGS EVERY 4 TO 6 HOURS AS NEEDED  . ALPRAZolam (XANAX) 0.25 MG tablet Take 1 tablet (0.25 mg total) by mouth 3 (three) times daily as needed.  Marland Kitchen amLODipine (NORVASC) 10 MG tablet Take 5 mg by mouth daily.   . ARTIFICIAL TEARS ophthalmic solution As needed  for dry eyes   . aspirin 325 MG tablet Take 325 mg by mouth daily.  . cholecalciferol (VITAMIN D) 1000 units tablet Take 1,000 Units by mouth daily.  Marland Kitchen dicyclomine (BENTYL) 20 MG tablet TAKE 1 TABLET THREE  TIMES DAILY AS NEEDED FOR SPASMS.  . fexofenadine (ALLEGRA) 180 MG tablet Take 1 tablet (180 mg total) by mouth daily. Once a day as needed for allergies  . fluticasone (FLONASE) 50 MCG/ACT nasal spray USE TWO SPRAYS IN EACH NOSTRIL ONE TIME DAILY  . hydrochlorothiazide (HYDRODIURIL) 12.5 MG tablet Take 12.5 mg by mouth daily.  . hyoscyamine (LEVSIN) 0.125 MG tablet Take 1 tablet (0.125 mg total) by mouth 3 (three) times daily as needed for cramping.  Marland Kitchen ipratropium (ATROVENT) 0.02 % nebulizer solution INHALE THE CONTENTS OF 1 VIAL VIA NEBULIZER THREE TIMES DAILY  . KLOR-CON SPRINKLE 10 MEQ CR capsule TAKE 2 CAPSULES TWICE DAILY  . loratadine (CLARITIN) 10 MG tablet Take 10 mg by mouth daily as needed.  . meclizine (ANTIVERT) 25 MG tablet TAKE ONE TABLET BY MOUTH THREE TIMES DAILY AS NEEDED FOR DIZZINESS OR NAUSEA.  . methylPREDNISolone (MEDROL) 4 MG tablet Take 1 tablet (4 mg total) by mouth every other day.  . metoprolol (LOPRESSOR) 100 MG tablet Take 100 mg by mouth daily.  . montelukast (SINGULAIR) 10 MG tablet TAKE 1 TABLET AT BEDTIME  . naproxen (NAPROSYN) 500 MG tablet Take 500 mg by mouth 3 (three) times daily with meals.  . pantoprazole (PROTONIX) 40 MG tablet TAKE 1 TABLET DAILY  30 MINUTES PRIOR TO BREAKFAST  . ranitidine (ZANTAC) 300 MG tablet TAKE 1 TABLET AT BEDTIME  . tamoxifen (NOLVADEX) 20 MG tablet Take 20 mg by mouth daily.  . traZODone (DESYREL) 50 MG tablet Take 50 mg by mouth at bedtime. For sleep   . vitamin B-12 (CYANOCOBALAMIN) 1000 MCG tablet Take 1,000 mcg by mouth daily.  . [DISCONTINUED] albuterol (PROVENTIL) (2.5 MG/3ML) 0.083% nebulizer solution Take 3 mLs (2.5 mg total) by nebulization 2 (two) times daily.  . [DISCONTINUED] albuterol (PROVENTIL) (2.5 MG/3ML) 0.083% nebulizer solution Take 3 mLs (2.5 mg total) by nebulization 2 (two) times daily.  . [DISCONTINUED] albuterol (VENTOLIN HFA) 108 (90 Base) MCG/ACT inhaler INHALE ONE TO TWO PUFFS INTO LUNGS EVERY 4 TO 6  HOURS AS NEEDED  . [DISCONTINUED] fluticasone (FLONASE) 50 MCG/ACT nasal spray USE TWO SPRAYS IN EACH NOSTRIL ONE TIME DAILY  . [DISCONTINUED] fluticasone (FLONASE) 50 MCG/ACT nasal spray USE TWO SPRAYS IN EACH NOSTRIL ONE TIME DAILY  . [DISCONTINUED] fluticasone furoate-vilanterol (BREO ELLIPTA) 200-25 MCG/INH AEPB Inhale 1 puff into the lungs daily.  . [DISCONTINUED] Fluticasone-Salmeterol (ADVAIR DISKUS) 500-50 MCG/DOSE AEPB Inhale 1 puff into the lungs 2 (two) times daily.  . [DISCONTINUED] Fluticasone-Salmeterol (ADVAIR DISKUS) 500-50 MCG/DOSE AEPB Inhale 1 puff into the lungs 2 (two) times daily.  . [DISCONTINUED] methylPREDNISolone (MEDROL) 4 MG tablet Take 1 tablet (4 mg total) by mouth every other day.  . [DISCONTINUED] VENTOLIN HFA 108 (90 Base) MCG/ACT inhaler INHALE ONE TO TWO PUFFS INTO LUNGS EVERY 4 TO 6 HOURS AS NEEDED  . Fluticasone-Salmeterol (ADVAIR DISKUS) 500-50 MCG/DOSE AEPB Inhale 1 puff into the lungs 2 (two) times daily for 6 days.  Marland Kitchen HYDROcodone-homatropine (HYCODAN) 5-1.5 MG/5ML syrup Take 5 mLs by mouth every 6 (six) hours as needed for cough. (Patient not taking: Reported on 11/09/2017)  . traMADol (ULTRAM) 50 MG tablet Take 1 tablet (50 mg total) by mouth 3 (three) times daily as needed. (Patient not  taking: Reported on 11/09/2017)  . [DISCONTINUED] Fluticasone-Salmeterol (ADVAIR DISKUS) 500-50 MCG/DOSE AEPB Inhale 1 puff into the lungs 2 (two) times daily.   No facility-administered encounter medications on file as of 11/09/2017.     No Known Allergies    Immunization History  Administered Date(s) Administered  . Influenza Split 08/14/2011, 07/11/2012, 07/16/2013, 07/24/2014, 07/16/2017  . Influenza, High Dose Seasonal PF 07/30/2015, 07/18/2016  . Influenza-Unspecified 06/30/2015  . Pneumococcal Conjugate-13 01/12/2011  . Pneumococcal Polysaccharide-23 07/16/2013    Current Medications, Allergies, Past Medical History, Past Surgical History, Family History, and  Social History were reviewed in [employer] record.   Review of Systems         See HPI - all other systems neg except as noted... The patient complains of dyspnea on exertion, incontinence, and difficulty walking.  The patient denies anorexia, fever, weight loss, weight gain, vision loss, decreased hearing, hoarseness, chest pain, syncope, peripheral edema, prolonged cough, headaches, hemoptysis, abdominal pain, Rachael, hematochezia, severe indigestion/heartburn, hematuria, muscle weakness, suspicious skin lesions, transient blindness, depression, unusual weight change, abnormal bleeding, enlarged lymph nodes, and angioedema.     Objective:   Physical Exam     WD, Overweight, 72 y/o BF in NAD... GENERAL:  Alert & oriented; pleasant & cooperative...  HEENT:  [place]/AT, EOM-wnl, PERRLA, EACs-clear, TMs-wnl, NOSE-clear, THROAT-clear & wnl. NECK:  Supple w/ fairROM; no JVD; normal carotid impulses w/o bruits; no thyromegaly or nodules palpated; no lymphadenopathy. CHEST:  decr BS bilat but clear- no wheezing/ rales/ rhonchi/ or signs of consolidation... HEART:  Regular Rhythm; without murmurs/ rubs/ or gallops detected... ABDOMEN:  Obese, sl distended, decr BS, & tender epig; no organomegaly or masses palpated... EXT: without deformities, mild arthritic changes; no varicose veins/ +venous insuffic/ no edema. NEURO:  CN's intact;  no focal neuro deficits... DERM:  No lesions noted; no rash etc...  RADIOLOGY DATA:  Reviewed in the EPIC EMR & discussed w/ the patient...  LABORATORY DATA:  Reviewed in the EPIC EMR & discussed w/ the patient...   ASSESSMENT & PLAN:    AR & ASTHMA>  Her chronic obstructive asthma has been better regulated w/ meds from the [place] [facility] w/ their samples etc; compliance has been an issue in the past; now supposed to be on Advair500Bid, NEBS w/ Albut & Ipratrop Tid, Singulair10, Hycodan prn; she has required occas rounds of  antibiotics and Pred in the past so we will remind her to use the NEBULIZER w/ both meds Tid regularly + the Advair500Bid...  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  ADDENDUM>> with her norm IgE/RAST, and Eos~300 she would be a candidate for [facility] Rx & we will consider this in follow up... 11/29/16>   The FeNO result is very surprising as she has been on the MEDROL 4mg  daily for ~56mo now AND she feels much better c/w a steroid benefit to her airways dis; we reviewed her prev lab results w/ norm IgE & RAST, and CBC w/ 6.6% eos before the Medrol- she is a candidate for Nucala/ Fasenra but declines the biologic & prefers to continue Medrol slow  taper given her clinical response; in this regard we will decr her Medrol4mg  to 1 tab alternate w/ 1/2 tab for Feb & cut further to 4mg  Qod starting in March w/ ROV recheck in 26mo. 02/07/17>   We decided to refill her Medrol 4mg  one tab QOD & encourage compliance w/ Advair500Bid, NEBS w/ albut/ipratrop, Singulair10, etc... We will plan rov recheck in 62mo... 05/09/17>   Rachael Peck is stable, we discussed continuing the same meds for now & call for any interval problems...  11/09/17>   Rachael Peck continues to do satis from the pulmonary standpoint; we had a long discussion about her meds and specifically the Medrol4mg  tabs=> rec to try to decr dose to 1/2 tab Qod, otherw continue the NEBS, Advair, Singuliair, etc.   GI> Hx GERD, Divertics> more abd findings than chest findings today- hx diarrhea, distended, decr BS, epig tender; we will add PPI- Protonix40, continue Zantac300, add Align to her Activia 7 plan f/u appt w/ [place]- [facility] [place]...  << Medical  problems followed by her primary care team & specialists at [facility] >>  HBP>   AtypCP & Palpit>  Breast Cancer>   Stress Incont>  TIA>   Anxiety>  She needs to take the Alpraz0.25 tid as discussed...   Patient's Medications  New Prescriptions   No medications on file  Previous Medications   ALPRAZOLAM (XANAX) 0.25 MG TABLET    Take 1 tablet (0.25 mg total) by mouth 3 (three) times daily as needed.   AMLODIPINE (NORVASC) 10 MG TABLET    Take 5 mg by mouth daily.    ARTIFICIAL TEARS OPHTHALMIC SOLUTION    As needed for dry eyes    ASPIRIN 325 MG TABLET    Take 325 mg by mouth daily.   CHOLECALCIFEROL (VITAMIN D) 1000 UNITS TABLET    Take 1,000 Units by mouth daily.   DICYCLOMINE (BENTYL) 20 MG TABLET    TAKE 1 TABLET THREE TIMES DAILY AS NEEDED FOR SPASMS.   FEXOFENADINE (ALLEGRA) 180 MG TABLET    Take 1 tablet (180 mg total) by mouth daily. Once a day as needed for allergies   HYDROCHLOROTHIAZIDE (HYDRODIURIL) 12.5 MG TABLET    Take 12.5 mg by mouth daily.   HYDROCODONE-HOMATROPINE (HYCODAN) 5-1.5 MG/5ML SYRUP    Take 5 mLs by mouth every 6 (six) hours as needed for cough.   HYOSCYAMINE (LEVSIN) 0.125 MG TABLET    Take 1 tablet (0.125 mg total) by mouth 3 (three) times daily as needed for cramping.   IPRATROPIUM (ATROVENT) 0.02 % NEBULIZER SOLUTION    INHALE THE CONTENTS OF 1 VIAL VIA NEBULIZER THREE TIMES DAILY   KLOR-CON SPRINKLE 10 MEQ CR CAPSULE    TAKE 2 CAPSULES TWICE DAILY   LORATADINE (CLARITIN) 10 MG TABLET    Take 10 mg by mouth daily as needed.   MECLIZINE (ANTIVERT) 25 MG TABLET    TAKE ONE TABLET BY MOUTH THREE TIMES DAILY AS NEEDED FOR DIZZINESS OR NAUSEA.   METOPROLOL (LOPRESSOR) 100 MG TABLET    Take 100 mg by mouth daily.   MONTELUKAST (SINGULAIR) 10 MG TABLET    TAKE 1 TABLET AT BEDTIME   NAPROXEN (NAPROSYN) 500 MG TABLET    Take 500 mg by mouth 3 (three) times daily with meals.   PANTOPRAZOLE (PROTONIX) 40 MG TABLET    TAKE 1 TABLET DAILY  30 MINUTES PRIOR TO BREAKFAST    RANITIDINE (ZANTAC) 300 MG TABLET    TAKE 1 TABLET AT BEDTIME  TAMOXIFEN (NOLVADEX) 20 MG TABLET    Take 20 mg by mouth daily.   TRAMADOL (ULTRAM) 50 MG TABLET    Take 1 tablet (50 mg total) by mouth 3 (three) times daily as needed.   TRAZODONE (DESYREL) 50 MG TABLET    Take 50 mg by mouth at bedtime. For sleep    VITAMIN B-12 (CYANOCOBALAMIN) 1000 MCG TABLET    Take 1,000 mcg by mouth daily.  Modified Medications   Modified Medication Previous Medication   ALBUTEROL (PROVENTIL) (2.5 MG/3ML) 0.083% NEBULIZER SOLUTION albuterol (PROVENTIL) (2.5 MG/3ML) 0.083% nebulizer solution      Take 3 mLs (2.5 mg total) by nebulization 2 (two) times daily.    Take 3 mLs (2.5 mg total) by nebulization 2 (two) times daily.   ALBUTEROL (VENTOLIN HFA) 108 (90 BASE) MCG/ACT INHALER VENTOLIN HFA 108 (90 Base) MCG/ACT inhaler      INHALE ONE TO TWO PUFFS INTO LUNGS EVERY 4 TO 6 HOURS AS NEEDED    INHALE ONE TO TWO PUFFS INTO LUNGS EVERY 4 TO 6 HOURS AS NEEDED   FLUTICASONE (FLONASE) 50 MCG/ACT NASAL SPRAY fluticasone (FLONASE) 50 MCG/ACT nasal spray      USE TWO SPRAYS IN EACH NOSTRIL ONE TIME DAILY    USE TWO SPRAYS IN EACH NOSTRIL ONE TIME DAILY   FLUTICASONE-SALMETEROL (ADVAIR DISKUS) 500-50 MCG/DOSE AEPB Fluticasone-Salmeterol (ADVAIR DISKUS) 500-50 MCG/DOSE AEPB      Inhale 1 puff into the lungs 2 (two) times daily for 6 days.    Inhale 1 puff into the lungs 2 (two) times daily.   METHYLPREDNISOLONE (MEDROL) 4 MG TABLET methylPREDNISolone (MEDROL) 4 MG tablet      Take 1 tablet (4 mg total) by mouth every other day.    Take 1 tablet (4 mg total) by mouth every other day.  Discontinued Medications   FLUTICASONE FUROATE-VILANTEROL (BREO ELLIPTA) 200-25 MCG/INH AEPB    Inhale 1 puff into the lungs daily.   FLUTICASONE-SALMETEROL (ADVAIR DISKUS) 500-50 MCG/DOSE AEPB    Inhale 1 puff into the lungs 2 (two) times daily.

## 2017-11-09 NOTE — Patient Instructions (Addendum)
Today we updated your med list in our EPIC system...    Continue your current medications the same...  Continue the NEBULIZER w/ Albuterol & Ipratropium 2-3 times daily... Continue the ADVAIR 500- one inhalation twice daily... Continue the SINGULAIR 10mg /d...  We discussed trying to slowly wean down the MEDROL 4mg  tabs- currently at one tab every other day...    Try to decrease to 1/2 tab every other day if able (but you may take the extra 1/2 tab if needed any day your breathing is not doing as well as we would like)...  Today we did a follow up CXR...    We will contact you w/ the results when available...   Call for any questions...  Let's plan a follow up visit in 79mo, sooner if needed for problems.Marland KitchenMarland Kitchen

## 2017-11-16 ENCOUNTER — Other Ambulatory Visit: Payer: Self-pay | Admitting: *Deleted

## 2017-11-16 MED ORDER — FLUTICASONE-SALMETEROL 500-50 MCG/DOSE IN AEPB: 1.0000 | INHALATION_SPRAY | Freq: Two times a day (BID) | RESPIRATORY_TRACT | 11 refills | Status: DC

## 2018-05-10 ENCOUNTER — Ambulatory Visit: Payer: Medicare HMO | Admitting: Pulmonary Disease

## 2018-05-15 ENCOUNTER — Ambulatory Visit: Payer: Medicare HMO | Admitting: Pulmonary Disease

## 2018-05-30 ENCOUNTER — Ambulatory Visit: Payer: Medicare HMO | Admitting: Pulmonary Disease

## 2018-05-30 ENCOUNTER — Encounter: Payer: Self-pay | Admitting: Pulmonary Disease

## 2018-05-30 VITALS — BP 140/80 | HR 88 | Temp 98.0°F | Ht 67.0 in | Wt 196.2 lb

## 2018-05-30 DIAGNOSIS — K219 Gastro-esophageal reflux disease without esophagitis: Secondary | ICD-10-CM

## 2018-05-30 DIAGNOSIS — J449 Chronic obstructive pulmonary disease, unspecified: Secondary | ICD-10-CM | POA: Diagnosis not present

## 2018-05-30 DIAGNOSIS — J301 Allergic rhinitis due to pollen: Secondary | ICD-10-CM

## 2018-05-30 MED ORDER — ALBUTEROL SULFATE HFA 108 (90 BASE) MCG/ACT IN AERS
INHALATION_SPRAY | RESPIRATORY_TRACT | 1 refills | Status: DC
Start: 2018-05-30 — End: 2018-09-26

## 2018-05-30 MED ORDER — METHYLPREDNISOLONE 4 MG PO TABS
ORAL_TABLET | ORAL | 1 refills | Status: DC
Start: 2018-05-30 — End: 2018-11-23

## 2018-05-30 MED ORDER — FLUTICASONE PROPIONATE 50 MCG/ACT NA SUSP: NASAL | 1 refills | Status: DC

## 2018-05-30 MED ORDER — IPRATROPIUM BROMIDE 0.02 % IN SOLN: RESPIRATORY_TRACT | 6 refills | Status: DC

## 2018-05-30 MED ORDER — ALBUTEROL SULFATE (2.5 MG/3ML) 0.083% IN NEBU: 2.5000 mg | INHALATION_SOLUTION | Freq: Two times a day (BID) | RESPIRATORY_TRACT | 1 refills | Status: DC

## 2018-05-30 NOTE — Progress Notes (Addendum)
Subjective:    Patient ID: Rachael Peck, female    DOB: April 22, 1946, 72 y.o.   MRN: [id number]  HPI 72 y/o BF here for a follow up visit... she has multiple medical problems including:  AR;  Asthma;  HBP;  Hx atypCP & palpit;  Overweight;  GERD;  Divertics;  Stress incontinence;  Anxiety... She lives in [place] and she tells me she is followed for general medical purposes in the [facility] & was referred to [place] for cardiac eval- we do not have any of these records- she denies recurrent CP, palpit, etc...  ~  SEE PREV EPIC NOTES FOR OLDER DATA >>    Only labs/ XRays here were in 2010 as she assures me that XRays, EKGs, LABS all done at [facility] (we have requested records)...  CXR 5/15 showed improved RLL & no evid of pneumonia seen, sl peribronch thickening, otherw neg/ NAD...  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   LABS are all done at [facility]  ~  August 21, 2014:  89mo ROV and Rachael Peck has been doing reasonably well- but notes recent resp exac w/ choking spells at night assoc w/ cough, chest tightness, some wheezing, "hard to breathe", etc;  I am very suspicious for LPR, reflux related resp exac, & she notes "choking more";  She has been on diet (weight unchanged), taking Advair250Bid, NEBS w/ Duoneb Bid, Singulair10, and using Hycodan/ Tessalon prn;  She also takes [place] regularly;  For the chronic GERD/ reflux she is on ZANTAC300 Qhs, and Levsin0.125mg  prn...  She continues her regular medical f/u [place] and specialty f/u at [facility]...  Exam shows clear chest, sl decr BS bilat but no rales, rhonchi, or signs of consolidation... Last CXR was 5/15 showing norm heart size, clear lungs x mild incr markings at bases, NAD, mod thoracic spondylosis...     We reviewed prob list, meds, xrays; note- labs all done by her PrimaryCare in [place]>>  PLAN>>  We discussed need for further GI eval of her LPR,  choking episodes, etc because they are clearly related to her AB exac, coughing, etc;  Rec referral to [place] (she has seen them in the past) for further eval- ?Ba swallow, EGD, etc;  In the meanwhile continue Rx w/ Advair250, NEBS w/ Duoneb, Singulair, and vigorous antireflux regimen...   ~  Mar 10, 2015:  68mo ROV & f/u Asthma w/ component of fixed obstructive dis> at time of her last OV we referred her to [place] (as above) but she never went;  Presents today c/o 3d hx cough, sm amt clear white sput, no hemoptysis, mild SOB & chest discomfort, denies f/c/s and she is vague/ not sure what brought this on/ etc... We reviewed the following medical problems during today's office visit >>     AR/ Asthma> on Allegra/ Flonase, NEBS w/ Albut/Ipratrop prn, Advair250Bid, Singulair10; ?gets meds in [place] clinic? Asked to add Mucinex-2Bid + fluids; 5/16 presents w/ ABexac=> Rx w/ Depo80, dosepak, ZPak...     HBP> on Aten100, Amlod10-1/2, HCT12.5 & K10-2Bid; BP= 134/76 & she denies CP, palpit, ch in SOB, edema...    Overweight> "Im watching my diet" and weight = 205# (no real change) & BMI=31; we reviewed diet & exercise strategies...    GI> GERD, Divertics> on Zantac300Qhs, & Levsin prn; denies abd pain, n/v, c/d, blood seen; she is due for a follow up  colonoscopy- encouraged to set this up thru her contacts at [facility]...    GU> Stress Incont> prev on Oxybut but off now; she states voiding is better & leaking less, doesn't want meds...    BREAST CANCER> Dx and treated at [facility] in 2014> Surgery, XRT, Oncology- on Tamoxifen now... NO OLD RECORDS AVAIL IN CareEverywhere?    TIA> on ASA81; followed at [facility], she denies cerebral ischemic symptoms...    Anxiety> on Trazodone50Qhs, Alprazolam0.25mg  prn; she is generally stable... We reviewed prob list, meds, xrays and labs> see below for updates >>   CXR 5/16 showed norm heart size, clear lungs, NAD...   Spirometry 5/16 showed FVC=2.16 (73%), FEV1=1.42 (63%), %1sec=66,  mid-flows are reduced at 30% predicted; c/w mod airflow obstruction and GOLD Stage 2 COPD...     IMP/PLAN>>  Rachael Peck appears to have an AB exac, and an element of fixed obstruction due to her poorly controlled asthma over the yrs; Rec to treat w/ Depo80, Pred dosepak, & ZPak at this time; she will continue to use her NEBULIZER w/ Duoneb Tid, NOIBBC488 Bid, Singulair10, etc...   ~  August 10, 2015:  53mo ROV & add-on appt requested by pt> presents c/o feeling bad over the past month, assoc w/ incr cough & mucus; she called & we phoned in Pred which helped; now c/o an "attack" today w/ incr cough, clear phlegm, no hemoptysis, and incr SOB although she is quite vague & unsure of her symptoms- says she had some tightness but "just a little wheezing"; on exam she is in no distress and chest is clear w/o wheezing/ rales/ rhonchi; her abd however is sl distended w/ +epig tenderness on palpation assoc w/ decr BS; she notes recent diarrhea & taking Activia, notes some left sided abd discomfort but denies reflux, dysphagia, n/v, blood seen, etc...     AR/ Asthma> on Allegra/ Flonase, NEBS w/ Albut/Ipratrop Bid-Tid, Advair250Bid, Singulair10; ?[place] meds in [place]? Prev asked to add Mucinex-2Bid + fluids; presents 10/16 w/ incr SOB but chest is clear & just finished Pred called in for her; we decided to incr to ADVAIR500-Bid + use the NEBS Tid regularly...    HBP> on Aten100, Amlod10-1/2, HCT12.5 & K10-2Bid; BP= 140/72 & she denies CP, palpit, edema...    Overweight> "Im watching my diet" and weight down 6# to 199# & BMI=30; we reviewed diet & exercise strategies...    GI> GERD, Divertics> on Zantac300Qhs, & Levsin prn; she has epig tender, distention, decr BS; she is due for a follow up colonoscopy- we will refer to her GI in [place] [facility]...    GU> Stress Incont> prev on Oxybut but off now; she states voiding is better & leaking less, doesn't want meds...    BREAST CANCER> Dx and treated at [facility] in 2014> Surgery,  XRT, Oncology- on Tamoxifen now... NO OLD RECORDS AVAIL IN CareEverywhere?    TIA> on ASA81; followed at [facility], she denies cerebral ischemic symptoms...    Anxiety> on Trazodone50Qhs, Alprazolam0.25mg  prn; she is quite stressed & asked to take the alpraz0.25 tid to help...  We reviewed prob list, meds, xrays and labs> see below for updates >>     PLAN>>  We reviewed her prev CXR & spirometry- several interval calls w/ Pred called in for her; currently c/o "attack" today but exam is clear w/o tightness or wheezing; we decided to incr the ADVAIR to the 500 inhaler- still one inhalation Bid & she is encouraged to take the Alpraz0.25 tid to help  w/ her nerves;  She alos has on-going GI issues- c/o diarrhea but exam is distended, sl tender, decr BS- encouraged to follow antireflux regimen, add PROTONIX40 Qam, take the Zantac Qhs, add Align to the Activia, and f/u w/ her GI=[facility] in [place]...  ~  March 29, 2016:  7-95mo ROV & Rachael Peck returns for a pulmonary follow up visit>  She tells me that she has recently been house-bound due to the springtime/ pollen/ trees and notes coughing and "resp attacks";  She has been taking Advair500Bid, Singulair10, NEBS w/ Albut & Ipratropium Bid, VentolinHFA (using it 3-4 times daily), plus her Allegra180 & Flonase=> and she notes very joyously that "all this has brought me back to life" but she is requesting a regular cough medication (we discussed trial TRAMADOL 50mg  Tid prn);  She has not required any ER visits/ hospitalizations/ nor has she called here or [facility] for additional meds/ steroids/ etc... 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     12/30/15> she was seen in the [facility] clinic by DrAvery & notes to be doing well on her Tamoxifen, few hot flashes, note reviewed- exam was OK, Labs OK, Mammogram OK, & they plan ROV 20mo...    11/26/15> she was seen in the GI clinic by [facility], GERD, LPR; on Protonix40, Zantac300, +lifestyle modifications and she was much improved; they plan recheck in 83mo & try to get her to reduce the PPI dose... EXAM shows Afeb, VSS, O2sat=100% on RA;  HEENT- neg, mallampati2;  Chest- clerar w/o w/r/r;  Heart- RR w/o m/r/g;  Abd- soft, nontender, neg;  Ext- neg w/o c/c/e;  Neuro- intact...   LABS 12/30/15 at [facility]>  Chems- wnl;  CBC- wnl w/ Hg=12.4, MCV=86, WBC=3.9  Mammogram 02/04/16 at [facility]>  Benign, scat fibroglandular densities, no suspicious mass or calcif seen, s/p lumpectomy on left  CXR 03/29/16>  Norm heart size, clear lungs- NAD, post op changes in left breast area... IMP/PLAN>>  Despite her intermittent symptoms- Srinidhi appears stable from the pulmonary standpoint & hasn't required ER visits, hospitalizations, office phone calls for additional meds, etc; she is pleased w/ her current med regimen & just wants to continue this the same=> meds refilled today & new Rx for TRAMADOL50mg Tid to try for her cough... We plan ROV in 41mo, sooner if needed for acute problems...   ~  October 04, 2016:  33mo ROV and pulmonary follow up visit>  Rachael Peck continues her medical follow up in [place] but states she has several issues se wishes to discuss w/ me- she is still on Aten100 but is being switched to Metoprolol when out;  She has noted an incr cough for the last 61mo, sore in her sides from coughing, chr stable SOB/DOE "not bad" she says, no f/c/s- she  wants a cough syrup...    Interval records from [place] reviewed in epic & summarized  here=> she has a long list of providers!    Medical f/u by PA-Cooper in [facility] 08/31/16> COPD, HBP, DM, OA- doing well, active, no issues;  FLP 11/17 showed TChol 143, TG 366, HDL 48, LDL 43 on diet alone...     She had f/u GI- [facility] 07/21/16> GERD- on Protonix40 & Zantac150Bid, pt was reluctant to decr meds & they decided to continue same...    She was seen by Heme-[facility] NP-[facility] 06/29/16> left breast cancer dx 12/2012 (DCIS- ER/PR pos)& s/p left lumpectomy3/3/14, re-excision of pos margins 01/14/13, aqdjuvant XRT completed 03/25/13, Tamoxifen started 03/2013- c/o hot flashes... EXAM shows Afeb, VSS, O2sat=97% on RA;  HEENT- neg, mallampati2;  Chest- clear w/o w/r/r;  Heart- RR w/o m/r/g;  Abd- soft, nontender, neg;  Ext- neg w/o c/c/e;  Neuro- intact...   CXR 10/04/16>  SHE DID NOT GO TO XRAY for this film; Last CXR 03/29/16 reviewed by me in PACS- norm heart size, clear lungs, post op changes in left breast area, degen changes in Tspine...  Spirometry 10/04/16>  FVC=2.17 (75%), FEV1=1.44 (64%), %1sec=66, mid-flows reduced at 37% predicted   This is c/w a mod obstructive ventilatory defect...  FeNO 10/04/16>  FeNO = 125 => we decided to continue Advair500 & add Medrol4mg /d  LABS 10/04/16>  CBC- Hg=11.9, wbc=4.1, eos=6.6% (therefore eos~300);  IgE level=16;  RAST tests= all NEG IMP/PLAN>>  Rachael Peck has mild to mod COPD based on her long hx asthma w/ fixed airway obstruction; I am surprised that her FeNO is that high=> we are evaluating further w/ CBC/ diff to check Eos & and IgE/ RAST to see if a biological would be appropriate for her; in the meanwhile we will add MEDROL 4mg  Qam (low dose due to her IFG/DM diagnosis); we plan ROV recheck in 4wks...  ADDENDUM>> with her norm IgE/RAST, and Eos~300 she would be a candidate for [facility] Rx & we will consider this in follow up...  ~  November 29, 2016:  36mo ROV &  Rachael Peck notes tha "EVERYTHING IS BETTER" stating "I can breathe" and "I can talk";  She is getting out & about now & doing OK- more mobile etc;  On Medrol4mg /d & tol well, Advair500Bid, Singulair10, NEBS w/ albut & Ipratop Tid, + prn Hycodan/ Allegra/ Flonase;  We discussed her LABS w/ incr eos at 6-7% and normal IgE/ neg RAST => she's a candidate for biologic like Nucala/ Cinqair/ Berna Bue but she declines due to expense & the fact that she is improved on the Pred...     Prob List reviewed...    EXAM shows Afeb, VSS, O2sat=97% on RA;  HEENT- neg, mallampati2;  Chest- clear w/o w/r/r;  Heart- RR w/o m/r/g;  Abd- soft, nontender, neg;  Ext- neg w/o c/c/e;  Neuro- intact...   LABS 11/29/16>  FeNO = 151 IMP/PLAN>>  The FeNO result is very surprising as she has been on the MEDROL 4mg  daily for ~41mo now AND she feels much better c/w a steroid benefit to her airways dis; we reviewed her prev lab results w/ norm IgE & RAST, and CBC w/ 6.6% eos before the Medrol- she is a candidate for Nucala/ Fasenra but declines the biologic & prefers to continue Medrol slow taper given her clinical response; in this regard we will decr her Medrol4mg  to 1 tab alternate w/ 1/2 tab for Feb & cut further to 4mg  Qod starting in March w/ ROV recheck in 8mo...   ~  February 07, 2017:  79mo ROV &  pulmonary follow up visit... Rachael Peck states that her breathing was better & she was actively doing her regimen but then developed "siatica" & couldn't walk- PCP rx w/ Naprosyn & PT;  She also ran out of her Medrol (was on 4mg  qod) ~2wks ago & since then she notes that pollen is bothering her w/ several attacks, using her rescue inhaler more often, & she notes that she is in the "donut hole for Advair ($1000 per month she says)... In addition she says that her sister has a travel company & she is going to travel w/ her & she would like to have oxygen to take "just in case";  I explained Medicare's requirements for home O2 as she cannot self-pay & offered  ambulatory oximetry to see if she qualifies for O2...  We reviewed the following medical problems during today's office visit >>     AR/ Asthma> on Medrol- weaned to 4mg Qod, Allegra/ Flonase, NEBS w/ Albut/Ipratrop Bid-Tid, Advair500Bid, Singulair10; ?gets meds in [place]? Prev asked to add Mucinex-2Bid + fluids; she freq runs out, compliance is poor...    HBP> on Metop100, Amlod10-1/2, HCT12.5 & K10-2Bid; BP= 130/76 & she denies CP, palpit, edema...    Overweight> "Im watching my diet" and weight stable at 200# & BMI=30; we reviewed diet & exercise strategies...    GI> GERD, Divertics> on Protonix40 + Zantac300Qhs, & Bentyl prn; she has epig tender, distention, decr BS; she is followed by GI in [place] & we do not have records...    GU> Stress Incont> prev on Oxybut but off now; she states voiding is better & leaking less, doesn't want meds...    BREAST CANCER> Dx and treated at [facility] in 2014> Surgery, XRT, Oncology- on Tamoxifen now... NO OLD RECORDS AVAIL IN CareEverywhere?    TIA> on ASA81; followed at [facility], she denies cerebral ischemic symptoms...    Anxiety> on Trazodone50Qhs, Alprazolam0.25mg  prn; she is quite stressed & asked to take the Alpraz0.25 tid to help...  EXAM shows Afeb, VSS, O2sat=97% on RA;  HEENT- neg, mallampati2;  Chest- clear w/o w/r/r;  Heart- RR w/o m/r/g;  Abd- soft, nontender, neg;  Ext- neg w/o c/c/e;  Neuro- intact...   Ambulatory Oximetry 02/07/17>  O2sat=95% on RA at rest w/ pulse=96/min;  She ambulated just 2 Laps (185'ea) & stopped due to fatigue & pain; lowest O2sat=96% w/ pulse=108/min IMP/PLAN>>  We decided to refill her Medrol 4mg  one tab QOD & encourage compliance w/ Advair500Bid, NEBS w/ albut/ipratrop, Singulair10, etc... We will plan rov recheck in 28mo...  ~  May 09, 2017:  24mo ROV & pulmonary recheck> her PCP is the [facility] in [place]...  Followed for Asthma w/ COPD on Medrol 4mg  Qod, NEBS w/ Albut+Iprtrop Bid-Tid, Advair500Bid, Singulair10,  Antihist+Flonase, ProairHFA as needed...  She has never smoked but her asthma was only intermittently controlled due to financial constraints...  Prev CXR shows norm heart size, postop changes in left breast, clear lungs & no adenopathy, degen changes in Tspine...  Prev PFTs showed mod airflow obstruction w/ FEV1=1.44 (64%) c/w GOLD Stage2 COPD...  She has not been hypoxemic...  Prev IgE=16 and RAST panel was NEG; CBC w/ 4-6% eos (100-300 absolute)... She continues to do well on this regimen, no interim exac & notes min cough, clear spu, no hemoptysis, she still needs to incr activity... Care Everywhere records reviewed>    She saw ONC-DrAvery 02/01/17>  Left breast cancer- s/p lumpectomy 3/14, re-excision of pos margins 3/14, XRT fin 6/14, on  Tamoxifen since 6/14; doing well w/ LABS- Hg=11.9, WBC=3.5, , BS=130...    She was seen by Oncology-SHauser,NP 02/23/17> Breast Ca survivorship visit, note reviewed, s/p surg, XRT, on Tamoxifen; f/u mammogram was neg;     Seen in [place] 03/15/17>  COPD, HBP, DM2, OA, hx breast cancer; doing satis- A1c=6.2    Getting phys therapy in [place] for lumbosacral radiculopathy, notes reviewed- goals achieved & they stopped rx 03/22/17... EXAM shows Afeb, VSS, O2sat=99% on RA;  HEENT- neg, mallampati2;  Chest- clear w/o w/r/r;  Heart- RR w/o m/r/g;  Abd- soft, nontender, neg;  Ext- neg w/o c/c/e;  Neuro- intact...  IMP/PLAN>>  Rachael Peck is stable, we discussed continuing the same meds for now & call for any interval problems...   ~  November 09, 2017:  76mo ROV & pulmonary follow up visit>  Rachael Peck reports a quiet interval- no major medical changes, she notes mild cough, small amt clear sput, notes that SOB is improved, uses Tramadol when her side hurts (she says from coughing) & this works well for her- wants refill;  On Medrol4mg  Qod and doesn't want to stop, NEBS w/ Albut+Ipratrop Tid, Advair500Bid, Singulair10, Flonase & Claritin as needed...  Care Everywhere  records reviewed>      She had another Breast Cancer Survivorship Clinic visit 08/10/17>  Note reviewed, no evid of recurrent dis, continue Tamoxifen, they addressed bone health, pt has declined f/u colonoscopy...    She was seen by PCP- [facility] on 10/20/17>  DM management (intol ACE1 & they rec ARB rx addition), HL management (statin rx rec);  immuniz record updated & vaccines rec...     She has up-coming medical visits at [facility] 11/13/17 (PCP), 01/04/18 (Cards), 03/15/18 (Oncology) EXAM shows Afeb, VSS, O2sat=98% on RA;  HEENT- neg, mallampati2;  Chest- clear w/o w/r/r;  Heart- RR w/o m/r/g;  Abd- soft, nontender, neg;  Ext- neg w/o c/c/e;  Neuro- intact...   CXR 11/13/17 (independently reviewed by me in the PACS system) showed norm heart size, clear lungs- NAD, post surg changes in left breast...  IMP/PLAN>>  Rachael Peck continues to do satis from the pulmonary standpoint; we had a long discussion about her meds and specifically the Medrol4mg  tabs=> rec to try to decr dose to 1/2 tab Qod, otherw continue the NEBS, Advair, Singuliair, etc...    ~  May 30, 2018:  7mo ROV & Rachael Peck reports another good interval- no new complaints or concerns;  She has been able to wean the Medrol 4mg  tabs down to 1/2 tab Qod but she notes that occas she needs to take 1 tab;  Breathing oerall is good but notes some difficulty in the hot/ humid air;  Notes min cough, occas small amt clear mucus, and no f/c/s or CP/ palpit/ dizzy/ edema;  She is exercising by walking some, maybe 2-3 x per week, limited by LBP, musc spasm she says (PCP added Celebrex and Flexeril)...  Care Everywhere records reviewed>      She saw PCP- JCooper,PA on 11/13/17 & 12/14/17>  Stable from her chr medical problems including: DM2, HBP, hypokalemia, DJD;  Labs showed BS=91, A1c=6.4, K=4.1, Cr=0.98, TG=198... subseq developed some LBP- XRays of Lspine showed mild DDD & facet arthropathy; treated w/ Celebrex & Flexeril...     She saw CARDS-  [facility]ersen [facility] on 01/04/18>  Hx HBP, palpit, ?PAF; notes rare self-lim palpit, BP well controlled, no CP etc;  rec to continue [place], she is off  HCT now...  We reviewed the following medical problems during today's office visit>      AR/ Asthma> on Medrol- weaned to 2mg Qod, Allegra/ Flonase, NEBS w/ Albut/Ipratrop Bid-Tid, Advair500Bid, Singulair10; ?gets meds in [place]? Prev asked to add Mucinex-2Bid + fluids; she freq runs out, compliance is poor, but symptoms reasonably well controlled w/ min cough, sputum, chr stable DOE, and no acute problems...    HBP> on Metop100, Amlod10-1/2 & K10-2Bid (off prev Hct12.5); BP= 140/80 & she denies CP, palpit, edema...    Overweight> "I'm watching my diet" and weight stable at 196# & BMI=30; we reviewed diet & exercise strategies...    GI> GERD, Divertics> on Protonix40 + Zantac300Qhs, & Bentyl prn; she has epig tender, distention, decr BS; she is followed by GI in [place] & we do not have records...    GU> Stress Incont> prev on Oxybut but off now; she states voiding is better & leaking less, doesn't want meds...    BREAST CANCER> Dx and treated at [facility] in 2014> Surgery, XRT, Oncology- on Tamoxifen now... NO OLD RECORDS AVAIL IN CareEverywhere?    TIA> on ASA81; followed at [facility], she denies cerebral ischemic symptoms...    Anxiety> on Trazodone50Qhs, Alprazolam0.25mg  prn; she is quite stressed & asked to take the Alpraz0.25 tid to help...  EXAM shows Afeb, VSS, O2sat=98% on RA;  HEENT- neg, mallampati2;  Chest- clear w/o w/r/r;  Heart- RR w/o m/r/g;  Abd- soft, nontender, neg;  Ext- neg w/o c/c/e;  Neuro- intact...   LABS reviewed in [place]... IMP/PLAN>>  Rachael Peck is stable- needs to take meds regularly & asked not to bump Pred on her own; similarly she is reminded of the need to slowly incr her exercise program & to continue trying to lose weight!  For now we will continue the Medrol4mg - 1/2 tab Qod + the Advair500Bid, Singulair10  daily, and the NEBS at least Bid;  She will maintain regularlf/u w/ her PCP, CARDS, ONCOLOGY- all at [facility], but she requests continued pulmonary follow up here in [place]- we discussed my up-coming retirement & we will set her up to see one of my young partners in early 2020...          Problem List:    ALLERGIC RHINITIS (ICD-477.9) - on ALLEGRA 180mg /d, & FLONASE Qhs...   CHRONIC OBSTRUCTIVE ASTHMA UNSPECIFIED (ICD-493.20) - on ADVAIR 250Bid (but not compliant),  PROAIR HFA Prn (using 2-3 times daily), ATROVENT 2spQid (started by the [facility] clinic in [place]), & MUCINEX 1-2Bid... ~  baseline CXR 11/08 showed sl hyperaeration, clear, NAD.Marland Kitchen. spondylitic changes in TSpine noted. ~  PFT 3/09 showed FVC= 3.36 (92%), FEV1= 2.13 (78%), %1sec= 63, mid-flows= 26% pred;  TLC= 6.61 (112%), RV= 3.25 (145%), RV/TLC= 49%;  DLCO= norm... ~  f/u CXR 8/10 showed min bibasilar atx, NAD... ~  PFT 2/11 showed FVC= 2.22 (73%), FEV1= 1.68 (70%), %1sec=76, mid-flows= 57%pred. ~  4/12:  67mo ROV here & she tells me she is getting her care thru the [facility] in [place]; just wants refills today- declines CXR, labs, etc; she hasn't been taking the Advair due to cost & we discussed regimen w/ ALBUTEROL 2spQid, ATROVENT 2spQid, FLOVENT 220- 2spBid, Singulair 10mg /d, Mucinex 2Bid, fluids, etc... ~  4/13:  Yearly ROV & she remains stable, same meds & followed in [place] as noted... ~  10/13:  Mild exac treated w/ her NEBS Qid, Advair250, Mucinex2Bid, Hycodan cough syrup... ~  7/14: on Allegra/ Flonase, NEBS w/ Albut/Ipratrop Qid, [id number] (  only uses samples due to $$) vs Flovent220-2slBid, Singulair10; ?gets meds in [place]? Asked to add Mucinex-2Bid + fluids. ~  4/15: presented w/ cough, yellow sput, incr SOB; CXR w/ RLL pneum; treated w/ Omnicef, Pred, Mucinex, etc & resolved... ~  5/15: follow up visit w/ resolution of pneumonia; encouraged to use meds regularly: on Allegra/ Flonase, NEBS w/ Albut/Ipratrop prn, Advair250Bid,  Singulair10... ~  11/15: f/u visit w/ prominent LPR symptoms, choking, etc; we reviewed antireflux regimen, continue Advair250, NEBS, Singulair10, etc and refer to [place] [facility] for further eval of her LPR/ choking episodes... ~  5/16: presents w/ AB exac and treated w/ Depo80, Pred dosepak, ZPak...  ~  CXR 5/16 showed norm heart size, clear lungs, NAD...  ~  Spirometry 5/16 showed FVC=2.16 (73%), FEV1=1.42 (63%), %1sec=66, mid-flows are reduced at 30% predicted; c/w mod airflow obstruction and GOLD Stage 2 COPD   FOLLOWED FOR PRIMARY CARE [facility] [place] [facility] >>   HYPERTENSION (ICD-401.9) - controlled on ATENOLOL100, AMLODIPINE10-1/2, HCTZ12.5, KCl 10-2Bid;  BP= 134/76, we do not have notes from her Primary...  CHEST PAIN, ATYPICAL (ICD-786.59) - on ASA 81mg /d... she denies recurrent CP, exertional CP, etc...  PALPITATIONS (ICD-785.1) - hx palpitations in the past... no recent symptoms on meds above & she knows to avoid caffeine, etc...  OVERWEIGHT (ICD-278.02) - we reviewed diet + exercise; weight = 205# w/ BMI= 31-2...  GERD (ICD-530.81) - on ZANTAC 300mg Qhs... she has a hx of GERD and LPR prev controlled on these meds but now symptoms have returned w/ prominent choking episodes & resp exac...   DIVERTICULOSIS OF COLON (ICD-562.10) - on Levsin prn; last colonoscopy 7/04 by DrJEdwards- showed divertics only; she now gets her [place] follow [place]...  BREAST CANCER >> she noted blood in her bra from nipple disch, [place] mammogram in [place] was neg but check up from her primary doctor ([facility] in [place]) lead to a bx from surg at [facility] w/ Dx of Breast Cancer> Surg- DrMcNutt (initial excision followed by 2nd surg for pos margins), XRT- DrBrown, Oncology- DrSwift now on Tamoxifen & doing very well by her report...  ~  See notes in Care Everywhere=> reviewed...  STRESS INCONTINENCE (ICD-788.39) - she claims leakage of urine and stool- she has been recommended to f/u w/ GYN for this  eval....  TIA >> on ASA 81mg /d & prev on Plavix as well...  ANXIETY (ICD-300.00) - states that she is under stress due to her condition and having to care for her mother (on hospice w/ renal failure)... Rx for ALPRAZOLAM 0.25mg  Tid Prn written...   Past Surgical History:  Procedure Laterality Date  . ABDOMINAL HYSTERECTOMY    . BREAST LUMPECTOMY  12/17/2012 & 01/14/2013    Outpatient Encounter Medications as of 05/30/2018  Medication Sig  . albuterol (PROVENTIL) (2.5 MG/3ML) 0.083% nebulizer solution Take 3 mLs (2.5 mg total) by nebulization 2 (two) times daily.  Marland Kitchen albuterol (VENTOLIN HFA) 108 (90 Base) MCG/ACT inhaler INHALE ONE TO TWO PUFFS INTO LUNGS EVERY 4 TO 6 HOURS AS NEEDED  . ALPRAZolam (XANAX) 0.25 MG tablet Take 1 tablet (0.25 mg total) by mouth 3 (three) times daily as needed.  Marland Kitchen amLODipine (NORVASC) 10 MG tablet Take 5 mg by mouth daily.   . ARTIFICIAL TEARS ophthalmic solution As needed for dry eyes   . aspirin 325 MG tablet Take 325 mg by mouth daily.  . celecoxib (CELEBREX) 200 MG capsule Take 200 mg by mouth 2 (two) times  daily.  . cholecalciferol (VITAMIN D) 1000 units tablet Take 1,000 Units by mouth daily.  . cyclobenzaprine (FLEXERIL) 5 MG tablet Take 5 mg by mouth 3 (three) times daily as needed for muscle spasms (2 to 3 times as needed).  Marland Kitchen dicyclomine (BENTYL) 20 MG tablet TAKE 1 TABLET THREE TIMES DAILY AS NEEDED FOR SPASMS.  . fexofenadine (ALLEGRA) 180 MG tablet Take 1 tablet (180 mg total) by mouth daily. Once a day as needed for allergies  . fluticasone (FLONASE) 50 MCG/ACT nasal spray USE TWO SPRAYS IN EACH NOSTRIL ONE TIME DAILY  . HYDROcodone-homatropine (HYCODAN) 5-1.5 MG/5ML syrup Take 5 mLs by mouth every 6 (six) hours as needed for cough.  . hyoscyamine (LEVSIN) 0.125 MG tablet Take 1 tablet (0.125 mg total) by mouth 3 (three) times daily as needed for cramping.  Marland Kitchen ipratropium (ATROVENT) 0.02 % nebulizer solution INHALE THE CONTENTS OF 1 VIAL VIA NEBULIZER  THREE TIMES DAILY  . KLOR-CON SPRINKLE 10 MEQ CR capsule TAKE 2 CAPSULES TWICE DAILY  . loratadine (CLARITIN) 10 MG tablet Take 10 mg by mouth daily as needed.  . meclizine (ANTIVERT) 25 MG tablet TAKE ONE TABLET BY MOUTH THREE TIMES DAILY AS NEEDED FOR DIZZINESS OR NAUSEA.  . methylPREDNISolone (MEDROL) 4 MG tablet Take daily as directed  . metoprolol (LOPRESSOR) 100 MG tablet Take 100 mg by mouth daily.  . montelukast (SINGULAIR) 10 MG tablet TAKE 1 TABLET AT BEDTIME  . naproxen (NAPROSYN) 500 MG tablet Take 500 mg by mouth 3 (three) times daily with meals.  . pantoprazole (PROTONIX) 40 MG tablet TAKE 1 TABLET DAILY  30 MINUTES PRIOR TO BREAKFAST  . ranitidine (ZANTAC) 300 MG tablet TAKE 1 TABLET AT BEDTIME  . traMADol (ULTRAM) 50 MG tablet Take 1 tablet (50 mg total) by mouth 3 (three) times daily as needed.  . traZODone (DESYREL) 50 MG tablet Take 50 mg by mouth at bedtime. For sleep   . vitamin B-12 (CYANOCOBALAMIN) 1000 MCG tablet Take 1,000 mcg by mouth daily.  . [DISCONTINUED] albuterol (PROVENTIL) (2.5 MG/3ML) 0.083% nebulizer solution Take 3 mLs (2.5 mg total) by nebulization 2 (two) times daily.  . [DISCONTINUED] albuterol (VENTOLIN HFA) 108 (90 Base) MCG/ACT inhaler INHALE ONE TO TWO PUFFS INTO LUNGS EVERY 4 TO 6 HOURS AS NEEDED  . [DISCONTINUED] fluticasone (FLONASE) 50 MCG/ACT nasal spray USE TWO SPRAYS IN EACH NOSTRIL ONE TIME DAILY  . [DISCONTINUED] hydrochlorothiazide (HYDRODIURIL) 12.5 MG tablet Take 12.5 mg by mouth daily.  . [DISCONTINUED] ipratropium (ATROVENT) 0.02 % nebulizer solution INHALE THE CONTENTS OF 1 VIAL VIA NEBULIZER THREE TIMES DAILY  . [DISCONTINUED] methylPREDNISolone (MEDROL) 4 MG tablet Take 1 tablet (4 mg total) by mouth every other day.  . [DISCONTINUED] tamoxifen (NOLVADEX) 20 MG tablet Take 20 mg by mouth daily.  . Fluticasone-Salmeterol (ADVAIR DISKUS) 500-50 MCG/DOSE AEPB Inhale 1 puff into the lungs 2 (two) times daily for 6 days.   No  facility-administered encounter medications on file as of 05/30/2018.     No Known Allergies    Immunization History  Administered Date(s) Administered  . Influenza Split 08/14/2011, 07/11/2012, 07/16/2013, 07/24/2014, 07/16/2017  . Influenza, High Dose Seasonal PF 07/30/2015, 07/18/2016  . Influenza-Unspecified 06/30/2015  . Pneumococcal Conjugate-13 01/12/2011  . Pneumococcal Polysaccharide-23 07/16/2013    Current Medications, Allergies, Past Medical History, Past Surgical History, Family History, and Social History were reviewed in [employer] record.   Review of Systems         See HPI -  all other systems neg except as noted... The patient complains of dyspnea on exertion, incontinence, and difficulty walking.  The patient denies anorexia, fever, weight loss, weight gain, vision loss, decreased hearing, hoarseness, chest pain, syncope, peripheral edema, prolonged cough, headaches, hemoptysis, abdominal pain, melena, hematochezia, severe indigestion/heartburn, hematuria, muscle weakness, suspicious skin lesions, transient blindness, depression, unusual weight change, abnormal bleeding, enlarged lymph nodes, and angioedema.     Objective:   Physical Exam     WD, Overweight, 72 y/o BF in NAD... GENERAL:  Alert & oriented; pleasant & cooperative...  HEENT:  [place]/AT, EOM-wnl, PERRLA, EACs-clear, TMs-wnl, NOSE-clear, THROAT-clear & wnl. NECK:  Supple w/ fairROM; no JVD; normal carotid impulses w/o bruits; no thyromegaly or nodules palpated; no lymphadenopathy. CHEST:  decr BS bilat but clear- no wheezing/ rales/ rhonchi/ or signs of consolidation... HEART:  Regular Rhythm; without murmurs/ rubs/ or gallops detected... ABDOMEN:  Obese, sl distended, decr BS, & tender epig; no organomegaly or masses palpated... EXT: without deformities, mild arthritic changes; no varicose veins/ +venous insuffic/ no edema. NEURO:  CN's intact;  no focal neuro deficits... DERM:  No  lesions noted; no rash etc...  RADIOLOGY DATA:  Reviewed in the EPIC EMR & discussed w/ the patient...  LABORATORY DATA:  Reviewed in the EPIC EMR & discussed w/ the patient...   ASSESSMENT & PLAN:    AR & ASTHMA>  Her chronic obstructive asthma has been better regulated w/ meds from [facility] [place] [facility] w/ their samples etc; compliance has been an issue in the past; now supposed to be on Advair500Bid, NEBS w/ Albut & Ipratrop Tid, Singulair10, Hycodan prn; she has required occas rounds of antibiotics and Pred in the past so we will remind her to use the NEBULIZER w/ both meds Tid regularly + the Advair500Bid...  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  ADDENDUM>> with her norm IgE/RAST, and Eos~300 she would be a candidate for [facility] & we will consider this in follow up... 11/29/16>   The FeNO result is very surprising as she has been on the MEDROL 4mg  daily for ~14mo now AND she feels much better c/w a steroid benefit to her airways dis; we reviewed her prev lab results w/ norm IgE & RAST, and CBC w/ 6.6% eos before the Medrol- she is a candidate for Nucala/ Fasenra but declines the biologic & prefers to continue Medrol slow taper given her clinical response; in this regard we will decr her Medrol4mg  to 1 tab alternate w/ 1/2 tab for Feb & cut further to 4mg  Qod starting in March w/ ROV recheck in 83mo. 02/07/17>   We decided to refill her Medrol 4mg  one tab QOD & encourage compliance w/ Advair500Bid, NEBS w/ albut/ipratrop, Singulair10, etc... We will plan rov recheck in 72mo... 05/09/17>   Rachael Peck is stable, we discussed continuing the same meds for now & call for any  interval problems...  11/09/17>   Rachael Peck continues to do satis from the pulmonary standpoint; we had a long discussion about her meds and specifically the Medrol4mg  tabs=> rec to try to decr dose to 1/2 tab Qod, otherw  continue the NEBS, Advair, Singuliair, etc.   GI> Hx GERD, Divertics> more abd findings than chest findings today- hx diarrhea, distended, decr BS, epig tender; we will add PPI- Protonix40, continue Zantac300, add Align to her Activia 7 plan f/u appt w/ GI- [place]Conway [place]...  << Medical problems followed by her primary care team & specialists at [facility] >>  HBP>   AtypCP & Palpit>  Breast Cancer>   Stress Incont>  TIA>   Anxiety>  She needs to take the Alpraz0.25 tid as discussed...   Patient's Medications  New Prescriptions   No medications on file  Previous Medications   ALPRAZOLAM (XANAX) 0.25 MG TABLET    Take 1 tablet (0.25 mg total) by mouth 3 (three) times daily as needed.   AMLODIPINE (NORVASC) 10 MG TABLET    Take 5 mg by mouth daily.    ARTIFICIAL TEARS OPHTHALMIC SOLUTION    As needed for dry eyes    ASPIRIN 325 MG TABLET    Take 325 mg by mouth daily.   CELECOXIB (CELEBREX) 200 MG CAPSULE    Take 200 mg by mouth 2 (two) times daily.   CHOLECALCIFEROL (VITAMIN D) 1000 UNITS TABLET    Take 1,000 Units by mouth daily.   CYCLOBENZAPRINE (FLEXERIL) 5 MG TABLET    Take 5 mg by mouth 3 (three) times daily as needed for muscle spasms (2 to 3 times as needed).   DICYCLOMINE (BENTYL) 20 MG TABLET    TAKE 1 TABLET THREE TIMES DAILY AS NEEDED FOR SPASMS.   FEXOFENADINE (ALLEGRA) 180 MG TABLET    Take 1 tablet (180 mg total) by mouth daily. Once a day as needed for allergies   FLUTICASONE-SALMETEROL (ADVAIR DISKUS) 500-50 MCG/DOSE AEPB    Inhale 1 puff into the lungs 2 (two) times daily for 6 days.   HYDROCODONE-HOMATROPINE (HYCODAN) 5-1.5 MG/5ML SYRUP    Take 5 mLs by mouth every 6 (six) hours as needed for cough.   HYOSCYAMINE (LEVSIN) 0.125 MG TABLET    Take 1 tablet (0.125  mg total) by mouth 3 (three) times daily as needed for cramping.   KLOR-CON SPRINKLE 10 MEQ CR CAPSULE    TAKE 2 CAPSULES TWICE DAILY   LORATADINE (CLARITIN) 10 MG TABLET    Take 10 mg by mouth daily as needed.   MECLIZINE (ANTIVERT) 25 MG TABLET    TAKE ONE TABLET BY MOUTH THREE TIMES DAILY AS NEEDED FOR DIZZINESS OR NAUSEA.   METOPROLOL (LOPRESSOR) 100 MG TABLET    Take 100 mg by mouth daily.   MONTELUKAST (SINGULAIR) 10 MG TABLET    TAKE 1 TABLET AT BEDTIME   NAPROXEN (NAPROSYN) 500 MG TABLET    Take 500 mg by mouth 3 (three) times daily with meals.   PANTOPRAZOLE (PROTONIX) 40 MG TABLET    TAKE 1 TABLET DAILY  30 MINUTES PRIOR TO BREAKFAST   RANITIDINE (ZANTAC) 300 MG TABLET    TAKE 1 TABLET AT BEDTIME   TRAMADOL (ULTRAM) 50 MG TABLET    Take 1 tablet (50 mg total) by mouth 3 (three) times daily as needed.   TRAZODONE (DESYREL) 50 MG TABLET    Take 50 mg by mouth at bedtime. For sleep    VITAMIN B-12 (CYANOCOBALAMIN) 1000 MCG TABLET    Take 1,000 mcg by mouth daily.  Modified Medications   Modified Medication Previous Medication   ALBUTEROL (PROVENTIL) (2.5 MG/3ML) 0.083% NEBULIZER SOLUTION albuterol (PROVENTIL) (2.5 MG/3ML) 0.083% nebulizer solution  Take 3 mLs (2.5 mg total) by nebulization 2 (two) times daily.    Take 3 mLs (2.5 mg total) by nebulization 2 (two) times daily.   ALBUTEROL (VENTOLIN HFA) 108 (90 BASE) MCG/ACT INHALER albuterol (VENTOLIN HFA) 108 (90 Base) MCG/ACT inhaler      INHALE ONE TO TWO PUFFS INTO LUNGS EVERY 4 TO 6 HOURS AS NEEDED    INHALE ONE TO TWO PUFFS INTO LUNGS EVERY 4 TO 6 HOURS AS NEEDED   FLUTICASONE (FLONASE) 50 MCG/ACT NASAL SPRAY fluticasone (FLONASE) 50 MCG/ACT nasal spray      USE TWO SPRAYS IN EACH NOSTRIL ONE TIME DAILY    USE TWO SPRAYS IN EACH NOSTRIL ONE TIME DAILY   IPRATROPIUM (ATROVENT) 0.02 % NEBULIZER SOLUTION ipratropium (ATROVENT) 0.02 % nebulizer solution      INHALE THE CONTENTS OF 1 VIAL VIA NEBULIZER THREE TIMES DAILY    INHALE THE  CONTENTS OF 1 VIAL VIA NEBULIZER THREE TIMES DAILY   METHYLPREDNISOLONE (MEDROL) 4 MG TABLET methylPREDNISolone (MEDROL) 4 MG tablet      Take daily as directed    Take 1 tablet (4 mg total) by mouth every other day.  Discontinued Medications   HYDROCHLOROTHIAZIDE (HYDRODIURIL) 12.5 MG TABLET    Take 12.5 mg by mouth daily.   TAMOXIFEN (NOLVADEX) 20 MG TABLET    Take 20 mg by mouth daily.

## 2018-05-30 NOTE — Patient Instructions (Signed)
Today we updated your med list in our EPIC system...    Continue your current medications the same...  Continue the Medrol 4mg  tabs taking 1/2 tab every other day as you have been doing...  Continue your NEBULIZER, ADVAIR, & Singulair the same...  Stay as active as poss - walikng, yoga, etc...  Call for any questions or if I can be of service in any way...  We will arrange for a follow up visit here in the clinic in 66months.Marland KitchenMarland Kitchen

## 2018-09-02 IMAGING — DX DG CHEST 2V
2 series · 2 of 2 positions shown · non-contrast
Comparison: 03/29/2016

CLINICAL DATA: Chronic cough, COPD

EXAM:
CHEST  2 VIEW

[chest pa]
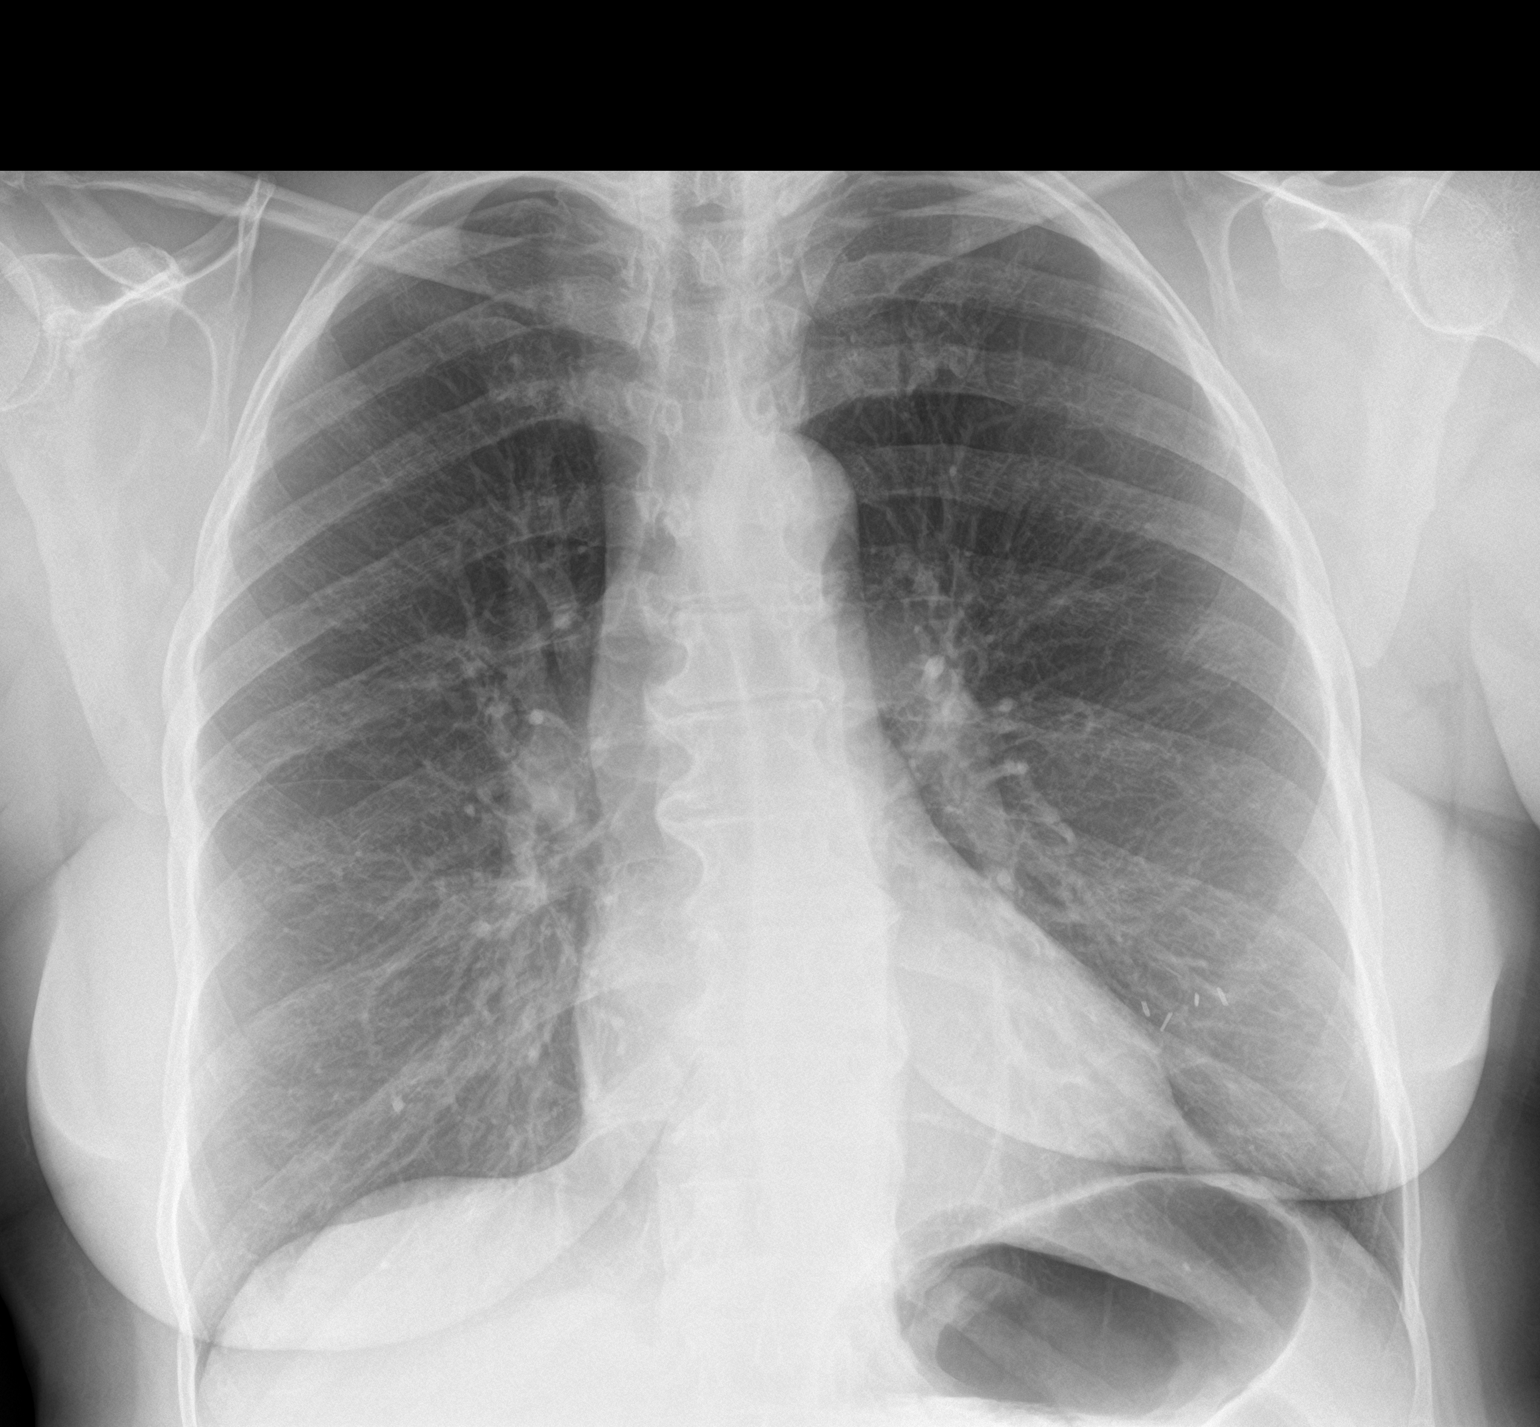

[chest lat]
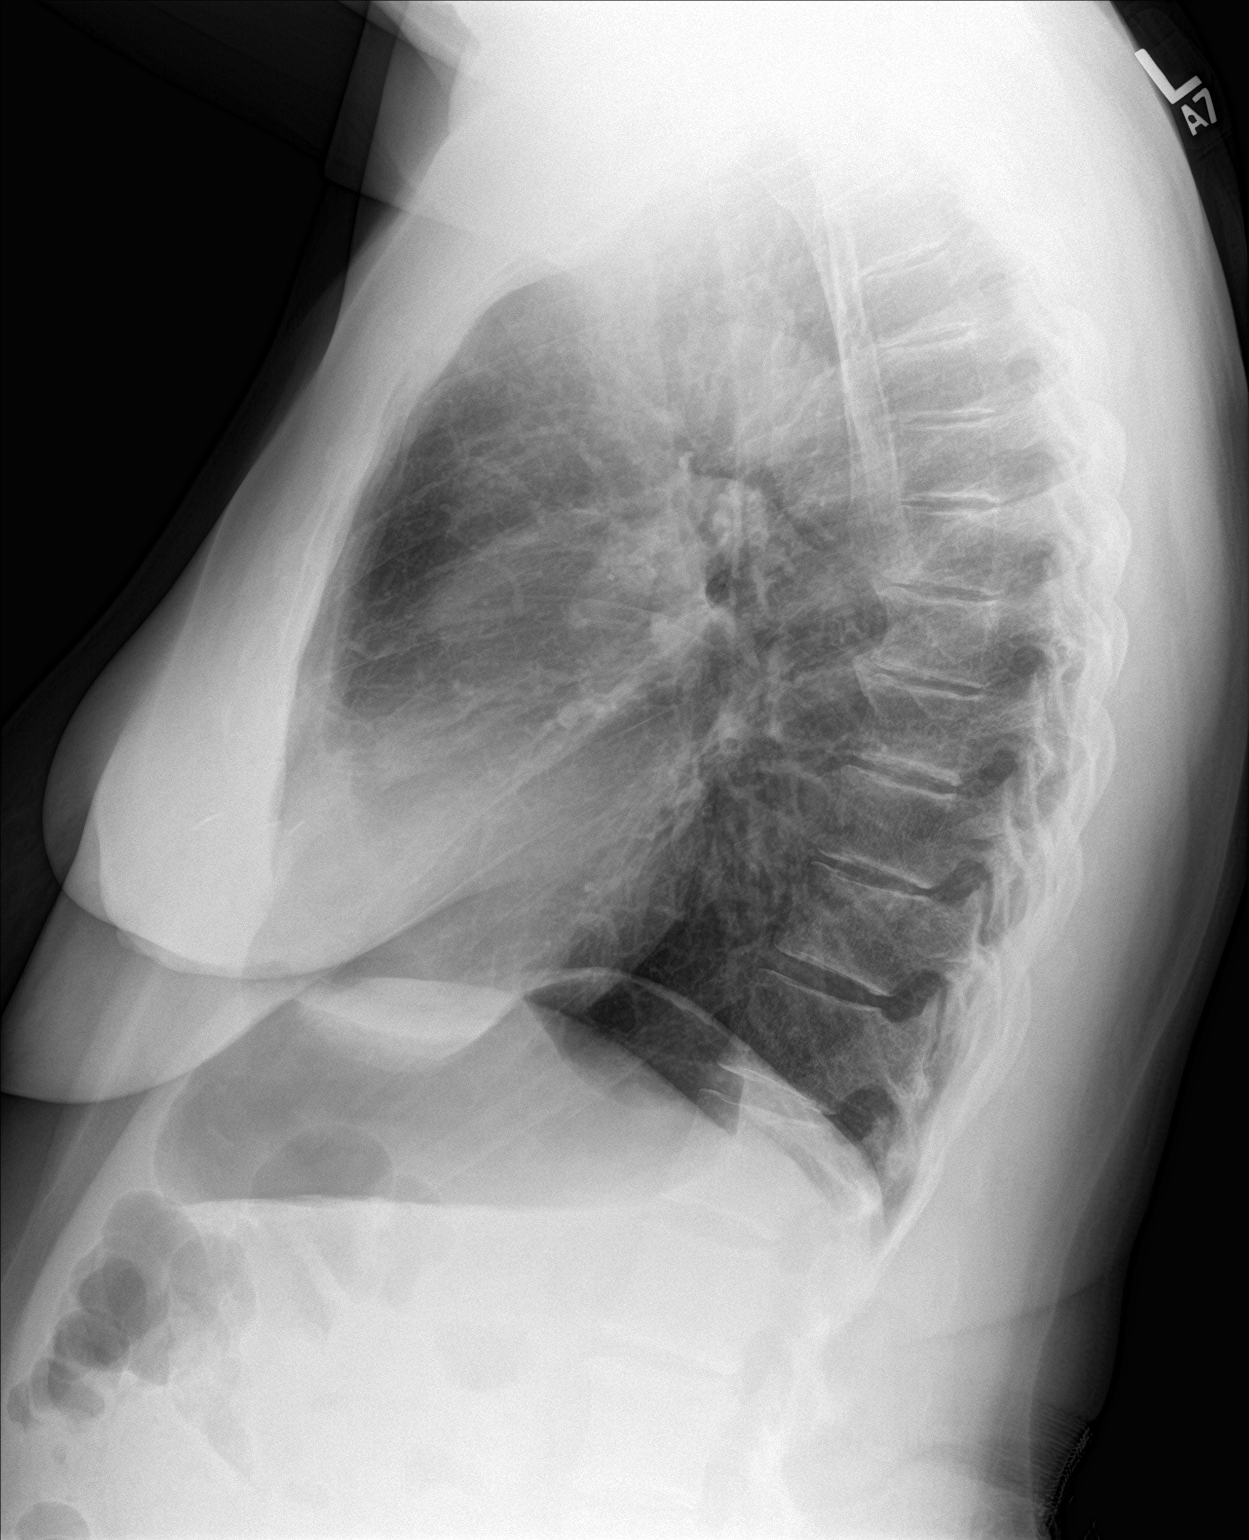

[2 of 2 positions shown; findings below may reference images not displayed]

FINDINGS: The heart size and mediastinal contours are within normal limits.
Both lungs are hyperinflated. The visualized skeletal structures are
unremarkable. Postsurgical changes in the left breast are again
seen.
IMPRESSION: No acute abnormality noted.

## 2018-09-26 ENCOUNTER — Other Ambulatory Visit: Payer: Self-pay | Admitting: Pulmonary Disease

## 2018-11-16 ENCOUNTER — Other Ambulatory Visit: Payer: Self-pay | Admitting: Pulmonary Disease

## 2018-11-20 ENCOUNTER — Other Ambulatory Visit: Payer: Self-pay | Admitting: Pulmonary Disease

## 2018-11-20 DIAGNOSIS — J449 Chronic obstructive pulmonary disease, unspecified: Secondary | ICD-10-CM

## 2018-11-23 ENCOUNTER — Telehealth: Payer: Self-pay | Admitting: Pulmonary Disease

## 2018-11-23 DIAGNOSIS — J449 Chronic obstructive pulmonary disease, unspecified: Secondary | ICD-10-CM

## 2018-11-23 MED ORDER — METHYLPREDNISOLONE 4 MG PO TABS: ORAL_TABLET | ORAL | 0 refills | Status: DC

## 2018-11-23 NOTE — Telephone Encounter (Signed)
Spoke with pt.  Pt stated she had to up her dose to 1 pill a day of methyprednisolone while she was sick.  She only had a few left.  30 tablets of methylprednisilone sent to [facility] per Lattie Haw T. Under Dr. Lenna Gilford until pt's OV on 11/29/18.  Nothing further needed.

## 2018-11-23 NOTE — Telephone Encounter (Signed)
Pt is calling back  [phone] 

## 2018-11-23 NOTE — Telephone Encounter (Signed)
Called patient, unable to reach left message to give us a call back. 

## 2018-11-29 ENCOUNTER — Ambulatory Visit: Payer: Medicare HMO | Admitting: Pulmonary Disease

## 2018-11-30 ENCOUNTER — Telehealth: Payer: Self-pay | Admitting: Pulmonary Disease

## 2018-11-30 DIAGNOSIS — J449 Chronic obstructive pulmonary disease, unspecified: Secondary | ICD-10-CM

## 2018-11-30 MED ORDER — ALBUTEROL SULFATE (2.5 MG/3ML) 0.083% IN NEBU: 2.5000 mg | INHALATION_SOLUTION | Freq: Two times a day (BID) | RESPIRATORY_TRACT | 1 refills | Status: DC

## 2018-11-30 MED ORDER — METHYLPREDNISOLONE 4 MG PO TABS: ORAL_TABLET | ORAL | 0 refills | Status: DC

## 2018-11-30 NOTE — Telephone Encounter (Signed)
Called patient to get her rescheduled since BI would not be in the office on 12/06/18. She has been scheduled to see BI on 12/19/18 at 315 since that was his first opening. She is aware of appt.   While on the phone, patient stated that she needed refills on her methylprednisolone 4mg , albuterol neb solution and inhaler to last until her appt. Advised her that I would send in refills for her. She verbalized understanding. Nothing further needed at time of call.

## 2018-12-03 ENCOUNTER — Other Ambulatory Visit: Payer: Self-pay | Admitting: Pulmonary Disease

## 2018-12-06 ENCOUNTER — Ambulatory Visit: Payer: Medicare HMO | Admitting: Pulmonary Disease

## 2018-12-19 ENCOUNTER — Ambulatory Visit: Payer: Medicare HMO | Admitting: Pulmonary Disease

## 2019-01-09 ENCOUNTER — Other Ambulatory Visit: Payer: Self-pay | Admitting: Pulmonary Disease

## 2019-01-09 DIAGNOSIS — J449 Chronic obstructive pulmonary disease, unspecified: Secondary | ICD-10-CM

## 2019-01-10 NOTE — Telephone Encounter (Signed)
BI please advise if this is ok to refill. Last refilled by SN

## 2019-01-12 NOTE — Telephone Encounter (Signed)
PCCM: Please schedule a telemedicine e-visit with one of the APPs prior to refill Garner Nash, DO [facility] 01/12/2019 8:07 AM

## 2019-01-14 ENCOUNTER — Telehealth: Payer: Self-pay | Admitting: Pulmonary Disease

## 2019-01-14 DIAGNOSIS — J449 Chronic obstructive pulmonary disease, unspecified: Secondary | ICD-10-CM

## 2019-01-14 MED ORDER — METHYLPREDNISOLONE 4 MG PO TABS: ORAL_TABLET | ORAL | 0 refills | Status: DC

## 2019-01-14 NOTE — Telephone Encounter (Signed)
Pt is calling back  [phone]

## 2019-01-14 NOTE — Telephone Encounter (Signed)
Patient is aware and nothing further needed televisit has been made.

## 2019-01-14 NOTE — Telephone Encounter (Signed)
LMTCB

## 2019-01-14 NOTE — Telephone Encounter (Signed)
Per last Dr. Lenna Gilford note:  Medrol4mg  tabs=> rec to try to decr dose to 1/2 tab Qod  You can send in this prescription for a 30-day prescription.  Patient needs a tele-visit with an app for further evaluation.  She is unable to have her appointment that she had plan schedule with Dr. Valeta Harms.  During this tele-visit they can further address the need for chronic steroid management.  Wyn Quaker FNP

## 2019-01-14 NOTE — Telephone Encounter (Signed)
Patient is requesting refill on prednisone. Her apt with Dr. Valeta Harms was moved due to provider  Being moved to the hospital.  Former SN patient. Last saw 05/30/18.  BM please advise if you are ok with refilling this medication.

## 2019-01-21 ENCOUNTER — Ambulatory Visit (INDEPENDENT_AMBULATORY_CARE_PROVIDER_SITE_OTHER): Payer: Medicare HMO | Admitting: Primary Care

## 2019-01-21 ENCOUNTER — Other Ambulatory Visit: Payer: Self-pay

## 2019-01-21 ENCOUNTER — Encounter: Payer: Self-pay | Admitting: Primary Care

## 2019-01-21 DIAGNOSIS — J449 Chronic obstructive pulmonary disease, unspecified: Secondary | ICD-10-CM | POA: Diagnosis not present

## 2019-01-21 MED ORDER — FAMOTIDINE 20 MG PO TABS: 20.0000 mg | ORAL_TABLET | Freq: Every day | ORAL | 3 refills | Status: DC

## 2019-01-21 MED ORDER — IPRATROPIUM BROMIDE 0.02 % IN SOLN: RESPIRATORY_TRACT | 3 refills | Status: DC

## 2019-01-21 MED ORDER — FLUTICASONE-SALMETEROL 500-50 MCG/DOSE IN AEPB: 1.0000 | INHALATION_SPRAY | Freq: Two times a day (BID) | RESPIRATORY_TRACT | 3 refills | Status: DC

## 2019-01-21 MED ORDER — METHYLPREDNISOLONE 4 MG PO TABS: ORAL_TABLET | ORAL | 3 refills | Status: DC

## 2019-01-21 MED ORDER — FLUTICASONE PROPIONATE 50 MCG/ACT NA SUSP: 2.0000 | Freq: Every day | NASAL | 3 refills | Status: DC

## 2019-01-21 NOTE — Progress Notes (Signed)
Virtual Visit via Telephone Note  I connected with Rachael Peck on 01/21/19 at  1:30 PM EDT by telephone and verified that I am speaking with the correct person using two identifiers.   I discussed the limitations, risks, security and privacy concerns of performing an evaluation and management service by telephone and the availability of in person appointments. I also discussed with the patient that there may be a patient responsible charge related to this service. The patient expressed understanding and agreed to proceed.   History of Present Illness: 73 year old female, never smoked. PMH significant for chronic obstructive airway disease with asthma, hypertension, allergic rhinitis, LPR, GERD. Former patient of Dr. Lenna Gilford, she was supposed to see Dr. Valeta Harms but that appointment has needed to be rescheduled twice.    01/21/2019 Patient called on 3/30 for refill of her methylprednisolone 4mg . Filled by on call NP and encouraged follow up with E-Visit for chronic steroid management. Patient has a history of chronic obstructive asthma; continues Adviar 500 twice daily, duonebs and singulair. Per Dr. Jeannine Kitten last note he recommended patient try to decreased dose to 1/2 tab every other day.   States that she can not afford Advair, asking about generic Wixela. She has been off her maintenance inhaler since December 2019. States that she has been in distress more lately, likely because she has not been using her Advair. She is taking Medrol 4mg  1/2 every other day. States that if she starts to get into distress Dr. Lenna Gilford would allow her to take 1 pill QOD. States that when she has her medications straight she has a good qualify of life.  PFTs 2017- ratio 66 , FEV1 64%  Observations/Objective:  - No shortness of breath or wheezing   Assessment and Plan:  Obstructive asthma - Take 1/2-1 tab Medrol 4mg  every other day (stay on the least amount to control your symptoms) - Stop Advair - Start WIXELA  1 puff q 12hrs  - Continue nebs as previously prescribed  Allergic rhinitis - Continue Singulair and Flonase nasal spray   GERD - RX Famotidine(pepcid) 20mg  daily (replacing Zantac)   Follow Up Instructions:  - Follow up in 3-4 months with Dr. Valeta Harms   I discussed the assessment and treatment plan with the patient. The patient was provided an opportunity to ask questions and all were answered. The patient agreed with the plan and demonstrated an understanding of the instructions.   The patient was advised to call back or seek an in-person evaluation if the symptoms worsen or if the condition fails to improve as anticipated.  I provided 25 minutes of non-face-to-face time during this encounter.   Martyn Ehrich, NP

## 2019-01-21 NOTE — Patient Instructions (Addendum)
Obstructive asthma - Take 1/2-1 tab Medrol 4mg  every other day (stay on the least amount to control your symptoms) - Start Wixela 1 puffs twice daily (this replaced Advair) - Continue nebs as previously prescribed  - Continue Singulair  Follow up 3 months with Dr. Valeta Harms   Coronavirus (COVID-19) Are you at risk?  Are you at risk for the Coronavirus (COVID-19)?  To be considered HIGH RISK for Coronavirus (COVID-19), you have to meet the following criteria:  . Traveled to [place], [place], [place], [place] or [place]; or in the [place] to [place], [place], [place], or [place]; and have fever, cough, and shortness of breath within the last 2 weeks of travel OR . Been in close contact with a person diagnosed with COVID-19 within the last 2 weeks and have fever, cough, and shortness of breath . IF YOU DO NOT MEET THESE CRITERIA, YOU ARE CONSIDERED LOW RISK FOR COVID-19.  What to do if you are HIGH RISK for COVID-19?  Marland Kitchen If you are having a medical emergency, call 911. . Seek medical care right away. Before you go to a doctor's office, urgent care or emergency department, call ahead and tell them about your recent travel, contact with someone diagnosed with COVID-19, and your symptoms. You should receive instructions from your physician's office regarding next steps of care.  . When you arrive at healthcare provider, tell the healthcare staff immediately you have returned from visiting [place], [place], [place], [place] or [place]; or traveled in the [place] to [place], [place], [place], or [place]; in the last two weeks or you have been in close contact with a person diagnosed with COVID-19 in the last 2 weeks.   . Tell the health care staff about your symptoms: fever, cough and shortness of breath. . After you have been seen by a medical provider, you will be either: o Tested for (COVID-19) and discharged home on quarantine except to seek medical care if symptoms worsen,  and asked to  - Stay home and avoid contact with others until you get your results (4-5 days)  - Avoid travel on public transportation if possible (such as bus, train, or airplane) or o Sent to the Emergency Department by EMS for evaluation, COVID-19 testing, and possible admission depending on your condition and test results.  What to do if you are LOW RISK for COVID-19?  Reduce your risk of any infection by using the same precautions used for avoiding the common cold or flu:  Marland Kitchen Wash your hands often with soap and warm water for at least 20 seconds.  If soap and water are not readily available, use an alcohol-based hand sanitizer with at least 60% alcohol.  . If coughing or sneezing, cover your mouth and nose by coughing or sneezing into the elbow areas of your shirt or coat, into a tissue or into your sleeve (not your hands). . Avoid shaking hands with others and consider head nods or verbal greetings only. . Avoid touching your eyes, nose, or mouth with unwashed hands.  . Avoid close contact with people who are sick. . Avoid places or events with large numbers of people in one location, like concerts or sporting events. . Carefully consider travel plans you have or are making. . If you are planning any travel outside or inside the [place], visit the CDC's Travelers' Health webpage for the latest health notices. . If you have some symptoms but not all symptoms, continue to monitor  at home and seek medical attention if your symptoms worsen. . If you are having a medical emergency, call 911.   [place] / e-Visit: [email or web address]         [facility]: [facility]: [phone]                   [facility]: [phone]

## 2019-01-21 NOTE — Progress Notes (Signed)
PCCM: I will be happy to establish care Garner Nash, DO [facility] 01/21/2019 3:27 PM

## 2019-04-02 ENCOUNTER — Other Ambulatory Visit: Payer: Self-pay

## 2019-04-02 MED ORDER — ALBUTEROL SULFATE HFA 108 (90 BASE) MCG/ACT IN AERS: INHALATION_SPRAY | RESPIRATORY_TRACT | 1 refills | Status: DC

## 2019-04-10 ENCOUNTER — Other Ambulatory Visit: Payer: Self-pay

## 2019-04-10 MED ORDER — ALBUTEROL SULFATE HFA 108 (90 BASE) MCG/ACT IN AERS: INHALATION_SPRAY | RESPIRATORY_TRACT | 1 refills | Status: DC

## 2019-05-29 ENCOUNTER — Other Ambulatory Visit: Payer: Self-pay | Admitting: Pulmonary Disease

## 2019-08-01 ENCOUNTER — Telehealth: Payer: Self-pay | Admitting: Pulmonary Disease

## 2019-08-01 MED ORDER — METHYLPREDNISOLONE 4 MG PO TABS: ORAL_TABLET | ORAL | 0 refills | Status: DC

## 2019-08-01 MED ORDER — ALBUTEROL SULFATE HFA 108 (90 BASE) MCG/ACT IN AERS: INHALATION_SPRAY | RESPIRATORY_TRACT | 0 refills | Status: DC

## 2019-08-01 MED ORDER — FLUTICASONE-SALMETEROL 500-50 MCG/DOSE IN AEPB: 1.0000 | INHALATION_SPRAY | Freq: Two times a day (BID) | RESPIRATORY_TRACT | 0 refills | Status: DC

## 2019-08-01 MED ORDER — MONTELUKAST SODIUM 10 MG PO TABS: 10.0000 mg | ORAL_TABLET | Freq: Every day | ORAL | 0 refills | Status: DC

## 2019-08-01 NOTE — Telephone Encounter (Signed)
Spoke with patient. She stated that she needed refills on Singulair, Medrol 4mg , Wixela and albuterol inhalers. She stated that [facility] had contacted our office for the refills but had not heard anything back from [place]. Advised her that normally [facility] sends over electronic refills but I did not see any in her chart.   Sent in a 3 month supply. Advised patient after 3 months, she will need to have an OV with Dr. Valeta Harms for refills. She verbalized understanding. Recall has been placed for January 2021.   Nothing further needed at time of call.

## 2019-08-05 ENCOUNTER — Other Ambulatory Visit: Payer: Self-pay

## 2019-08-05 MED ORDER — ALBUTEROL SULFATE HFA 108 (90 BASE) MCG/ACT IN AERS: INHALATION_SPRAY | RESPIRATORY_TRACT | 5 refills | Status: DC

## 2019-08-09 ENCOUNTER — Other Ambulatory Visit: Payer: Self-pay

## 2019-10-23 ENCOUNTER — Other Ambulatory Visit: Payer: Self-pay | Admitting: Primary Care

## 2019-10-28 ENCOUNTER — Other Ambulatory Visit: Payer: Self-pay | Admitting: Emergency Medicine

## 2019-10-28 MED ORDER — MONTELUKAST SODIUM 10 MG PO TABS: 10.0000 mg | ORAL_TABLET | Freq: Every day | ORAL | 1 refills | Status: DC

## 2019-11-13 ENCOUNTER — Ambulatory Visit: Payer: Medicare HMO | Admitting: Pulmonary Disease

## 2019-11-13 ENCOUNTER — Encounter: Payer: Self-pay | Admitting: Pulmonary Disease

## 2019-11-13 ENCOUNTER — Other Ambulatory Visit: Payer: Self-pay

## 2019-11-13 VITALS — BP 120/70 | HR 101 | Ht 67.0 in | Wt 201.4 lb

## 2019-11-13 DIAGNOSIS — F1920 Other psychoactive substance dependence, uncomplicated: Secondary | ICD-10-CM

## 2019-11-13 DIAGNOSIS — J309 Allergic rhinitis, unspecified: Secondary | ICD-10-CM

## 2019-11-13 DIAGNOSIS — J449 Chronic obstructive pulmonary disease, unspecified: Secondary | ICD-10-CM | POA: Diagnosis not present

## 2019-11-13 DIAGNOSIS — J4550 Severe persistent asthma, uncomplicated: Secondary | ICD-10-CM | POA: Diagnosis not present

## 2019-11-13 DIAGNOSIS — Z7689 Persons encountering health services in other specified circumstances: Secondary | ICD-10-CM | POA: Diagnosis not present

## 2019-11-13 LAB — CBC WITH DIFFERENTIAL/PLATELET
Basophils Absolute: 0 10*3/uL (ref 0.0–0.1)
Basophils Relative: 0.3 % (ref 0.0–3.0)
Eosinophils Absolute: 0.1 10*3/uL (ref 0.0–0.7)
Eosinophils Relative: 1.3 % (ref 0.0–5.0)
HCT: 34.9 % — ABNORMAL LOW (ref 36.0–46.0)
Hemoglobin: 11.4 g/dL — ABNORMAL LOW (ref 12.0–15.0)
Lymphocytes Relative: 10.3 % — ABNORMAL LOW (ref 12.0–46.0)
Lymphs Abs: 0.8 10*3/uL (ref 0.7–4.0)
MCHC: 32.6 g/dL (ref 30.0–36.0)
MCV: 87 fl (ref 78.0–100.0)
Monocytes Absolute: 1.1 10*3/uL — ABNORMAL HIGH (ref 0.1–1.0)
Monocytes Relative: 13.4 % — ABNORMAL HIGH (ref 3.0–12.0)
Neutro Abs: 6 10*3/uL (ref 1.4–7.7)
Neutrophils Relative %: 74.7 % (ref 43.0–77.0)
Platelets: 259 10*3/uL (ref 150.0–400.0)
RBC: 4.01 Mil/uL (ref 3.87–5.11)
RDW: 15 % (ref 11.5–15.5)
WBC: 8 10*3/uL (ref 4.0–10.5)

## 2019-11-13 MED ORDER — MONTELUKAST SODIUM 10 MG PO TABS: 10.0000 mg | ORAL_TABLET | Freq: Every day | ORAL | 3 refills | Status: DC

## 2019-11-13 MED ORDER — ALBUTEROL SULFATE (2.5 MG/3ML) 0.083% IN NEBU: INHALATION_SOLUTION | RESPIRATORY_TRACT | 1 refills | Status: DC

## 2019-11-13 MED ORDER — ALBUTEROL SULFATE HFA 108 (90 BASE) MCG/ACT IN AERS: INHALATION_SPRAY | RESPIRATORY_TRACT | 11 refills | Status: DC

## 2019-11-13 MED ORDER — AEROCHAMBER MV MISC: 0 refills | Status: DC

## 2019-11-13 MED ORDER — FEXOFENADINE HCL 180 MG PO TABS: 180.0000 mg | ORAL_TABLET | Freq: Every day | ORAL | 3 refills | Status: AC

## 2019-11-13 MED ORDER — BREZTRI AEROSPHERE 160-9-4.8 MCG/ACT IN AERO: 2.0000 | INHALATION_SPRAY | Freq: Two times a day (BID) | RESPIRATORY_TRACT | 0 refills | Status: DC

## 2019-11-13 MED ORDER — FLUTICASONE PROPIONATE 50 MCG/ACT NA SUSP: 2.0000 | Freq: Every day | NASAL | 6 refills | Status: DC

## 2019-11-13 NOTE — Patient Instructions (Addendum)
Thank you for visiting Dr. Valeta Harms at [facility]. Today we recommend the following: Orders Placed This Encounter  Procedures  . CBC w/Diff  . IgE  . Pulmonary Function Test   Meds ordered this encounter  Medications  . albuterol (PROVENTIL) (2.5 MG/3ML) 0.083% nebulizer solution    Sig: INHALE THE CONTENTS OF 1 VIAL VIA NEBULIZER TWICE DAILY    Dispense:  540 mL    Refill:  1    Overdue for followup  . albuterol (VENTOLIN HFA) 108 (90 Base) MCG/ACT inhaler    Sig: INHALE 1 TO 2 PUFFS EVERY 4 TO 6 HOURS AS NEEDED    Dispense:  6.7 g    Refill:  11  . fexofenadine (ALLEGRA) 180 MG tablet    Sig: Take 1 tablet (180 mg total) by mouth daily. Once a day as needed for allergies    Dispense:  90 tablet    Refill:  3  . montelukast (SINGULAIR) 10 MG tablet    Sig: Take 1 tablet (10 mg total) by mouth at bedtime.    Dispense:  90 tablet    Refill:  3  . fluticasone (FLONASE) 50 MCG/ACT nasal spray    Sig: Place 2 sprays into both nostrils daily.    Dispense:  16 g    Refill:  6   Breztri samples with spacer. New rx as well   Return in about 3 months (around 02/11/2020).    Please do your part to reduce the spread of COVID-19.

## 2019-11-13 NOTE — Progress Notes (Signed)
Synopsis: Referred in Jan 2021 for asthma/SOB by Copper, [place], PA-C  Subjective:   PATIENT ID: Rachael Peck GENDER: female DOB: 1946-08-29, MRN: [id number]  Chief Complaint  Patient presents with  . Consult    Former Dr. Lenna Gilford pt here to establish care with Dr. Valeta Harms. Pt has had complaints of cough with clear phlegm, SOB mainly when she coughs, and has pain in her side from coughing.    This is a 74 year old female past medical history of allergic rhinitis, seasonal allergies.  Has had a longstanding history of shortness of breath and respiratory complaints diagnosed with obstructive asthma.  Prior pulmonary function tests with reduced ratio and reduced FEV1 FVC.  Additionally she is overweight BMI of 31.  She does have hypertension.  She has been maintained on oral prednisone for greater than 10 years.  She has been able to quit on occasions but had much difficulty tapering off due to recurrent exacerbations.  At this point she is taking a half a tablet every other day at 2 mg every other day.  From a respiratory standpoint she does have occasional coughing fits.  Sometimes sputum production.  She does have wheezing.  Today in the office she also has a wheezing.  Prior IgE in 2017 low, prior regional allergy panel negative.  Here today to establish care with new pulmonary provider.   Past Medical History:  Diagnosis Date  . Allergic rhinitis   . Anxiety   . Chest pain, atypical   . Chronic obstructive asthma, unspecified   . Diverticulosis of colon   . GERD (gastroesophageal reflux disease)   . Hypertension   . Overweight(278.02)   . Palpitations   . Stress incontinence, female      Family History  Problem Relation Age of Onset  . Kidney failure Mother   . Kidney failure Father      Past Surgical History:  Procedure Laterality Date  . ABDOMINAL HYSTERECTOMY    . BREAST LUMPECTOMY  12/17/2012 & 01/14/2013    Social History   Socioeconomic History  . Marital status:  Single    Spouse name: Not on file  . Number of children: 1  . Years of education: Not on file  . Highest education level: Not on file  Occupational History  . Not on file  Tobacco Use  . Smoking status: Never Smoker  . Smokeless tobacco: Never Used  Substance and Sexual Activity  . Alcohol use: No    Alcohol/week: 0.0 standard drinks  . Drug use: No  . Sexual activity: Not on file  Other Topics Concern  . Not on file  Social History Narrative  . Not on file   Social Determinants of Health   Financial Resource Strain:   . Difficulty of Paying Living Expenses: Not on file  Food Insecurity:   . Worried About Charity fundraiser in the Last Year: Not on file  . Ran Out of Food in the Last Year: Not on file  Transportation Needs:   . Lack of Transportation (Medical): Not on file  . Lack of Transportation (Non-Medical): Not on file  Physical Activity:   . Days of Exercise per Week: Not on file  . Minutes of Exercise per Session: Not on file  Stress:   . Feeling of Stress : Not on file  Social Connections:   . Frequency of Communication with Friends and Family: Not on file  . Frequency of Social Gatherings with Friends and Family: Not on  file  . Attends Religious Services: Not on file  . Active Member of Clubs or Organizations: Not on file  . Attends Archivist Meetings: Not on file  . Marital Status: Not on file  Intimate Partner Violence:   . Fear of Current or Ex-Partner: Not on file  . Emotionally Abused: Not on file  . Physically Abused: Not on file  . Sexually Abused: Not on file     No Known Allergies   Outpatient Medications Prior to Visit  Medication Sig Dispense Refill  . amLODipine (NORVASC) 10 MG tablet Take 5 mg by mouth daily.     . ARTIFICIAL TEARS ophthalmic solution As needed for dry eyes     . aspirin 81 MG chewable tablet Chew by mouth.    . celecoxib (CELEBREX) 200 MG capsule Take by mouth.    . cholecalciferol (VITAMIN D) 1000 units  tablet Take 1,000 Units by mouth daily.    . cyclobenzaprine (FLEXERIL) 5 MG tablet Take 5 mg by mouth 3 (three) times daily as needed for muscle spasms (2 to 3 times as needed).    . famotidine (PEPCID) 20 MG tablet Take 1 tablet (20 mg total) by mouth daily. 90 tablet 3  . Fluticasone-Salmeterol (WIXELA INHUB) 500-50 MCG/DOSE AEPB Inhale 1 puff into the lungs 2 (two) times daily. 180 each 0  . ipratropium (ATROVENT) 0.02 % nebulizer solution INHALE THE CONTENTS OF 1 VIAL VIA NEBULIZER THREE TIMES DAILY 675 mL 3  . KLOR-CON SPRINKLE 10 MEQ CR capsule TAKE 2 CAPSULES TWICE DAILY 360 capsule 2  . loratadine (CLARITIN) 10 MG tablet Take 10 mg by mouth daily as needed.    . metoprolol (LOPRESSOR) 100 MG tablet Take 100 mg by mouth daily.    . naproxen (NAPROSYN) 500 MG tablet Take 500 mg by mouth 3 (three) times daily with meals.    . pantoprazole (PROTONIX) 40 MG tablet TAKE 1 TABLET DAILY  30 MINUTES PRIOR TO BREAKFAST 90 tablet 0  . traZODone (DESYREL) 50 MG tablet Take 50 mg by mouth at bedtime. For sleep     . vitamin B-12 (CYANOCOBALAMIN) 1000 MCG tablet Take 1,000 mcg by mouth daily.    Marland Kitchen albuterol (PROVENTIL) (2.5 MG/3ML) 0.083% nebulizer solution INHALE THE CONTENTS OF 1 VIAL VIA NEBULIZER TWICE DAILY 540 mL 1  . albuterol (VENTOLIN HFA) 108 (90 Base) MCG/ACT inhaler INHALE 1 TO 2 PUFFS EVERY 4 TO 6 HOURS AS NEEDED 6.7 g 5  . aspirin 325 MG tablet Take 325 mg by mouth daily.    . fexofenadine (ALLEGRA) 180 MG tablet Take 1 tablet (180 mg total) by mouth daily. Once a day as needed for allergies 90 tablet 1  . fluticasone (FLONASE) 50 MCG/ACT nasal spray USE 2 SPRAYS IN EACH NOSTRIL EVERY DAY 16 g 1  . montelukast (SINGULAIR) 10 MG tablet Take 1 tablet (10 mg total) by mouth at bedtime. 90 tablet 1  . methylPREDNISolone (MEDROL) 4 MG tablet 4mg  ,1/2 tab qod 30 tablet 0  . methylPREDNISolone (MEDROL) 4 MG tablet Take 1/2 to 1 tablet every other day 45 tablet 0  . traMADol (ULTRAM) 50 MG  tablet Take 1 tablet (50 mg total) by mouth 3 (three) times daily as needed. 90 tablet 1   No facility-administered medications prior to visit.    Review of Systems  Constitutional: Negative for chills, fever, malaise/fatigue and weight loss.  HENT: Negative for hearing loss, sore throat and tinnitus.   Eyes: Negative for blurred  vision and double vision.  Respiratory: Positive for cough, shortness of breath and wheezing. Negative for hemoptysis, sputum production and stridor.   Cardiovascular: Negative for chest pain, palpitations, orthopnea, leg swelling and PND.  Gastrointestinal: Negative for abdominal pain, constipation, diarrhea, heartburn, nausea and vomiting.  Genitourinary: Negative for dysuria, hematuria and urgency.  Musculoskeletal: Negative for joint pain and myalgias.  Skin: Negative for itching and rash.  Neurological: Negative for dizziness, tingling, weakness and headaches.  Endo/Heme/Allergies: Negative for environmental allergies. Does not bruise/bleed easily.  Psychiatric/Behavioral: Negative for depression. The patient is not nervous/anxious and does not have insomnia.   All other systems reviewed and are negative.   Objective:  Physical Exam Vitals reviewed.  Constitutional:      General: She is not in acute distress.    Appearance: She is well-developed.  HENT:     Head: Normocephalic and atraumatic.  Eyes:     General: No scleral icterus.    Conjunctiva/sclera: Conjunctivae normal.     Pupils: Pupils are equal, round, and reactive to light.  Neck:     Vascular: No JVD.     Trachea: No tracheal deviation.  Cardiovascular:     Rate and Rhythm: Normal rate and regular rhythm.     Heart sounds: Normal heart sounds. No murmur.  Pulmonary:     Effort: Pulmonary effort is normal. No tachypnea, accessory muscle usage or respiratory distress.     Breath sounds: Normal breath sounds. No stridor. No wheezing, rhonchi or rales.  Abdominal:     Comments: Obese     Musculoskeletal:        General: No tenderness.     Cervical back: Neck supple.  Lymphadenopathy:     Cervical: No cervical adenopathy.  Skin:    General: Skin is warm and dry.     Capillary Refill: Capillary refill takes less than 2 seconds.     Findings: No rash.  Neurological:     Mental Status: She is alert and oriented to person, place, and time.  Psychiatric:        Behavior: Behavior normal.      Vitals:   11/13/19 1336  BP: 120/70  Pulse: (!) 101  SpO2: 98%  Weight: 201 lb 6.4 oz (91.4 kg)  Height: 5\' 7"  (1.702 m)   98% on RA BMI Readings from Last 3 Encounters:  11/13/19 31.54 kg/m  05/30/18 30.73 kg/m  11/09/17 30.76 kg/m   Wt Readings from Last 3 Encounters:  11/13/19 201 lb 6.4 oz (91.4 kg)  05/30/18 196 lb 3.2 oz (89 kg)  11/09/17 196 lb 6.4 oz (89.1 kg)   December 13, 2018: Care everywhere [facility] results reviewed Influenza a H1 N1 detected positive, reviewed 11/13/2019 office visit   Chest Imaging: Chest x-ray 11/09/2017 hyperinflated lung fields no infiltrate. The patient's images have been independently reviewed by me.    Pulmonary Functions Testing Results: No flowsheet data found.  FeNO: None   Pathology: None   Echocardiogram: 12/24/2018 echo [facility] care everywhere result, LVEF 65 to 70%  Heart Catheterization: None     Assessment & Plan:     ICD-10-CM   1. Chronic obstructive pulmonary disease, unspecified COPD type ([place])  J44.9 Pulmonary Function Test    CBC w/Diff    IgE    IgE    CBC w/Diff  2. Severe persistent asthma without complication  123XX123 CBC w/Diff    IgE    IgE    CBC w/Diff  3. Steroid dependence (  [place])  F19.20 CBC w/Diff    IgE    IgE    CBC w/Diff  4. Establishing care with new doctor, encounter for  Z76.89   5. Allergic rhinitis, unspecified seasonality, unspecified trigger  J30.9    Assessment:   This is a 74 year old female longstanding history of obstructive asthma, severe persistent asthma,  steroid dependence.  Has been on steroids for greater than 10 years on and off.  Currently taking 2 mg of prednisone every other day unable to wean successfully multiple times.  She still has symptoms breakthrough upon this.  Also has difficulty affording her inhalers.  She has not had pulmonary function test in a long time.  Last spirometry was in 2017.  Currently managed with nasal steroids as well as Singulair for allergic rhinitis symptoms.  Also using her nebulizer 2-3 times a day.  She also has breakthrough albuterol.  Previous blood work in 2017 with eosinophils of 300.  Remains with progressive severe persistent asthma symptoms.  Plan Following Extensive Data Review & Interpretation:  . I reviewed prior external note(s) from [facility], 01/21/2019. Primary care office visit, 12/26/2018 care everywhere, [facility].  Review of Dr. Jeannine Kitten last office note 05/30/2018. . I reviewed the result(s) of echocardiogram completed at [facility] on 12/24/2018: Normal ejection fraction LVEF 65 to 70%, lab results care everywhere CMP within normal limits, 2017 absolute eosinophil count 300, IgE 16 . I have ordered repeat blood work to include CBC with differential, IgE level.  We will also have full pulmonary function tests completed prior to the next office visit.  In 3 months will be fine. I believe the next best steps would be likely to start patient on a biologic.  We discussed this again today in the office Currently on Wixela DPI plus as needed albuterol. We will change from this to breztri MDI with spacer.  I gave her samples today of this and she was instructed on how to use the inhalers appropriately. If she is still having symptoms despite inhaler change and unable to wean from steroids we will start paperwork at next office visit for biologic therapy.  Pending lab results we may start this earlier.  We will call patient with lab results.  Case discussed with Derl Barrow, NP.   Garner Nash,  DO [facility] 11/13/2019 2:23 PM

## 2019-11-14 LAB — IGE: IgE (Immunoglobulin E), Serum: 21 kU/L (ref <=114)

## 2019-12-18 ENCOUNTER — Other Ambulatory Visit: Payer: Self-pay | Admitting: Primary Care

## 2019-12-26 ENCOUNTER — Telehealth: Payer: Self-pay | Admitting: Primary Care

## 2019-12-26 NOTE — Telephone Encounter (Signed)
Pt returning a phone call. Pt can be reached at [phone].

## 2019-12-26 NOTE — Telephone Encounter (Signed)
Yes, ok to refill I believe she has been on chronic steroids for many years from Dr. Lenna Gilford.  Can you confirm the dosing with patient please? Thanks Garner Nash, DO [facility] 12/26/2019 7:40 PM

## 2019-12-26 NOTE — Telephone Encounter (Signed)
LMTC x 1  

## 2019-12-26 NOTE — Telephone Encounter (Signed)
Spoke with pt, she is requesting a refill of Medrol 4 mg. I didn't see any record of refill on her med list. Is pt still supposed to be on this? BI please advise if we can refill.   methylPREDNISolone (MEDROL) 4 MG tablet [id number] DISCONTINUED  Order Details Dose, Route, Frequency: As Directed  Dispense Quantity: 90 tablet Refills: 1       Sig: Take daily as directed      Start Date: 05/30/18 End Date: 11/23/18  Discontinued by: Karmen Stabs, RN on 11/23/2018 16:42  Reason: Reorder      Written Date: 05/30/18 Expiration Date: 05/30/19    Diagnosis Association: Chronic obstructive airway disease with asthma ([place]) (J44.9)  Original Order:  methylPREDNISolone (MEDROL) 4 MG tablet [id number]  Providers  Ordering and Authorizing Provider: Noralee Space, MD NPI: [id number]  DEA #:

## 2019-12-27 MED ORDER — METHYLPREDNISOLONE 4 MG PO TABS: ORAL_TABLET | ORAL | 3 refills | Status: DC

## 2019-12-27 NOTE — Telephone Encounter (Signed)
Per chart and verified by pt, she takes 1/2 tablet of 4mg  Medrol every other day, and if she is feeling below baseline she takes an additional 1/2 tablet.   Refill has been sent to pharmacy as requested.  Nothing further needed at this time- will send back to [facility] just as FYI.

## 2020-02-11 ENCOUNTER — Ambulatory Visit: Payer: Medicare HMO | Admitting: Primary Care

## 2020-02-12 ENCOUNTER — Ambulatory Visit: Payer: Medicare HMO | Admitting: Pulmonary Disease

## 2020-02-12 ENCOUNTER — Other Ambulatory Visit: Payer: Self-pay

## 2020-02-12 ENCOUNTER — Encounter: Payer: Self-pay | Admitting: Pulmonary Disease

## 2020-02-12 VITALS — BP 164/72 | HR 97 | Temp 97.7°F | Ht 67.0 in | Wt 203.8 lb

## 2020-02-12 DIAGNOSIS — F1920 Other psychoactive substance dependence, uncomplicated: Secondary | ICD-10-CM | POA: Diagnosis not present

## 2020-02-12 DIAGNOSIS — J449 Chronic obstructive pulmonary disease, unspecified: Secondary | ICD-10-CM | POA: Diagnosis not present

## 2020-02-12 DIAGNOSIS — J309 Allergic rhinitis, unspecified: Secondary | ICD-10-CM

## 2020-02-12 DIAGNOSIS — J301 Allergic rhinitis due to pollen: Secondary | ICD-10-CM

## 2020-02-12 DIAGNOSIS — J4550 Severe persistent asthma, uncomplicated: Secondary | ICD-10-CM | POA: Diagnosis not present

## 2020-02-12 MED ORDER — ALBUTEROL SULFATE HFA 108 (90 BASE) MCG/ACT IN AERS: INHALATION_SPRAY | RESPIRATORY_TRACT | 11 refills | Status: DC

## 2020-02-12 MED ORDER — FLUTICASONE-SALMETEROL 500-50 MCG/DOSE IN AEPB: 1.0000 | INHALATION_SPRAY | Freq: Two times a day (BID) | RESPIRATORY_TRACT | 0 refills | Status: DC

## 2020-02-12 NOTE — Patient Instructions (Addendum)
Thank you for visiting Dr. Valeta Harms at [facility]. Today we recommend the following:  Refills of albuterol and wixela today  Please call with any issues.   Return in about 6 months (around 08/13/2020) for with APP or Dr. Valeta Harms.    Please do your part to reduce the spread of COVID-19.

## 2020-02-12 NOTE — Progress Notes (Signed)
Synopsis: Referred in Jan 2021 for asthma/SOB by Copper, Virginia Rochester, PA-C  Subjective:   PATIENT ID: Rachael Peck GENDER: female DOB: 1946/04/07, MRN: [id number]  Chief Complaint  Patient presents with  . Follow-up    normal cough with clear sputum and whezzing     This is a 74 year old female past medical history of allergic rhinitis, seasonal allergies.  Has had a longstanding history of shortness of breath and respiratory complaints diagnosed with obstructive asthma.  Prior pulmonary function tests with reduced ratio and reduced FEV1 FVC.  Additionally she is overweight BMI of 31.  She does have hypertension.  She has been maintained on oral prednisone for greater than 10 years.  She has been able to quit on occasions but had much difficulty tapering off due to recurrent exacerbations.  At this point she is taking a half a tablet every other day at 2 mg every other day.  From a respiratory standpoint she does have occasional coughing fits.  Sometimes sputum production.  She does have wheezing.  Today in the office she also has a wheezing.  Prior IgE in 2017 low, prior regional allergy panel negative.  Here today to establish care with new pulmonary provider.  OV 02/12/2020: overall doing well today. Still on 5mg  prednisone everyother day. Liked using wixela better than breztri. She would like to go back to it.  Overall respiratory symptoms are stable at this time.  Denies cough sputum production.  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  This has been going on for some time it does limit her mobility.  She does need refills today of medications as she ran out of all of them.   Past Medical History:  Diagnosis Date  . Allergic rhinitis   . Anxiety   . Chest pain, atypical   . Chronic obstructive asthma, unspecified   . Diverticulosis of colon   . GERD (gastroesophageal reflux disease)   . Hypertension   . Overweight(278.02)   . Palpitations   . Stress incontinence, female       Family History  Problem Relation Age of Onset  . Kidney failure Mother   . Kidney failure Father      Past Surgical History:  Procedure Laterality Date  . ABDOMINAL HYSTERECTOMY    . BREAST LUMPECTOMY  12/17/2012 & 01/14/2013    Social History   Socioeconomic History  . Marital status: Single    Spouse name: Not on file  . Number of children: 1  . Years of education: Not on file  . Highest education level: Not on file  Occupational History  . Not on file  Tobacco Use  . Smoking status: Never Smoker  . Smokeless tobacco: Never Used  Substance and Sexual Activity  . Alcohol use: No    Alcohol/week: 0.0 standard drinks  . Drug use: No  . Sexual activity: Not on file  Other Topics Concern  . Not on file  Social History Narrative  . Not on file   Social Determinants of Health   Financial Resource Strain:   . Difficulty of Paying Living Expenses:   Food Insecurity:   . Worried About Charity fundraiser in the Last Year:   . Arboriculturist in the Last Year:   Transportation Needs:   . Film/video editor (Medical):   Marland Kitchen Lack of Transportation (Non-Medical):   Physical Activity:   . Days of Exercise per Week:   . Minutes of Exercise  per Session:   Stress:   . Feeling of Stress :   Social Connections:   . Frequency of Communication with Friends and Family:   . Frequency of Social Gatherings with Friends and Family:   . Attends Religious Services:   . Active Member of Clubs or Organizations:   . Attends Archivist Meetings:   Marland Kitchen Marital Status:   Intimate Partner Violence:   . Fear of Current or Ex-Partner:   . Emotionally Abused:   Marland Kitchen Physically Abused:   . Sexually Abused:      No Known Allergies   Outpatient Medications Prior to Visit  Medication Sig Dispense Refill  . albuterol (PROVENTIL) (2.5 MG/3ML) 0.083% nebulizer solution INHALE THE CONTENTS OF 1 VIAL VIA NEBULIZER TWICE DAILY 540 mL 1  . albuterol (VENTOLIN HFA) 108 (90 Base)  MCG/ACT inhaler INHALE 1 TO 2 PUFFS EVERY 4 TO 6 HOURS AS NEEDED 6.7 g 11  . amLODipine (NORVASC) 10 MG tablet Take 5 mg by mouth daily.     . ARTIFICIAL TEARS ophthalmic solution As needed for dry eyes     . aspirin 81 MG chewable tablet Chew by mouth.    . Budeson-Glycopyrrol-Formoterol (BREZTRI AEROSPHERE) 160-9-4.8 MCG/ACT AERO Inhale 2 puffs into the lungs 2 (two) times daily. 5.9 g 0  . celecoxib (CELEBREX) 200 MG capsule Take by mouth.    . cholecalciferol (VITAMIN D) 1000 units tablet Take 1,000 Units by mouth daily.    . cyclobenzaprine (FLEXERIL) 5 MG tablet Take 5 mg by mouth 3 (three) times daily as needed for muscle spasms (2 to 3 times as needed).    . famotidine (PEPCID) 20 MG tablet Take 1 tablet (20 mg total) by mouth daily. 90 tablet 3  . fexofenadine (ALLEGRA) 180 MG tablet Take 1 tablet (180 mg total) by mouth daily. Once a day as needed for allergies 90 tablet 3  . fluticasone (FLONASE) 50 MCG/ACT nasal spray Place 2 sprays into both nostrils daily. 16 g 6  . Fluticasone-Salmeterol (WIXELA INHUB) 500-50 MCG/DOSE AEPB Inhale 1 puff into the lungs 2 (two) times daily. 180 each 0  . ipratropium (ATROVENT) 0.02 % nebulizer solution INHALE THE CONTENTS OF 1 VIAL VIA NEBULIZER THREE TIMES DAILY 675 mL 3  . KLOR-CON SPRINKLE 10 MEQ CR capsule TAKE 2 CAPSULES TWICE DAILY 360 capsule 2  . loratadine (CLARITIN) 10 MG tablet Take 10 mg by mouth daily as needed.    . methylPREDNISolone (MEDROL) 4 MG tablet Take 1/2 to 1 tablet every other day. 45 tablet 3  . metoprolol (LOPRESSOR) 100 MG tablet Take 100 mg by mouth daily.    . montelukast (SINGULAIR) 10 MG tablet Take 1 tablet (10 mg total) by mouth at bedtime. 90 tablet 3  . naproxen (NAPROSYN) 500 MG tablet Take 500 mg by mouth 3 (three) times daily with meals.    . pantoprazole (PROTONIX) 40 MG tablet TAKE 1 TABLET DAILY  30 MINUTES PRIOR TO BREAKFAST 90 tablet 0  . traZODone (DESYREL) 50 MG tablet Take 50 mg by mouth at bedtime. For  sleep     . vitamin B-12 (CYANOCOBALAMIN) 1000 MCG tablet Take 1,000 mcg by mouth daily.     No facility-administered medications prior to visit.    Review of Systems  Constitutional: Negative for chills, fever, malaise/fatigue and weight loss.  HENT: Negative for hearing loss, sore throat and tinnitus.   Eyes: Negative for blurred vision and double vision.  Respiratory: Negative for cough, hemoptysis, sputum  production, shortness of breath, wheezing and stridor.   Cardiovascular: Negative for chest pain, palpitations, orthopnea, leg swelling and PND.  Gastrointestinal: Negative for abdominal pain, constipation, diarrhea, heartburn, nausea and vomiting.  Genitourinary: Negative for dysuria, hematuria and urgency.  Musculoskeletal: Negative for joint pain and myalgias.  Skin: Negative for itching and rash.  Neurological: Negative for dizziness, tingling, weakness and headaches.  Endo/Heme/Allergies: Negative for environmental allergies. Does not bruise/bleed easily.  Psychiatric/Behavioral: Negative for depression. The patient is not nervous/anxious and does not have insomnia.   All other systems reviewed and are negative.   Objective:  Physical Exam Vitals reviewed.  Constitutional:      General: She is not in acute distress.    Appearance: She is well-developed. She is obese.  HENT:     Head: Normocephalic and atraumatic.  Eyes:     General: No scleral icterus.    Conjunctiva/sclera: Conjunctivae normal.     Pupils: Pupils are equal, round, and reactive to light.  Neck:     Vascular: No JVD.     Trachea: No tracheal deviation.  Cardiovascular:     Rate and Rhythm: Normal rate and regular rhythm.     Heart sounds: Normal heart sounds. No murmur.  Pulmonary:     Effort: Pulmonary effort is normal. No tachypnea, accessory muscle usage or respiratory distress.     Breath sounds: Normal breath sounds. No stridor. No wheezing, rhonchi or rales.  Musculoskeletal:        General:  No tenderness.     Cervical back: Neck supple.  Lymphadenopathy:     Cervical: No cervical adenopathy.  Skin:    General: Skin is warm and dry.     Capillary Refill: Capillary refill takes less than 2 seconds.     Findings: No rash.  Neurological:     Mental Status: She is alert and oriented to person, place, and time.  Psychiatric:        Behavior: Behavior normal.      Vitals:   02/12/20 1413  BP: (!) 164/72  Pulse: 97  Temp: 97.7 F (36.5 C)  TempSrc: Temporal  SpO2: 98%  Weight: 203 lb 12.8 oz (92.4 kg)  Height: 5\' 7"  (1.702 m)   98% on RA BMI Readings from Last 3 Encounters:  02/12/20 31.92 kg/m  11/13/19 31.54 kg/m  05/30/18 30.73 kg/m   Wt Readings from Last 3 Encounters:  02/12/20 203 lb 12.8 oz (92.4 kg)  11/13/19 201 lb 6.4 oz (91.4 kg)  05/30/18 196 lb 3.2 oz (89 kg)   December 13, 2018: Care everywhere [facility] results reviewed Influenza a H1 N1 detected positive, reviewed 11/13/2019 office visit   Chest Imaging: Chest x-ray 11/09/2017 hyperinflated lung fields no infiltrate. The patient's images have been independently reviewed by me.    Pulmonary Functions Testing Results: No flowsheet data found.  FeNO: None   Pathology: None   Echocardiogram: 12/24/2018 echo [facility] care everywhere result, LVEF 65 to 70%  Heart Catheterization: None     Assessment & Plan:     ICD-10-CM   1. Chronic obstructive pulmonary disease, unspecified COPD type ([place])  J44.9   2. Severe persistent asthma without complication  123XX123   3. 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 Allergic rhinitis, unspecified seasonality, unspecified trigger  J30.9   5. Non-seasonal allergic rhinitis due to pollen  J30.1     Assessment:   This is a 75 year old female longstanding history of obstructive asthma, severe persistent asthma steroid  dependence.  She has been on steroids for more than 10 years on and off.  Currently taking 2 mg tablets of prednisone every other day.   She has attempted to wean off this several times in the past but has been unable.  She is currently has issues with affording inhalers.  She once again let her inhaler prescription labs and did not pick up any additional refills.  She would like to go back to the [place].  We will help arrange this today.  Continued management with intranasal steroids and Singulair.  Also use of nebulizer therapy with albuterol as needed.  Plan: New prescription for Wixela Continue her prednisone every other day as she has been on this for many years already and has failed attempts at weaning. Discussed biologic options with patient again but she is not interested. I think it would be best if we can get her off of prednisone at some point but this may be unrealistic expectation as she has been on it greater than 10 years.  Greater than 50% of this patient's 22-minute office visit was spent face-to-face discussing above recommendations and treatment plan.  Garner Nash, DO [facility] 02/12/2020 2:32 PM

## 2020-02-26 ENCOUNTER — Other Ambulatory Visit: Payer: Self-pay | Admitting: Primary Care

## 2020-03-21 ENCOUNTER — Other Ambulatory Visit: Payer: Self-pay | Admitting: Primary Care

## 2020-03-24 ENCOUNTER — Other Ambulatory Visit: Payer: Self-pay | Admitting: *Deleted

## 2020-03-24 MED ORDER — ALBUTEROL SULFATE (2.5 MG/3ML) 0.083% IN NEBU: INHALATION_SOLUTION | RESPIRATORY_TRACT | 5 refills | Status: DC

## 2020-03-27 ENCOUNTER — Other Ambulatory Visit: Payer: Self-pay | Admitting: *Deleted

## 2020-04-03 ENCOUNTER — Other Ambulatory Visit: Payer: Self-pay | Admitting: *Deleted

## 2020-04-03 MED ORDER — ALBUTEROL SULFATE (2.5 MG/3ML) 0.083% IN NEBU: INHALATION_SOLUTION | RESPIRATORY_TRACT | 2 refills | Status: DC

## 2020-04-16 ENCOUNTER — Other Ambulatory Visit: Payer: Self-pay

## 2020-04-16 MED ORDER — FLUTICASONE-SALMETEROL 500-50 MCG/DOSE IN AEPB: 1.0000 | INHALATION_SPRAY | Freq: Two times a day (BID) | RESPIRATORY_TRACT | 2 refills | Status: DC

## 2020-05-19 ENCOUNTER — Other Ambulatory Visit: Payer: Self-pay

## 2020-05-19 MED ORDER — METHYLPREDNISOLONE 4 MG PO TABS: ORAL_TABLET | ORAL | 3 refills | Status: DC

## 2020-05-19 MED ORDER — FLUTICASONE PROPIONATE 50 MCG/ACT NA SUSP: 2.0000 | Freq: Every day | NASAL | 3 refills | Status: DC

## 2020-07-21 ENCOUNTER — Encounter: Payer: Self-pay | Admitting: Primary Care

## 2020-07-21 ENCOUNTER — Other Ambulatory Visit: Payer: Self-pay

## 2020-07-21 ENCOUNTER — Telehealth: Payer: Self-pay | Admitting: Pulmonary Disease

## 2020-07-21 ENCOUNTER — Ambulatory Visit (INDEPENDENT_AMBULATORY_CARE_PROVIDER_SITE_OTHER): Payer: Medicare HMO

## 2020-07-21 ENCOUNTER — Ambulatory Visit: Payer: Medicare HMO | Admitting: Primary Care

## 2020-07-21 VITALS — BP 130/84 | HR 94 | Temp 97.4°F | Ht 67.0 in | Wt 204.6 lb

## 2020-07-21 DIAGNOSIS — J449 Chronic obstructive pulmonary disease, unspecified: Secondary | ICD-10-CM

## 2020-07-21 DIAGNOSIS — J441 Chronic obstructive pulmonary disease with (acute) exacerbation: Secondary | ICD-10-CM

## 2020-07-21 MED ORDER — METHYLPREDNISOLONE 4 MG PO TBPK: ORAL_TABLET | ORAL | 0 refills | Status: DC

## 2020-07-21 MED ORDER — BENZONATATE 200 MG PO CAPS: 200.0000 mg | ORAL_CAPSULE | Freq: Three times a day (TID) | ORAL | 1 refills | Status: DC | PRN

## 2020-07-21 MED ORDER — DOXYCYCLINE HYCLATE 100 MG PO TABS: 100.0000 mg | ORAL_TABLET | Freq: Two times a day (BID) | ORAL | 0 refills | Status: DC

## 2020-07-21 NOTE — Telephone Encounter (Signed)
Schedule follow-up with APP or Dr. Valeta Harms in office to further evaluate symptoms.Rachael Quaker, FNP

## 2020-07-21 NOTE — Patient Instructions (Addendum)
RX: Medrol dose pack as directed Doxycycline 1 tab twice daily x 7 days Tessalon perles three times a day as needed for cough suppression  Orders: CXR today  Follow-up: 1 month with Dr. Valeta Harms OR if symptoms do not improve/worse

## 2020-07-21 NOTE — Telephone Encounter (Signed)
Spoke with pt. She is aware of Brian's response. Pt did not wish to make an appointment at this time, stated that she would call back.

## 2020-07-21 NOTE — Telephone Encounter (Signed)
Primary Pulmonologist: Dr. Valeta Harms Last office visit and with whom: Dr. Valeta Harms 02/12/20 What do we see them for (pulmonary problems): COPD Last OV assessment/plan: SEE BELOW  Was appointment offered to patient (explain)?  No   Reason for call: Called and spoke patient she states that 4 weeks ago she started with a productive cough with clear sputum and is getting up a lot and worse at night. She states that right now when she coughs she is now wheezing and does not want to get Pneumonia. She states that she feels good overall but is tired, sore and her sides hurt. Denies fever, BP in normal range but HR has been up from coughing. Has vaccines. States she did not call sooner because she is used to coughing with her COPD but it has been going on to long. Using rescue inhaler more often but using everything else like normal. Uses humidifer   Aaron Edelman please advise as Dr. Valeta Harms is in procedures all day  (examples of things to ask: : When did symptoms start? Fever? Cough? Productive? Color to sputum? More sputum than usual? Wheezing? Have you needed increased oxygen? Are you taking your respiratory medications? What over the counter measures have you tried?)  No Known Allergies  Immunization History  Administered Date(s) Administered  . Influenza Split 08/14/2011, 07/11/2012, 07/16/2013, 07/24/2014, 07/16/2017  . Influenza, High Dose Seasonal PF 07/30/2015, 07/18/2016, 08/06/2019  . Influenza-Unspecified 06/30/2015  . PFIZER SARS-COV-2 Vaccination 11/12/2019, 12/03/2019  . Pneumococcal Conjugate-13 01/12/2011  . Pneumococcal Polysaccharide-23 08/02/2013    Instructions    Return in about 6 months (around 08/13/2020) for with APP or Dr. Valeta Harms. Thank you for visiting Dr. Valeta Harms at [facility]. Today we recommend the following:  Refills of albuterol and wixela today  Please call with any issues.   Return in about 6 months (around 08/13/2020) for with APP or Dr. Valeta Harms

## 2020-07-21 NOTE — Progress Notes (Signed)
Thanks for seeing her Garner Nash, DO [facility] 07/21/2020 4:09 PM

## 2020-07-21 NOTE — Progress Notes (Signed)
@Patient  ID: Rachael Peck, female    DOB: [birth year], 74 y.o.   MRN: [id number]  Chief Complaint  Patient presents with  . Acute Visit    Pt states she has had flare ups with her asthma and has had constant coughing getting up clear phlegm. Pt also has had complaints of wheezing. States she has had soreness in sides from coughing. Symptoms have been going on x4 weeks now. Pt has had problems with sleeping due to her symptoms.    Referring provider: Copper, Virginia Rochester, PA-C  HPI: 74 year old female, never smoked. PMH significant for chronic obstructive airway disease with asthma, hypertension, allergic rhinitis, LPR, GERD. Patient of Dr. Valeta Harms, last seen in April 2021. Maintained on Wixela 500-76mcg 1 puff twice daily.  Previous [facility] pulmonary encounter: 02/12/20- Dr. Valeta Harms This is a 74 year old female past medical history of allergic rhinitis, seasonal allergies.  Has had a longstanding history of shortness of breath and respiratory complaints diagnosed with obstructive asthma.  Prior pulmonary function tests with reduced ratio and reduced FEV1 FVC.  Additionally she is overweight BMI of 31.  She does have hypertension.  She has been maintained on oral prednisone for greater than 10 years.  She has been able to quit on occasions but had much difficulty tapering off due to recurrent exacerbations.  At this point she is taking a half a tablet every other day at 2 mg every other day.  From a respiratory standpoint she does have occasional coughing fits.  Sometimes sputum production.  She does have wheezing.  Today in the office she also has a wheezing.  Prior IgE in 2017 low, prior regional allergy panel negative.  Here today to establish care with new pulmonary provider.  OV 02/12/2020: overall doing well today. Still on 5mg  prednisone everyother day. Liked using wixela better than breztri. She would like to go back to it.  Overall respiratory symptoms are stable at this time.  Denies cough sputum  production.  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  This has been going on for some time it does limit her mobility.  She does need refills today of medications as she ran out of all of them.  07/21/2020 - Interim hx Patient presents today for acute visit cough. Patient developed productive cough 4 weeks ago with clear sputum. Her respiratory symptoms were previously well controlled with current regimen. Shortness of breath and cough are worse at night, she is having a hard time sleeping. Associated fatigue, wheezing and post nasal drip. She is taking Wixela 500-50 twice daily as prescribed. She has been using albuterol rescuer inhaler more, taking 3 puffs at a given time. She does report improvement with SABA but her heart rate is up a little bit since taking albuterol more. She is on Medrol 4mg  daily, plan is to taper off eventually. She has a follow-up scheduled with Dr. Valeta Harms in November. She received both pfizer covid vaccine, last one in February 2021. Denies fever, chills sweats, chest pain, abd pain, N/V/D.  Pulmonary testing: 10/04/2016 PFT- ratio 66 , FEV1 64%  No Known Allergies  Immunization History  Administered Date(s) Administered  . Influenza Split 08/14/2011, 07/11/2012, 07/16/2013, 07/24/2014, 07/16/2017  . Influenza, High Dose Seasonal PF 07/30/2015, 07/18/2016, 08/06/2019  . Influenza-Unspecified 06/30/2015  . PFIZER SARS-COV-2 Vaccination 11/12/2019, 12/03/2019, 06/01/2020  . Pneumococcal Conjugate-13 01/12/2011  . Pneumococcal Polysaccharide-23 08/02/2013    Past Medical History:  Diagnosis Date  . Allergic rhinitis   .  Anxiety   . Chest pain, atypical   . Chronic obstructive asthma, unspecified   . Diverticulosis of colon   . GERD (gastroesophageal reflux disease)   . Hypertension   . Overweight(278.02)   . Palpitations   . Stress incontinence, female     Tobacco History: Social History   Tobacco Use  Smoking Status Never Smoker  Smokeless  Tobacco Never Used   Counseling given: Not Answered   Outpatient Medications Prior to Visit  Medication Sig Dispense Refill  . albuterol (PROVENTIL) (2.5 MG/3ML) 0.083% nebulizer solution INHALE THE CONTENTS OF 1 VIAL VIA NEBULIZER TWICE DAILY 540 mL 2  . albuterol (VENTOLIN HFA) 108 (90 Base) MCG/ACT inhaler INHALE 1 TO 2 PUFFS EVERY 4 TO 6 HOURS AS NEEDED 6.7 g 11  . amLODipine (NORVASC) 10 MG tablet Take 5 mg by mouth daily.     . ARTIFICIAL TEARS ophthalmic solution As needed for dry eyes     . aspirin 81 MG chewable tablet Chew by mouth.    . celecoxib (CELEBREX) 200 MG capsule Take 200 mg by mouth as needed.    . cholecalciferol (VITAMIN D) 1000 units tablet Take 1,000 Units by mouth daily.    . cyclobenzaprine (FLEXERIL) 5 MG tablet Take 5 mg by mouth 3 (three) times daily as needed for muscle spasms (2 to 3 times as needed).    . famotidine (PEPCID) 20 MG tablet Take 1 tablet (20 mg total) by mouth daily. 90 tablet 3  . fexofenadine (ALLEGRA) 180 MG tablet Take 1 tablet (180 mg total) by mouth daily. Once a day as needed for allergies 90 tablet 3  . fluticasone (FLONASE) 50 MCG/ACT nasal spray Place 2 sprays into both nostrils daily. 16 g 3  . Fluticasone-Salmeterol (WIXELA INHUB) 500-50 MCG/DOSE AEPB Inhale 1 puff into the lungs 2 (two) times daily. 180 each 2  . ipratropium (ATROVENT) 0.02 % nebulizer solution INHALE THE CONTENTS OF 1 VIAL VIA NEBULIZER THREE TIMES DAILY 675 mL 3  . KLOR-CON SPRINKLE 10 MEQ CR capsule TAKE 2 CAPSULES TWICE DAILY 360 capsule 2  . loratadine (CLARITIN) 10 MG tablet Take 10 mg by mouth daily as needed.    . methylPREDNISolone (MEDROL) 4 MG tablet Take 1/2 to 1 tablet every other day. 45 tablet 3  . metoprolol (LOPRESSOR) 100 MG tablet Take 100 mg by mouth daily.    . montelukast (SINGULAIR) 10 MG tablet Take 1 tablet (10 mg total) by mouth at bedtime. 90 tablet 3  . naproxen (NAPROSYN) 500 MG tablet Take 500 mg by mouth 3 (three) times daily with  meals.    . pantoprazole (PROTONIX) 40 MG tablet TAKE 1 TABLET DAILY  30 MINUTES PRIOR TO BREAKFAST 90 tablet 0  . traZODone (DESYREL) 50 MG tablet Take 50 mg by mouth at bedtime. For sleep     . vitamin B-12 (CYANOCOBALAMIN) 1000 MCG tablet Take 1,000 mcg by mouth daily.    . Budeson-Glycopyrrol-Formoterol (BREZTRI AEROSPHERE) 160-9-4.8 MCG/ACT AERO Inhale 2 puffs into the lungs 2 (two) times daily. (Patient not taking: Reported on 07/21/2020) 5.9 g 0  . celecoxib (CELEBREX) 200 MG capsule Take by mouth.     No facility-administered medications prior to visit.    Review of Systems  Review of Systems  Constitutional: Positive for fatigue.  HENT: Positive for postnasal drip.   Respiratory: Positive for cough, shortness of breath and wheezing.    Physical Exam  BP 130/84 (BP Location: Right Arm, Cuff Size: Large)  Pulse 94   Temp (!) 97.4 F (36.3 C) (Other (Comment)) Comment (Src): wrist  Ht 5\' 7"  (1.702 m)   Wt 204 lb 9.6 oz (92.8 kg)   SpO2 100%   BMI 32.04 kg/m  Physical Exam Constitutional:      Appearance: Normal appearance.  HENT:     Head: Normocephalic and atraumatic.     Right Ear: Tympanic membrane normal.     Left Ear: Tympanic membrane normal.     Mouth/Throat:     Comments: Deferred d/t masking Cardiovascular:     Rate and Rhythm: Normal rate and regular rhythm.  Pulmonary:     Effort: Pulmonary effort is normal.     Breath sounds: Normal breath sounds. No wheezing or rhonchi.  Musculoskeletal:        General: Normal range of motion.     Cervical back: Normal range of motion and neck supple.  Skin:    General: Skin is warm and dry.  Neurological:     General: No focal deficit present.     Mental Status: She is alert and oriented to person, place, and time. Mental status is at baseline.  Psychiatric:        Mood and Affect: Mood normal.        Behavior: Behavior normal.        Thought Content: Thought content normal.        Judgment: Judgment normal.       Lab Results:  CBC    Component Value Date/Time   WBC 8.0 11/13/2019 1419   RBC 4.01 11/13/2019 1419   HGB 11.4 (L) 11/13/2019 1419   HCT 34.9 (L) 11/13/2019 1419   PLT 259.0 11/13/2019 1419   MCV 87.0 11/13/2019 1419   MCHC 32.6 11/13/2019 1419   RDW 15.0 11/13/2019 1419   LYMPHSABS 0.8 11/13/2019 1419   MONOABS 1.1 (H) 11/13/2019 1419   EOSABS 0.1 11/13/2019 1419   BASOSABS 0.0 11/13/2019 1419    BMET    Component Value Date/Time   NA 141 05/26/2009 1235   K 3.1 (L) 05/26/2009 1235   CL 103 05/26/2009 1235   CO2 31 05/26/2009 1235   GLUCOSE 89 05/26/2009 1235   BUN 10 05/26/2009 1235   CREATININE 0.7 05/26/2009 1235   CALCIUM 9.2 05/26/2009 1235   GFRNONAA 109.37 05/26/2009 1235    BNP No results found for: BNP  ProBNP No results found for: PROBNP  Imaging: No results found.   Assessment & Plan:   Chronic obstructive airway disease with asthma - Acute exacerbation; Cough x 4 weeks with associated fatigue, shortness of breath and wheezing. Increased SABA use. Compliant Wixela 500-52mcg, Singulair and Flonase. Plan check CXR. Treating with course of Doxycyline and medrol dose pack. She has follow-up with Dr. Valeta Harms in 1 month.    Martyn Ehrich, NP 07/21/2020

## 2020-07-21 NOTE — Assessment & Plan Note (Addendum)
-   Acute exacerbation; Cough x 4 weeks with associated fatigue, shortness of breath and wheezing. Increased SABA use. Compliant Wixela 500-68mcg, Singulair and Flonase. Plan check CXR. Treating with course of Doxycyline and medrol dose pack. She has follow-up with Dr. Valeta Harms in 1 month.

## 2020-08-10 ENCOUNTER — Other Ambulatory Visit: Payer: Self-pay | Admitting: Pulmonary Disease

## 2020-08-10 MED ORDER — FLUTICASONE PROPIONATE 50 MCG/ACT NA SUSP: 2.0000 | Freq: Every day | NASAL | 3 refills | Status: DC

## 2020-08-17 ENCOUNTER — Ambulatory Visit (INDEPENDENT_AMBULATORY_CARE_PROVIDER_SITE_OTHER): Payer: Medicare HMO | Admitting: Pulmonary Disease

## 2020-08-17 ENCOUNTER — Encounter: Payer: Self-pay | Admitting: Pulmonary Disease

## 2020-08-17 ENCOUNTER — Other Ambulatory Visit: Payer: Self-pay

## 2020-08-17 VITALS — BP 140/68 | HR 105 | Temp 97.6°F | Ht 67.0 in | Wt 204.0 lb

## 2020-08-17 DIAGNOSIS — J309 Allergic rhinitis, unspecified: Secondary | ICD-10-CM

## 2020-08-17 DIAGNOSIS — J441 Chronic obstructive pulmonary disease with (acute) exacerbation: Secondary | ICD-10-CM

## 2020-08-17 DIAGNOSIS — F1920 Other psychoactive substance dependence, uncomplicated: Secondary | ICD-10-CM | POA: Diagnosis not present

## 2020-08-17 DIAGNOSIS — J449 Chronic obstructive pulmonary disease, unspecified: Secondary | ICD-10-CM | POA: Diagnosis not present

## 2020-08-17 DIAGNOSIS — J4550 Severe persistent asthma, uncomplicated: Secondary | ICD-10-CM

## 2020-08-17 MED ORDER — TRELEGY ELLIPTA 200-62.5-25 MCG/INH IN AEPB: 1.0000 | INHALATION_SPRAY | Freq: Every day | RESPIRATORY_TRACT | 3 refills | Status: DC

## 2020-08-17 MED ORDER — TRELEGY ELLIPTA 200-62.5-25 MCG/INH IN AEPB: 1.0000 | INHALATION_SPRAY | Freq: Every day | RESPIRATORY_TRACT | 0 refills | Status: DC

## 2020-08-17 NOTE — Progress Notes (Signed)
Synopsis: Referred in Jan 2021 for asthma/SOB by Copper, [place], PA-C  Subjective:   PATIENT ID: Rachael Peck GENDER: female DOB: 21-Oct-1945, MRN: [id number]  Chief Complaint  Patient presents with  . Follow-up    Patient had flare on 10/5 and was seen and is feeling much better but still feels like she is recovering from the flare.     This is a 74 year old female past medical history of allergic rhinitis, seasonal allergies.  Has had a longstanding history of shortness of breath and respiratory complaints diagnosed with obstructive asthma.  Prior pulmonary function tests with reduced ratio and reduced FEV1 FVC.  Additionally she is overweight BMI of 31.  She does have hypertension.  She has been maintained on oral prednisone for greater than 10 years.  She has been able to quit on occasions but had much difficulty tapering off due to recurrent exacerbations.  At this point she is taking a half a tablet every other day at 2 mg every other day.  From a respiratory standpoint she does have occasional coughing fits.  Sometimes sputum production.  She does have wheezing.  Today in the office she also has a wheezing.  Prior IgE in 2017 low, prior regional allergy panel negative.  Here today to establish care with new pulmonary provider.  OV 02/12/2020: overall doing well today. Still on 5mg  prednisone everyother day. Liked using wixela better than breztri. She would like to go back to it.  Overall respiratory symptoms are stable at this time.  Denies cough sputum production.  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  This has been going on for some time it does limit her mobility.  She does need refills today of medications as she ran out of all of them.  OV 08/17/2020: saw BW on the 5th. Started on prednisone and antibiotics for a flare.  Patient doing well at this time however does not feel like she is fully recovered.  At this point was taking 2 mg of prednisone every other day.  She  is interested in going to at least once per day.  She is currently using her inhaler regimen and as prescribed.  At this time currently on [place].  She is not on a triple therapy regimen at the moment.  We discussed changing her inhalers.  She gets very anxious with the concept of switching her medications around as she has been stable for several years up until her recent exacerbation which does seem to have frightened her some she is improving however.   Past Medical History:  Diagnosis Date  . Allergic rhinitis   . Anxiety   . Chest pain, atypical   . Chronic obstructive asthma, unspecified   . Diverticulosis of colon   . GERD (gastroesophageal reflux disease)   . Hypertension   . Overweight(278.02)   . Palpitations   . Stress incontinence, female      Family History  Problem Relation Age of Onset  . Kidney failure Mother   . Kidney failure Father      Past Surgical History:  Procedure Laterality Date  . ABDOMINAL HYSTERECTOMY    . BREAST LUMPECTOMY  12/17/2012 & 01/14/2013    Social History   Socioeconomic History  . Marital status: Single    Spouse name: Not on file  . Number of children: 1  . Years of education: Not on file  . Highest education level: Not on file  Occupational History  . Not  on file  Tobacco Use  . Smoking status: Never Smoker  . Smokeless tobacco: Never Used  Vaping Use  . Vaping Use: Never used  Substance and Sexual Activity  . Alcohol use: No    Alcohol/week: 0.0 standard drinks  . Drug use: No  . Sexual activity: Not on file  Other Topics Concern  . Not on file  Social History Narrative  . Not on file   Social Determinants of Health   Financial Resource Strain:   . Difficulty of Paying Living Expenses: Not on file  Food Insecurity:   . Worried About Charity fundraiser in the Last Year: Not on file  . Ran Out of Food in the Last Year: Not on file  Transportation Needs:   . Lack of Transportation (Medical): Not on file  . Lack of  Transportation (Non-Medical): Not on file  Physical Activity:   . Days of Exercise per Week: Not on file  . Minutes of Exercise per Session: Not on file  Stress:   . Feeling of Stress : Not on file  Social Connections:   . Frequency of Communication with Friends and Family: Not on file  . Frequency of Social Gatherings with Friends and Family: Not on file  . Attends Religious Services: Not on file  . Active Member of Clubs or Organizations: Not on file  . Attends Archivist Meetings: Not on file  . Marital Status: Not on file  Intimate Partner Violence:   . Fear of Current or Ex-Partner: Not on file  . Emotionally Abused: Not on file  . Physically Abused: Not on file  . Sexually Abused: Not on file     No Known Allergies   Outpatient Medications Prior to Visit  Medication Sig Dispense Refill  . albuterol (PROVENTIL) (2.5 MG/3ML) 0.083% nebulizer solution INHALE THE CONTENTS OF 1 VIAL VIA NEBULIZER TWICE DAILY 540 mL 2  . albuterol (VENTOLIN HFA) 108 (90 Base) MCG/ACT inhaler INHALE 1 TO 2 PUFFS EVERY 4 TO 6 HOURS AS NEEDED 6.7 g 11  . amLODipine (NORVASC) 10 MG tablet Take 5 mg by mouth daily.     . ARTIFICIAL TEARS ophthalmic solution As needed for dry eyes     . aspirin 81 MG chewable tablet Chew by mouth.    . benzonatate (TESSALON) 200 MG capsule Take 1 capsule (200 mg total) by mouth 3 (three) times daily as needed for cough. 30 capsule 1  . celecoxib (CELEBREX) 200 MG capsule Take 200 mg by mouth as needed.    . cholecalciferol (VITAMIN D) 1000 units tablet Take 1,000 Units by mouth daily.    . cyclobenzaprine (FLEXERIL) 5 MG tablet Take 5 mg by mouth 3 (three) times daily as needed for muscle spasms (2 to 3 times as needed).    Marland Kitchen doxycycline (VIBRA-TABS) 100 MG tablet Take 1 tablet (100 mg total) by mouth 2 (two) times daily. 14 tablet 0  . famotidine (PEPCID) 20 MG tablet Take 1 tablet (20 mg total) by mouth daily. 90 tablet 3  . fexofenadine (ALLEGRA) 180 MG  tablet Take 1 tablet (180 mg total) by mouth daily. Once a day as needed for allergies 90 tablet 3  . fluticasone (FLONASE) 50 MCG/ACT nasal spray Place 2 sprays into both nostrils daily. 16 g 3  . Fluticasone-Salmeterol (WIXELA INHUB) 500-50 MCG/DOSE AEPB Inhale 1 puff into the lungs 2 (two) times daily. 180 each 2  . ipratropium (ATROVENT) 0.02 % nebulizer solution INHALE THE CONTENTS  OF 1 VIAL VIA NEBULIZER THREE TIMES DAILY 675 mL 3  . KLOR-CON SPRINKLE 10 MEQ CR capsule TAKE 2 CAPSULES TWICE DAILY 360 capsule 2  . loratadine (CLARITIN) 10 MG tablet Take 10 mg by mouth daily as needed.    . methylPREDNISolone (MEDROL DOSEPAK) 4 MG TBPK tablet As directed 21 tablet 0  . methylPREDNISolone (MEDROL) 4 MG tablet Take 1/2 to 1 tablet every other day. 45 tablet 3  . metoprolol (LOPRESSOR) 100 MG tablet Take 100 mg by mouth daily.    . montelukast (SINGULAIR) 10 MG tablet Take 1 tablet (10 mg total) by mouth at bedtime. 90 tablet 3  . naproxen (NAPROSYN) 500 MG tablet Take 500 mg by mouth 3 (three) times daily with meals.    . pantoprazole (PROTONIX) 40 MG tablet TAKE 1 TABLET DAILY  30 MINUTES PRIOR TO BREAKFAST 90 tablet 0  . traZODone (DESYREL) 50 MG tablet Take 50 mg by mouth at bedtime. For sleep     . vitamin B-12 (CYANOCOBALAMIN) 1000 MCG tablet Take 1,000 mcg by mouth daily.     No facility-administered medications prior to visit.    Review of Systems  Constitutional: Negative for chills, fever, malaise/fatigue and weight loss.  HENT: Negative for hearing loss, sore throat and tinnitus.   Eyes: Negative for blurred vision and double vision.  Respiratory: Positive for shortness of breath. Negative for cough, hemoptysis, sputum production, wheezing and stridor.   Cardiovascular: Negative for chest pain, palpitations, orthopnea, leg swelling and PND.  Gastrointestinal: Negative for abdominal pain, constipation, diarrhea, heartburn, nausea and vomiting.  Genitourinary: Negative for dysuria,  hematuria and urgency.  Musculoskeletal: Negative for joint pain and myalgias.  Skin: Negative for itching and rash.  Neurological: Negative for dizziness, tingling, weakness and headaches.  Endo/Heme/Allergies: Negative for environmental allergies. Does not bruise/bleed easily.  Psychiatric/Behavioral: Negative for depression. The patient is not nervous/anxious and does not have insomnia.   All other systems reviewed and are negative.   Objective:  Physical Exam Vitals reviewed.  Constitutional:      General: She is not in acute distress.    Appearance: She is well-developed. She is obese.  HENT:     Head: Normocephalic and atraumatic.  Eyes:     General: No scleral icterus.    Conjunctiva/sclera: Conjunctivae normal.     Pupils: Pupils are equal, round, and reactive to light.  Neck:     Vascular: No JVD.     Trachea: No tracheal deviation.  Cardiovascular:     Rate and Rhythm: Normal rate and regular rhythm.     Heart sounds: Normal heart sounds. No murmur heard.   Pulmonary:     Effort: Pulmonary effort is normal. No tachypnea, accessory muscle usage or respiratory distress.     Breath sounds: Normal breath sounds. No stridor. No wheezing, rhonchi or rales.  Abdominal:     General: Bowel sounds are normal. There is no distension.     Palpations: Abdomen is soft.     Tenderness: There is no abdominal tenderness.  Musculoskeletal:        General: No tenderness.     Cervical back: Neck supple.  Lymphadenopathy:     Cervical: No cervical adenopathy.  Skin:    General: Skin is warm and dry.     Capillary Refill: Capillary refill takes less than 2 seconds.     Findings: No rash.  Neurological:     Mental Status: She is alert and oriented to person, place, and time.  Psychiatric:        Behavior: Behavior normal.      Vitals:   08/17/20 1338  BP: 140/68  Pulse: (!) 105  Temp: 97.6 F (36.4 C)  TempSrc: Temporal  SpO2: 97%  Weight: 204 lb (92.5 kg)  Height: 5'  7" (1.702 m)   97% on RA BMI Readings from Last 3 Encounters:  08/17/20 31.95 kg/m  07/21/20 32.04 kg/m  02/12/20 31.92 kg/m   Wt Readings from Last 3 Encounters:  08/17/20 204 lb (92.5 kg)  07/21/20 204 lb 9.6 oz (92.8 kg)  02/12/20 203 lb 12.8 oz (92.4 kg)   December 13, 2018: Care everywhere [facility] results reviewed Influenza a H1 N1 detected positive, reviewed 11/13/2019 office visit   Chest Imaging: Chest x-ray 11/09/2017 hyperinflated lung fields no infiltrate. The patient's images have been independently reviewed by me.    Pulmonary Functions Testing Results: No flowsheet data found.  FeNO: None   Pathology: None   Echocardiogram: 12/24/2018 echo [facility] care everywhere result, LVEF 65 to 70%  Heart Catheterization: None     Assessment & Plan:     ICD-10-CM   1. Chronic obstructive airway disease with asthma ([place])  J44.9   2. COPD with acute exacerbation ([place])  J44.1   3. 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 Severe persistent asthma without complication  X21.19   5. Allergic rhinitis, unspecified seasonality, unspecified trigger  J30.9     Assessment:   This is a 74 year old female longstanding history obstructive asthma, severe persistent asthma symptoms, steroid dependence.  She has been on and off steroids for 10+ years.  Currently on 2 mg every other day.  She has been unable to wean off of steroids several times in the past.  At this time currently on [place].  Also using intranasal steroids and Singulair and as needed albuterol.  Plan: Would recommend getting her on triple therapy inhaler. New prescription today for Trelegy 200 Continue on prednisone new prescription 2 mg once daily instead of every other day. She is not interested in going on a biologic. She was counseled heavily on the pros cons, risk benefits and alternatives of being on daily prednisone therapy.  Patient to return to clinic and see me or Derl Barrow, NP in 3 months.  I  spent 30 minutes dedicated to the care of this patient on the date of this encounter to include pre-visit review of records, face-to-face time with the patient discussing conditions above, post visit ordering of testing, clinical documentation with the electronic health record, making appropriate referrals as documented, and communicating necessary findings to members of the patients care team.    Garner Nash, DO [facility] 08/17/2020 1:41 PM

## 2020-08-17 NOTE — Patient Instructions (Signed)
Thank you for visiting Dr. Valeta Harms at [facility]. Today we recommend the following:  Meds ordered this encounter  Medications  . Fluticasone-Umeclidin-Vilant (TRELEGY ELLIPTA) 200-62.5-25 MCG/INH AEPB    Sig: Inhale 1 puff into the lungs daily.    Dispense:  3 each    Refill:  3   Start trelegy 200 samples today Inhaler instruction.  Continue prednisone 2mg  daily  Return in about 3 months (around 11/17/2020) for with APP or Dr. Valeta Harms.    Please do your part to reduce the spread of COVID-19.

## 2020-08-28 ENCOUNTER — Other Ambulatory Visit: Payer: Self-pay | Admitting: Pulmonary Disease

## 2020-08-28 MED ORDER — ALBUTEROL SULFATE HFA 108 (90 BASE) MCG/ACT IN AERS: INHALATION_SPRAY | RESPIRATORY_TRACT | 11 refills | Status: DC

## 2020-10-13 ENCOUNTER — Other Ambulatory Visit: Payer: Self-pay | Admitting: Pulmonary Disease

## 2020-10-13 MED ORDER — FLUTICASONE PROPIONATE 50 MCG/ACT NA SUSP: 2.0000 | Freq: Every day | NASAL | 3 refills | Status: DC

## 2020-12-31 ENCOUNTER — Ambulatory Visit: Payer: Medicare HMO | Admitting: Pulmonary Disease

## 2021-01-01 ENCOUNTER — Telehealth: Payer: Self-pay | Admitting: Pulmonary Disease

## 2021-01-01 MED ORDER — METHYLPREDNISOLONE 4 MG PO TABS: ORAL_TABLET | ORAL | 3 refills | Status: DC

## 2021-01-01 MED ORDER — FAMOTIDINE 20 MG PO TABS: 20.0000 mg | ORAL_TABLET | Freq: Every day | ORAL | 3 refills | Status: DC

## 2021-01-01 MED ORDER — IPRATROPIUM BROMIDE 0.02 % IN SOLN: RESPIRATORY_TRACT | 3 refills | Status: DC

## 2021-01-01 MED ORDER — FLUTICASONE PROPIONATE 50 MCG/ACT NA SUSP: 2.0000 | Freq: Every day | NASAL | 3 refills | Status: DC

## 2021-01-01 NOTE — Telephone Encounter (Signed)
I have called the pt and LM on her VM to make her aware that the refills have been sent to the mail order pharmacy.

## 2021-01-01 NOTE — Telephone Encounter (Signed)
pt is calling because insurance has been trying to get in touch with office since 1/22 to get new Rx for flonase. Pt also needs a new rx for pepcid, Medrol & Atrovent. last refill is going to be in used in April on meds (medrol & atrovent). Pt use [employer] mail order.   pt states that she wont be available till after 3-4 pm today, did okay to leave message on voicemail. Please advise [phone]

## 2021-01-26 ENCOUNTER — Ambulatory Visit: Payer: Medicare HMO | Admitting: Adult Health

## 2021-02-24 ENCOUNTER — Ambulatory Visit: Payer: Medicare HMO | Admitting: Pulmonary Disease

## 2021-02-24 ENCOUNTER — Encounter: Payer: Self-pay | Admitting: Pulmonary Disease

## 2021-02-24 ENCOUNTER — Other Ambulatory Visit: Payer: Self-pay

## 2021-02-24 VITALS — BP 152/76 | HR 99 | Ht 67.0 in | Wt 196.0 lb

## 2021-02-24 DIAGNOSIS — J449 Chronic obstructive pulmonary disease, unspecified: Secondary | ICD-10-CM

## 2021-02-24 DIAGNOSIS — J309 Allergic rhinitis, unspecified: Secondary | ICD-10-CM

## 2021-02-24 DIAGNOSIS — F1920 Other psychoactive substance dependence, uncomplicated: Secondary | ICD-10-CM | POA: Diagnosis not present

## 2021-02-24 DIAGNOSIS — J301 Allergic rhinitis due to pollen: Secondary | ICD-10-CM

## 2021-02-24 DIAGNOSIS — J4550 Severe persistent asthma, uncomplicated: Secondary | ICD-10-CM

## 2021-02-24 MED ORDER — TRELEGY ELLIPTA 100-62.5-25 MCG/INH IN AEPB
1.0000 | INHALATION_SPRAY | Freq: Every day | RESPIRATORY_TRACT | 0 refills | Status: DC
Start: 2021-02-24 — End: 2021-06-15

## 2021-02-24 MED ORDER — FLUTICASONE PROPIONATE 50 MCG/ACT NA SUSP
2.0000 | Freq: Every day | NASAL | 5 refills | Status: DC
Start: 2021-02-24 — End: 2021-06-15

## 2021-02-24 MED ORDER — TRELEGY ELLIPTA 100-62.5-25 MCG/INH IN AEPB: 1.0000 | INHALATION_SPRAY | Freq: Every day | RESPIRATORY_TRACT | 5 refills | Status: DC

## 2021-02-24 NOTE — Progress Notes (Signed)
Synopsis: Referred in Jan 2021 for asthma/SOB by Copper, [place], PA-C  Subjective:   PATIENT ID: Rachael Peck GENDER: female DOB: [birth year], MRN: [id number]  Chief Complaint  Patient presents with  . Follow-up    Follow up copd. C/o stable DOE.     This is a 75 year old female past medical history of allergic rhinitis, seasonal allergies.  Has had a longstanding history of shortness of breath and respiratory complaints diagnosed with obstructive asthma.  Prior pulmonary function tests with reduced ratio and reduced FEV1 FVC.  Additionally she is overweight BMI of 31.  She does have hypertension.  She has been maintained on oral prednisone for greater than 10 years.  She has been able to quit on occasions but had much difficulty tapering off due to recurrent exacerbations.  At this point she is taking a half a tablet every other day at 2 mg every other day.  From a respiratory standpoint she does have occasional coughing fits.  Sometimes sputum production.  She does have wheezing.  Today in the office she also has a wheezing.  Prior IgE in 2017 low, prior regional allergy panel negative.  Here today to establish care with new pulmonary provider.  OV 02/12/2020: overall doing well today. Still on 37m prednisone everyother day. Liked using wixela better than breztri. She would like to go back to it.  Overall respiratory symptoms are stable at this time.  Denies cough sputum production.  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  This has been going on for some time it does limit her mobility.  She does need refills today of medications as she ran out of all of them.  OV 08/17/2020: saw BW on the 5th. Started on prednisone and antibiotics for a flare.  Patient doing well at this time however does not feel like she is fully recovered.  At this point was taking 2 mg of prednisone every other day.  She is interested in going to at least once per day.  She is currently using her inhaler  regimen and as prescribed.  At this time currently on WAlexander  She is not on a triple therapy regimen at the moment.  We discussed changing her inhalers.  She gets very anxious with the concept of switching her medications around as she has been stable for several years up until her recent exacerbation which does seem to have frightened her some she is improving however.  02/24/2021: Here today for follow-up of her asthma.  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  She has been on oral prednisone for greater than 10 years.  When we initially met back in 2021 we work to try to come off of prednisone however she continued to have flareups.  And she has significant anxiety about coming off of prednisone so she feels like she must take it.  At 1 point time she was at least on 2 mg every other day.  At this point she is back to 4 mg a day.  She also routinely self medicates with her own regiment of prednisone.  She does however felt better after being placed on the Trelegy.  She has lots of concerns about needing additional blood pressure medications because her blood pressure is now higher than it was before.  She is concerned about being started on lisinopril due to the side effects that she read about.  Therefore she is still only on just 2 blood pressure  medications and did not start the third 1 as recommended by her cardiologist.  Today we had a long discussion about what happens when you take daily prednisone for many many years.  I think she now understands that her hypertension and diabetes and obesity are related to her being on prednisone for so many years.  I do not think that she can sustain on taking prednisone forever.  We have discussed the potential use and transition to [employer] again today to try to come off of prednisone use as well as changing her inhaler regimen she was at least open to changing her inhaler regimen last time and does see the benefit of staying on Trelegy at  this point but is concerned that her new occasional bouts of diarrhea may be related to the Trelegy.   Past Medical History:  Diagnosis Date  . Allergic rhinitis   . Anxiety   . Chest pain, atypical   . Chronic obstructive asthma, unspecified   . Diverticulosis of colon   . GERD (gastroesophageal reflux disease)   . Hypertension   . Overweight(278.02)   . Palpitations   . Stress incontinence, female      Family History  Problem Relation Age of Onset  . Kidney failure Mother   . Kidney failure Father      Past Surgical History:  Procedure Laterality Date  . ABDOMINAL HYSTERECTOMY    . BREAST LUMPECTOMY  12/17/2012 & 01/14/2013    Social History   Socioeconomic History  . Marital status: Single    Spouse name: Not on file  . Number of children: 1  . Years of education: Not on file  . Highest education level: Not on file  Occupational History  . Not on file  Tobacco Use  . Smoking status: Never Smoker  . Smokeless tobacco: Never Used  Vaping Use  . Vaping Use: Never used  Substance and Sexual Activity  . Alcohol use: No    Alcohol/week: 0.0 standard drinks  . Drug use: No  . Sexual activity: Not on file  Other Topics Concern  . Not on file  Social History Narrative  . Not on file   Social Determinants of Health   Financial Resource Strain: Not on file  Food Insecurity: Not on file  Transportation Needs: Not on file  Physical Activity: Not on file  Stress: Not on file  Social Connections: Not on file  Intimate Partner Violence: Not on file     No Known Allergies   Outpatient Medications Prior to Visit  Medication Sig Dispense Refill  . albuterol (PROVENTIL) (2.5 MG/3ML) 0.083% nebulizer solution INHALE THE CONTENTS OF 1 VIAL VIA NEBULIZER TWICE DAILY 540 mL 2  . albuterol (VENTOLIN HFA) 108 (90 Base) MCG/ACT inhaler INHALE 1 TO 2 PUFFS EVERY 4 TO 6 HOURS AS NEEDED 6.7 g 11  . amLODipine (NORVASC) 10 MG tablet Take 5 mg by mouth daily.    . ARTIFICIAL  TEARS ophthalmic solution As needed for dry eyes    . aspirin 81 MG chewable tablet Chew by mouth.    . celecoxib (CELEBREX) 200 MG capsule Take 200 mg by mouth as needed.    . cholecalciferol (VITAMIN D) 1000 units tablet Take 1,000 Units by mouth daily.    . cyclobenzaprine (FLEXERIL) 5 MG tablet Take 5 mg by mouth 3 (three) times daily as needed for muscle spasms (2 to 3 times as needed).    . famotidine (PEPCID) 20 MG tablet Take 1 tablet (20  mg total) by mouth daily. 90 tablet 3  . fexofenadine (ALLEGRA) 180 MG tablet Take 1 tablet (180 mg total) by mouth daily. Once a day as needed for allergies 90 tablet 3  . fluticasone (FLONASE) 50 MCG/ACT nasal spray Place 2 sprays into both nostrils daily. 16 g 3  . Fluticasone-Umeclidin-Vilant (TRELEGY ELLIPTA) 200-62.5-25 MCG/INH AEPB Inhale 1 puff into the lungs daily. 3 each 3  . ipratropium (ATROVENT) 0.02 % nebulizer solution Use twice daily 675 mL 3  . KLOR-CON SPRINKLE 10 MEQ CR capsule TAKE 2 CAPSULES TWICE DAILY 360 capsule 2  . loratadine (CLARITIN) 10 MG tablet Take 10 mg by mouth daily as needed.    . methylPREDNISolone (MEDROL) 4 MG tablet Take 1/2 to 1 tablet every other day. 45 tablet 3  . metoprolol (LOPRESSOR) 100 MG tablet Take 100 mg by mouth daily.    . montelukast (SINGULAIR) 10 MG tablet Take 1 tablet (10 mg total) by mouth at bedtime. 90 tablet 3  . naproxen (NAPROSYN) 500 MG tablet Take 500 mg by mouth 3 (three) times daily with meals.    . pantoprazole (PROTONIX) 40 MG tablet TAKE 1 TABLET DAILY  30 MINUTES PRIOR TO BREAKFAST 90 tablet 0  . traZODone (DESYREL) 50 MG tablet Take 50 mg by mouth at bedtime. For sleep    . vitamin B-12 (CYANOCOBALAMIN) 1000 MCG tablet Take 1,000 mcg by mouth daily.    . benzonatate (TESSALON) 200 MG capsule Take 1 capsule (200 mg total) by mouth 3 (three) times daily as needed for cough. (Patient not taking: Reported on 02/24/2021) 30 capsule 1  . doxycycline (VIBRA-TABS) 100 MG tablet Take 1  tablet (100 mg total) by mouth 2 (two) times daily. (Patient not taking: Reported on 02/24/2021) 14 tablet 0  . Fluticasone-Salmeterol (WIXELA INHUB) 500-50 MCG/DOSE AEPB Inhale 1 puff into the lungs 2 (two) times daily. (Patient not taking: Reported on 02/24/2021) 180 each 2  . Fluticasone-Umeclidin-Vilant (TRELEGY ELLIPTA) 200-62.5-25 MCG/INH AEPB Inhale 1 puff into the lungs daily. (Patient not taking: Reported on 02/24/2021) 28 each 0  . methylPREDNISolone (MEDROL DOSEPAK) 4 MG TBPK tablet As directed (Patient not taking: Reported on 02/24/2021) 21 tablet 0   No facility-administered medications prior to visit.    Review of Systems  Constitutional: Negative for chills, fever, malaise/fatigue and weight loss.  HENT: Negative for hearing loss, sore throat and tinnitus.   Eyes: Negative for blurred vision and double vision.  Respiratory: Positive for shortness of breath. Negative for cough, hemoptysis, sputum production, wheezing and stridor.   Cardiovascular: Negative for chest pain, palpitations, orthopnea, leg swelling and PND.  Gastrointestinal: Negative for abdominal pain, constipation, diarrhea, heartburn, nausea and vomiting.  Genitourinary: Negative for dysuria, hematuria and urgency.  Musculoskeletal: Negative for joint pain and myalgias.  Skin: Negative for itching and rash.  Neurological: Negative for dizziness, tingling, weakness and headaches.  Endo/Heme/Allergies: Negative for environmental allergies. Does not bruise/bleed easily.  Psychiatric/Behavioral: Negative for depression. The patient is not nervous/anxious and does not have insomnia.   All other systems reviewed and are negative.   Objective:  Physical Exam Vitals reviewed.  Constitutional:      General: She is not in acute distress.    Appearance: She is well-developed. She is obese.  HENT:     Head: Normocephalic and atraumatic.  Eyes:     General: No scleral icterus.    Conjunctiva/sclera: Conjunctivae normal.      Pupils: Pupils are equal, round, and reactive to light.  Neck:     Vascular: No JVD.     Trachea: No tracheal deviation.  Cardiovascular:     Rate and Rhythm: Normal rate and regular rhythm.     Heart sounds: Normal heart sounds. No murmur heard.   Pulmonary:     Effort: Pulmonary effort is normal. No tachypnea, accessory muscle usage or respiratory distress.     Breath sounds: Normal breath sounds. No stridor. No wheezing, rhonchi or rales.  Abdominal:     General: Bowel sounds are normal. There is no distension.     Palpations: Abdomen is soft.     Tenderness: There is no abdominal tenderness.  Musculoskeletal:        General: No tenderness.     Cervical back: Neck supple.  Lymphadenopathy:     Cervical: No cervical adenopathy.  Skin:    General: Skin is warm and dry.     Capillary Refill: Capillary refill takes less than 2 seconds.     Findings: No rash.  Neurological:     Mental Status: She is alert and oriented to person, place, and time.  Psychiatric:        Behavior: Behavior normal.      Vitals:   02/24/21 1435  BP: (!) 152/76  Pulse: 99  SpO2: 99%  Weight: 196 lb (88.9 kg)  Height: 5' 7" (1.702 m)   99% on RA BMI Readings from Last 3 Encounters:  02/24/21 30.70 kg/m  08/17/20 31.95 kg/m  07/21/20 32.04 kg/m   Wt Readings from Last 3 Encounters:  02/24/21 196 lb (88.9 kg)  08/17/20 204 lb (92.5 kg)  07/21/20 204 lb 9.6 oz (92.8 kg)   December 13, 2018: Care everywhere [facility] results reviewed Influenza a H1 N1 detected positive, reviewed 11/13/2019 office visit   Chest Imaging: Chest x-ray 11/09/2017 hyperinflated lung fields no infiltrate. The patient's images have been independently reviewed by me.    Pulmonary Functions Testing Results: No flowsheet data found.  FeNO: None   Pathology: None   Echocardiogram: 12/24/2018 echo [facility] care everywhere result, LVEF 65 to 70%  Heart Catheterization: None     Assessment & Plan:      ICD-10-CM   1. Chronic obstructive airway disease with asthma ([place])  J44.9   2. 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 Severe persistent asthma without complication  E15.83   4. Non-seasonal allergic rhinitis due to pollen  J30.1   5. Allergic rhinitis, unspecified seasonality, unspecified trigger  J30.9     Assessment:   This is a 75 year old female, BMI of 30, chronic obstructive pulmonary disease with asthma, steroid dependence, seasonal allergies and allergic rhinitis.  She has been on and off steroids for 10+ years.  At 1 point time was at least down to 2 mg every other day.  Now back up to 4 mg/day.  She has been unable to wean successfully off of steroids in the past.  Currently using intranasal steroids, antihistamines, Singulair and as needed albuterol.  We discussed today once again the pros cons risk benefits and alternatives of being on daily prednisone.  I think she somewhat understands that her development of diabetes, hypertension and being overweight is related to being on prednisone daily.  In the past patient has been very adamant about not wanting to change what she has been on for many years.  I think she is now coming around to the idea that we should make changes to her regimen.  Plan: She needs to  be on triple therapy inhaler regimen with Trelegy. She is doing better on this. She needs to continue her antihistamine, Singulair and as needed albuterol. She needs to continue her intranasal steroid. Refills were given for all of these today. Patient was counseled on tapering off of oral prednisone. If she continues to flare despite tapering she needs to consider transition to a biologic. Being on a biologic is likely safer than her being on daily prednisone for a long period of time.   Garner Nash, DO [facility] 02/24/2021 2:49 PM

## 2021-02-24 NOTE — Patient Instructions (Signed)
Thank you for visiting Dr. Valeta Harms at [facility]. Today we recommend the following:  Drop to prednisone 2mg  daily  Continue trelegy  Continue albuterol neb only No ipratropium  Continue vitamin D supplement  Continue flonase   Return in about 3 months (around 05/27/2021) for with APP or Dr. Valeta Harms.    Please do your part to reduce the spread of COVID-19.

## 2021-03-18 ENCOUNTER — Telehealth: Payer: Self-pay | Admitting: Pulmonary Disease

## 2021-03-18 MED ORDER — ALBUTEROL SULFATE HFA 108 (90 BASE) MCG/ACT IN AERS: INHALATION_SPRAY | RESPIRATORY_TRACT | 11 refills | Status: DC

## 2021-03-18 NOTE — Telephone Encounter (Signed)
Called and spoke with Patient.  Patient stated [employer] had attempted to contact office for Albuterol HFA refill.  Albuterol HFA refill sent to requested [facility].  Nothing further at this time.

## 2021-03-24 ENCOUNTER — Other Ambulatory Visit: Payer: Self-pay

## 2021-03-24 MED ORDER — ALBUTEROL SULFATE (2.5 MG/3ML) 0.083% IN NEBU: INHALATION_SOLUTION | RESPIRATORY_TRACT | 2 refills | Status: DC

## 2021-04-13 ENCOUNTER — Telehealth: Payer: Self-pay | Admitting: Primary Care

## 2021-04-13 MED ORDER — DOXYCYCLINE HYCLATE 100 MG PO TABS: 100.0000 mg | ORAL_TABLET | Freq: Two times a day (BID) | ORAL | 0 refills | Status: DC

## 2021-04-13 NOTE — Telephone Encounter (Signed)
Called and spoke with patient. She is aware that Eustaquio Maize has sent in the doxy. She verbalized understanding.   Nothing further needed at time of call.

## 2021-04-13 NOTE — Telephone Encounter (Signed)
Called patient but she did not answer. Left message for her to call back.  

## 2021-04-13 NOTE — Telephone Encounter (Signed)
I sent in Doxy, needs to take with food and wear sunscreen. Continue trelegy 200. She should be on oral steroids already I believe. She does not need more since she is not complaining of shortness of breath or wheezing. Recommend mucinex 1200mg  twice a day as well.

## 2021-04-13 NOTE — Telephone Encounter (Signed)
Primary Pulmonologist: Icard Last office visit and with whom: 02/24/21 Icard  What do we see them for (pulmonary problems): Chronic obstructive airway disease with asthma ([place])  Steroid dependence ([place])  Severe persistent asthma without complication  Non-seasonal allergic rhinitis due to pollen  Allergic rhinitis, unspecified seasonality, unspecified trigger  Last OV assessment/plan:  Important   (BP Location: Left Arm, Cuff Size: Normal) Pulse 99 Ht _0  (1.702 m) Wt 196 lb (88.9 kg) SpO2 99% BMI 30.70 kg/m BSA 2.05 m  More Vitals   Flowsheets:  Extended Vitals, NEWS, MEWS Score, Anthropometrics, Method of Visit   Encounter Info:  Billing Info, History, Allergies, Detailed Report     Orthostatic Vitals Recorded in This Encounter   02/24/2021  1435     BP Location: Left Arm  Cuff Size: Normal    All Notes    Progress Notes by Garner Nash, DO at 02/24/2021 2:45 PM  Author: Garner Nash, DO Author Type: Physician Filed: 02/24/2021  5:07 PM  Note Status: Signed Cosign: Cosign Not Required Encounter Date: 02/24/2021  Editor: Garner Nash, DO (Physician)                Synopsis: Referred in Jan 2021 for asthma/SOB by Copper, [place], PA-C   Subjective:    PATIENT ID: [place]: female DOB: [birth year], MRN: [id number]       Chief Complaint  Patient presents with   Follow-up      Follow up copd. C/o stable DOE.       This is a 75 year old female past medical history of allergic rhinitis, seasonal allergies.  Has had a longstanding history of shortness of breath and respiratory complaints diagnosed with obstructive asthma.  Prior pulmonary function tests with reduced ratio and reduced FEV1 FVC.  Additionally she is overweight BMI of 31.  She does have hypertension.  She has been maintained on oral prednisone for greater than 10 years.  She has been able to quit on occasions but had much difficulty tapering off due to recurrent  exacerbations.  At this point she is taking a half a tablet every other day at 2 mg every other day.  From a respiratory standpoint she does have occasional coughing fits.  Sometimes sputum production.  She does have wheezing.  Today in the office she also has a wheezing.  Prior IgE in 2017 low, prior regional allergy panel negative.  Here today to establish care with new pulmonary provider.   OV 02/12/2020: overall doing well today. Still on 73m prednisone everyother day. Liked using wixela better than breztri. She would like to go back to it.  Overall respiratory symptoms are stable at this time.  Denies cough sputum production.  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  This has been going on for some time it does limit her mobility.  She does need refills today of medications as she ran out of all of them.   OV 08/17/2020: saw BW on the 5th. Started on prednisone and antibiotics for a flare.  Patient doing well at this time however does not feel like she is fully recovered.  At this point was taking 2 mg of prednisone every other day.  She is interested in going to at least once per day.  She is currently using her inhaler regimen and as prescribed.  At this time currently on [place]  She is not on a triple therapy regimen at the moment.  We discussed  changing her inhalers.  She gets very anxious with the concept of switching her medications around as she has been stable for several years up until her recent exacerbation which does seem to have frightened her some she is improving however.   02/24/2021: Here today for follow-up of her asthma.  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  She has been on oral prednisone for greater than 10 years.  When we initially met back in 2021 we work to try to come off of prednisone however she continued to have flareups.  And she has significant anxiety about coming off of prednisone so she feels like she must take it.  At 1 point time she  was at least on 2 mg every other day.  At this point she is back to 4 mg a day.  She also routinely self medicates with her own regiment of prednisone.  She does however felt better after being placed on the Trelegy.  She has lots of concerns about needing additional blood pressure medications because her blood pressure is now higher than it was before.  She is concerned about being started on lisinopril due to the side effects that she read about.  Therefore she is still only on just 2 blood pressure medications and did not start the third 1 as recommended by her cardiologist.  Today we had a long discussion about what happens when you take daily prednisone for many many years.  I think she now understands that her hypertension and diabetes and obesity are related to her being on prednisone for so many years.  I do not think that she can sustain on taking prednisone forever.  We have discussed the potential use and transition to [facility] again today to try to come off of prednisone use as well as changing her inhaler regimen she was at least open to changing her inhaler regimen last time and does see the benefit of staying on Trelegy at this point but is concerned that her new occasional bouts of diarrhea may be related to the Trelegy.         Past Medical History:  Diagnosis Date   Allergic rhinitis     Anxiety     Chest pain, atypical     Chronic obstructive asthma, unspecified     Diverticulosis of colon     GERD (gastroesophageal reflux disease)     Hypertension     Overweight(278.02)     Palpitations     Stress incontinence, female             Family History  Problem Relation Age of Onset   Kidney failure Mother     Kidney failure Father             Past Surgical History:  Procedure Laterality Date   ABDOMINAL HYSTERECTOMY       BREAST LUMPECTOMY   12/17/2012 & 01/14/2013      Social History         Socioeconomic History   Marital status: Single      Spouse name: Not on file    Number of children: 1   Years of education: Not on file   Highest education level: Not on file  Occupational History   Not on file  Tobacco Use   Smoking status: Never Smoker   Smokeless tobacco: Never Used  Vaping Use   Vaping Use: Never used  Substance and Sexual Activity   Alcohol use: No  Alcohol/week: 0.0 standard drinks   Drug use: No   Sexual activity: Not on file  Other Topics Concern   Not on file  Social History Narrative   Not on file    Social Determinants of Health    Financial Resource Strain: Not on file  Food Insecurity: Not on file  Transportation Needs: Not on file  Physical Activity: Not on file  Stress: Not on file  Social Connections: Not on file  Intimate Partner Violence: Not on file      No Known Allergies          Outpatient Medications Prior to Visit  Medication Sig Dispense Refill   albuterol (PROVENTIL) (2.5 MG/3ML) 0.083% nebulizer solution INHALE THE CONTENTS OF 1 VIAL VIA NEBULIZER TWICE DAILY 540 mL 2   albuterol (VENTOLIN HFA) 108 (90 Base) MCG/ACT inhaler INHALE 1 TO 2 PUFFS EVERY 4 TO 6 HOURS AS NEEDED 6.7 g 11   amLODipine (NORVASC) 10 MG tablet Take 5 mg by mouth daily.       ARTIFICIAL TEARS ophthalmic solution As needed for dry eyes       aspirin 81 MG chewable tablet Chew by mouth.       celecoxib (CELEBREX) 200 MG capsule Take 200 mg by mouth as needed.       cholecalciferol (VITAMIN D) 1000 units tablet Take 1,000 Units by mouth daily.       cyclobenzaprine (FLEXERIL) 5 MG tablet Take 5 mg by mouth 3 (three) times daily as needed for muscle spasms (2 to 3 times as needed).       famotidine (PEPCID) 20 MG tablet Take 1 tablet (20 mg total) by mouth daily. 90 tablet 3   fexofenadine (ALLEGRA) 180 MG tablet Take 1 tablet (180 mg total) by mouth daily. Once a day as needed for allergies 90 tablet 3   fluticasone (FLONASE) 50 MCG/ACT nasal spray Place 2 sprays into both nostrils daily. 16 g 3   Fluticasone-Umeclidin-Vilant  (TRELEGY ELLIPTA) 200-62.5-25 MCG/INH AEPB Inhale 1 puff into the lungs daily. 3 each 3   ipratropium (ATROVENT) 0.02 % nebulizer solution Use twice daily 675 mL 3   KLOR-CON SPRINKLE 10 MEQ CR capsule TAKE 2 CAPSULES TWICE DAILY 360 capsule 2   loratadine (CLARITIN) 10 MG tablet Take 10 mg by mouth daily as needed.       methylPREDNISolone (MEDROL) 4 MG tablet Take 1/2 to 1 tablet every other day. 45 tablet 3   metoprolol (LOPRESSOR) 100 MG tablet Take 100 mg by mouth daily.       montelukast (SINGULAIR) 10 MG tablet Take 1 tablet (10 mg total) by mouth at bedtime. 90 tablet 3   naproxen (NAPROSYN) 500 MG tablet Take 500 mg by mouth 3 (three) times daily with meals.       pantoprazole (PROTONIX) 40 MG tablet TAKE 1 TABLET DAILY  30 MINUTES PRIOR TO BREAKFAST 90 tablet 0   traZODone (DESYREL) 50 MG tablet Take 50 mg by mouth at bedtime. For sleep       vitamin B-12 (CYANOCOBALAMIN) 1000 MCG tablet Take 1,000 mcg by mouth daily.       benzonatate (TESSALON) 200 MG capsule Take 1 capsule (200 mg total) by mouth 3 (three) times daily as needed for cough. (Patient not taking: Reported on 02/24/2021) 30 capsule 1   doxycycline (VIBRA-TABS) 100 MG tablet Take 1 tablet (100 mg total) by mouth 2 (two) times daily. (Patient not taking: Reported on 02/24/2021) 14 tablet 0  Fluticasone-Salmeterol (WIXELA INHUB) 500-50 MCG/DOSE AEPB Inhale 1 puff into the lungs 2 (two) times daily. (Patient not taking: Reported on 02/24/2021) 180 each 2   Fluticasone-Umeclidin-Vilant (TRELEGY ELLIPTA) 200-62.5-25 MCG/INH AEPB Inhale 1 puff into the lungs daily. (Patient not taking: Reported on 02/24/2021) 28 each 0   methylPREDNISolone (MEDROL DOSEPAK) 4 MG TBPK tablet As directed (Patient not taking: Reported on 02/24/2021) 21 tablet 0    No facility-administered medications prior to visit.      Review of Systems Constitutional: Negative for chills, fever, malaise/fatigue and weight loss. HENT: Negative for hearing loss, sore  throat and tinnitus.   Eyes: Negative for blurred vision and double vision. Respiratory: Positive for shortness of breath. Negative for cough, hemoptysis, sputum production, wheezing and stridor.   Cardiovascular: Negative for chest pain, palpitations, orthopnea, leg swelling and PND. Gastrointestinal: Negative for abdominal pain, constipation, diarrhea, heartburn, nausea and vomiting. Genitourinary: Negative for dysuria, hematuria and urgency. Musculoskeletal: Negative for joint pain and myalgias. Skin: Negative for itching and rash. Neurological: Negative for dizziness, tingling, weakness and headaches. Endo/Heme/Allergies: Negative for environmental allergies. Does not bruise/bleed easily. Psychiatric/Behavioral: Negative for depression. The patient is not nervous/anxious and does not have insomnia.   All other systems reviewed and are negative.     Objective:  Physical Exam Vitals reviewed.  Constitutional:      General: She is not in acute distress.    Appearance: She is well-developed. She is obese.  HENT:    Head: Normocephalic and atraumatic.  Eyes:    General: No scleral icterus.    Conjunctiva/sclera: Conjunctivae normal.     Pupils: Pupils are equal, round, and reactive to light.  Neck:    Vascular: No JVD.     Trachea: No tracheal deviation.  Cardiovascular:    Rate and Rhythm: Normal rate and regular rhythm.     Heart sounds: Normal heart sounds. No murmur heard.    Pulmonary:    Effort: Pulmonary effort is normal. No tachypnea, accessory muscle usage or respiratory distress.     Breath sounds: Normal breath sounds. No stridor. No wheezing, rhonchi or rales.  Abdominal:     General: Bowel sounds are normal. There is no distension.     Palpations: Abdomen is soft.     Tenderness: There is no abdominal tenderness. Musculoskeletal:        General: No tenderness.     Cervical back: Neck supple. Lymphadenopathy:    Cervical: No cervical adenopathy.  Skin:     General: Skin is warm and dry.     Capillary Refill: Capillary refill takes less than 2 seconds.     Findings: No rash.  Neurological:    Mental Status: She is alert and oriented to person, place, and time.  Psychiatric:        Behavior: Behavior normal.            Vitals:    02/24/21 1435  BP: (!) 152/76  Pulse: 99  SpO2: 99%  Weight: 196 lb (88.9 kg)  Height: _0  (1.702 m)    99% on RA    BMI Readings from Last 3 Encounters:  02/24/21 30.70 kg/m  08/17/20 31.95 kg/m  07/21/20 32.04 kg/m       Wt Readings from Last 3 Encounters:  02/24/21 196 lb (88.9 kg)  08/17/20 204 lb (92.5 kg)  07/21/20 204 lb 9.6 oz (92.8 kg)    December 13, 2018: Care everywhere [facility] results reviewed Influenza a H1 N1 detected positive, reviewed  11/13/2019 office visit   Chest Imaging: Chest x-ray 11/09/2017 hyperinflated lung fields no infiltrate. The patient's images have been independently reviewed by me.     Pulmonary Functions Testing Results: No flowsheet data found.   FeNO: None   Pathology: None   Echocardiogram: 12/24/2018 echo [facility] care everywhere result, LVEF 65 to 70%   Heart Catheterization: None    Assessment & Plan:        ICD-10-CM    1. Chronic obstructive airway disease with asthma ([place]) J44.9    2. Steroid dependence ([facility]) F19.20    3. Severe persistent asthma without complication F57.32    4. Non-seasonal allergic rhinitis due to pollen J30.1    5. Allergic rhinitis, unspecified seasonality, unspecified trigger J30.9        Assessment:    This is a 75 year old female, BMI of 30, chronic obstructive pulmonary disease with asthma, steroid dependence, seasonal allergies and allergic rhinitis.  She has been on and off steroids for 10+ years.  At 1 point time was at least down to 2 mg every other day.  Now back up to 4 mg/day.  She has been unable to wean successfully off of steroids in the past.  Currently using intranasal steroids, antihistamines,  Singulair and as needed albuterol.   We discussed today once again the pros cons risk benefits and alternatives of being on daily prednisone.  I think she somewhat understands that her development of diabetes, hypertension and being overweight is related to being on prednisone daily.  In the past patient has been very adamant about not wanting to change what she has been on for many years.  I think she is now coming around to the idea that we should make changes to her regimen.   Plan: She needs to be on triple therapy inhaler regimen with Trelegy. She is doing better on this. She needs to continue her antihistamine, Singulair and as needed albuterol. She needs to continue her intranasal steroid. Refills were given for all of these today. Patient was counseled on tapering off of oral prednisone. If she continues to flare despite tapering she needs to consider transition to a biologic. Being on a biologic is likely safer than her being on daily prednisone for a long period of time.          Was appointment offered to patient (explain)?  Pt lives in [place]   Reason for call: Pt is stating that is having a COPD flare which includes having productive cough since June 8th. Pt states no F/C/N/V/D and no SOB. Pt states increase coughing has caused side pain. Elizabeth please advise r/t Dr. Valeta Harms being unavailable.   (examples of things to ask: : When did symptoms start? Fever? Cough? Productive? Color to sputum? More sputum than usual? Wheezing? Have you needed increased oxygen? Are you taking your respiratory medications? What over the counter measures have you tried?)  No Known Allergies  Immunization History  Administered Date(s) Administered   Influenza Split 08/14/2011, 07/11/2012, 07/16/2013, 07/24/2014, 07/16/2017   Influenza, High Dose Seasonal PF 07/30/2015, 07/18/2016, 08/06/2019   Influenza-Unspecified 06/30/2015   PFIZER(Purple Top)SARS-COV-2 Vaccination 11/12/2019, 12/03/2019,  06/01/2020   Pneumococcal Conjugate-13 01/12/2011   Pneumococcal Polysaccharide-23 08/02/2013

## 2021-06-15 ENCOUNTER — Encounter: Payer: Self-pay | Admitting: Primary Care

## 2021-06-15 ENCOUNTER — Other Ambulatory Visit: Payer: Self-pay

## 2021-06-15 ENCOUNTER — Telehealth: Payer: Self-pay | Admitting: Primary Care

## 2021-06-15 ENCOUNTER — Other Ambulatory Visit: Payer: Self-pay | Admitting: Primary Care

## 2021-06-15 ENCOUNTER — Ambulatory Visit (INDEPENDENT_AMBULATORY_CARE_PROVIDER_SITE_OTHER): Payer: Medicare HMO | Admitting: Primary Care

## 2021-06-15 VITALS — BP 138/82 | HR 94 | Temp 97.9°F | Ht 67.0 in | Wt 196.6 lb

## 2021-06-15 DIAGNOSIS — J449 Chronic obstructive pulmonary disease, unspecified: Secondary | ICD-10-CM

## 2021-06-15 DIAGNOSIS — J4550 Severe persistent asthma, uncomplicated: Secondary | ICD-10-CM

## 2021-06-15 LAB — CBC WITH DIFFERENTIAL/PLATELET
Basophils Absolute: 0 10*3/uL (ref 0.0–0.1)
Basophils Relative: 0.9 % (ref 0.0–3.0)
Eosinophils Absolute: 0.3 10*3/uL (ref 0.0–0.7)
Eosinophils Relative: 7.3 % — ABNORMAL HIGH (ref 0.0–5.0)
HCT: 34.1 % — ABNORMAL LOW (ref 36.0–46.0)
Hemoglobin: 11.3 g/dL — ABNORMAL LOW (ref 12.0–15.0)
Lymphocytes Relative: 21.3 % (ref 12.0–46.0)
Lymphs Abs: 1 10*3/uL (ref 0.7–4.0)
MCHC: 33.2 g/dL (ref 30.0–36.0)
MCV: 87 fl (ref 78.0–100.0)
Monocytes Absolute: 0.5 10*3/uL (ref 0.1–1.0)
Monocytes Relative: 12 % (ref 3.0–12.0)
Neutro Abs: 2.6 10*3/uL (ref 1.4–7.7)
Neutrophils Relative %: 58.5 % (ref 43.0–77.0)
Platelets: 282 10*3/uL (ref 150.0–400.0)
RBC: 3.91 Mil/uL (ref 3.87–5.11)
RDW: 13.9 % (ref 11.5–15.5)
WBC: 4.5 10*3/uL (ref 4.0–10.5)

## 2021-06-15 MED ORDER — PROMETHAZINE-CODEINE 6.25-10 MG/5ML PO SYRP: 5.0000 mL | ORAL_SOLUTION | Freq: Four times a day (QID) | ORAL | 0 refills | Status: DC | PRN

## 2021-06-15 MED ORDER — PROMETHAZINE-CODEINE 6.25-10 MG/5ML PO SYRP: 5.0000 mL | ORAL_SOLUTION | Freq: Three times a day (TID) | ORAL | 0 refills | Status: DC | PRN

## 2021-06-15 MED ORDER — TRELEGY ELLIPTA 100-62.5-25 MCG/INH IN AEPB: 1.0000 | INHALATION_SPRAY | Freq: Every day | RESPIRATORY_TRACT | 0 refills | Status: DC

## 2021-06-15 MED ORDER — METHYLPREDNISOLONE 4 MG PO TABS: ORAL_TABLET | ORAL | 3 refills | Status: DC

## 2021-06-15 NOTE — Patient Instructions (Addendum)
Recommendations: - Continue Trelegy 200 - one puff daily - Sending in RX for methylprednisolone to [facility] - After we get your labs back we will start you on a biologic medication for your asthma - see attached   Orders: - Labs today  Follow-up: - 6-8 weeks with Dr. Valeta Harms

## 2021-06-15 NOTE — Telephone Encounter (Signed)
I called the patient and left a message to verify which pharmacy patient wants the cough syrup sent to since [facility] no longer carries Promethazine-Codine cough syrup.

## 2021-06-15 NOTE — Telephone Encounter (Signed)
Patient has steroid dependent asthma with recurrent exacerbations. Dr. Valeta Harms would like to start [facility] biologic injections. Can you help [place] coordinate this? I have not started paper work yet

## 2021-06-15 NOTE — Assessment & Plan Note (Addendum)
-   Steriod dependent asthma with recurrent exacerbations. She has had a persistent cough for the last 3 months. She ran out of methylprednisolone d/t medication delivery issues. - Recommend checking CBC and IgE while off oral steriods.  - Sending in Rx refill methylprednisolone and PWC for cough suppression (pharmacy on file does not supply this medication, we will need to send prescription to another pharmacy of her choice) - Discussed with Dr. Valeta Harms, plan start process for [facility] biologic injections. We will coordinate with our pharmacy team  - FU in 6-8 weeks

## 2021-06-15 NOTE — Progress Notes (Signed)
'@Patient'  ID: Rachael Peck, female    DOB: 20-Dec-1945, 75 y.o.   MRN: [id number]  Chief Complaint  Patient presents with   Follow-up    Patient reports she has productive cough, clear since May. She reports she has had a couple of flare ups this summer.      Referring provider: Copper, Virginia Rochester, PA-C  HPI: 75 year old female, never smoked. PMH significant for chronic obstructive airway disease with asthma, hypertension, allergic rhinitis, LPR, GERD. Patient of Dr. Valeta Harms, last seen in April 2021. Maintained on Trelegy 243mg.  Previous [facility] pulmonary encounter: This is a 75year old female past medical history of allergic rhinitis, seasonal allergies.  Has had a longstanding history of shortness of breath and respiratory complaints diagnosed with obstructive asthma.  Prior pulmonary function tests with reduced ratio and reduced FEV1 FVC.  Additionally she is overweight BMI of 31.  She does have hypertension.  She has been maintained on oral prednisone for greater than 10 years.  She has been able to quit on occasions but had much difficulty tapering off due to recurrent exacerbations.  At this point she is taking a half a tablet every other day at 2 mg every other day.  From a respiratory standpoint she does have occasional coughing fits.  Sometimes sputum production.  She does have wheezing.  Today in the office she also has a wheezing.  Prior IgE in 2017 low, prior regional allergy panel negative.  Here today to establish care with new pulmonary provider.  OV 02/12/2020: overall doing well today. Still on 510mprednisone everyother day. Liked using wixela better than breztri. She would like to go back to it.  Overall respiratory symptoms are stable at this time.  Denies cough sputum production.  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  This has been going on for some time it does limit her mobility.  She does need refills today of medications as she ran out of all of them.  OV  08/17/2020: saw BW on the 5th. Started on prednisone and antibiotics for a flare.  Patient doing well at this time however does not feel like she is fully recovered.  At this point was taking 2 mg of prednisone every other day.  She is interested in going to at least once per day.  She is currently using her inhaler regimen and as prescribed.  At this time currently on WiLuther She is not on a triple therapy regimen at the moment.  We discussed changing her inhalers.  She gets very anxious with the concept of switching her medications around as she has been stable for several years up until her recent exacerbation which does seem to have frightened her some she is improving however.  02/24/2021: Here today for follow-up of her asthma.  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  She has been on oral prednisone for greater than 10 years.  When we initially met back in 2021 we work to try to come off of prednisone however she continued to have flareups.  And she has significant anxiety about coming off of prednisone so she feels like she must take it.  At 1 point time she was at least on 2 mg every other day.  At this point she is back to 4 mg a day.  She also routinely self medicates with her own regiment of prednisone.  She does however felt better after being placed on the Trelegy.  She has lots of concerns about needing additional blood pressure medications because her blood pressure is now higher than it was before.  She is concerned about being started on lisinopril due to the side effects that she read about.  Therefore she is still only on just 2 blood pressure medications and did not start the third 1 as recommended by her cardiologist.  Today we had a long discussion about what happens when you take daily prednisone for many many years.  I think she now understands that her hypertension and diabetes and obesity are related to her being on prednisone for so many years.  I do not think  that she can sustain on taking prednisone forever.  We have discussed the potential use and transition to [facility]olog[facility] again today to try to come off of prednisone use as well as changing her inhaler regimen she was at least open to changing her inhaler regimen last time and does see the benefit of staying on Trelegy at this point but is concerned that her new occasional bouts of diarrhea may be related to the Trelegy.  06/15/2021- interim hx  Patient presents today for acute visit. She has hx asthma which is steroid dependent. She has had a continuous cough since May. She has associated PND and chest tightness. Cough is keeping her up at night. She is compliant with Trelegy 243mg daily. She uses her albuterol 3-4 times a day with temporary improvement. She has been out of methylprednisolone for 2-3 months due to shipment issues. Discuss starting biologic therapy for poorly controlled asthma symptoms and help with transition off oral steroids.   No Known Allergies  Immunization History  Administered Date(s) Administered   Influenza Split 08/14/2011, 07/11/2012, 07/16/2013, 07/24/2014, 07/16/2017   Influenza, High Dose Seasonal PF 07/30/2015, 07/18/2016, 07/14/2017, 07/26/2018, 08/06/2019   Influenza-Unspecified 06/30/2015   PFIZER(Purple Top)SARS-COV-2 Vaccination 11/12/2019, 12/03/2019, 06/01/2020   Pneumococcal Conjugate-13 01/12/2011   Pneumococcal Polysaccharide-23 08/02/2013    Past Medical History:  Diagnosis Date   Allergic rhinitis    Anxiety    Chest pain, atypical    Chronic obstructive asthma, unspecified    Diverticulosis of colon    GERD (gastroesophageal reflux disease)    Hypertension    Overweight(278.02)    Palpitations    Stress incontinence, female     Tobacco History: Social History   Tobacco Use  Smoking Status Never  Smokeless Tobacco Never   Counseling given: Not Answered   Outpatient Medications Prior to Visit  Medication Sig Dispense Refill   albuterol  (PROVENTIL) (2.5 MG/3ML) 0.083% nebulizer solution INHALE THE CONTENTS OF 1 VIAL VIA NEBULIZER TWICE DAILY 540 mL 2   albuterol (VENTOLIN HFA) 108 (90 Base) MCG/ACT inhaler INHALE 1 TO 2 PUFFS EVERY 4 TO 6 HOURS AS NEEDED 6.7 g 11   amLODipine (NORVASC) 10 MG tablet Take 5 mg by mouth daily.     ARTIFICIAL TEARS ophthalmic solution As needed for dry eyes     aspirin 81 MG chewable tablet Chew by mouth.     celecoxib (CELEBREX) 200 MG capsule Take 200 mg by mouth as needed.     cholecalciferol (VITAMIN D) 1000 units tablet Take 1,000 Units by mouth daily.     cyclobenzaprine (FLEXERIL) 5 MG tablet Take 5 mg by mouth 3 (three) times daily as needed for muscle spasms (2 to 3 times as needed).     doxycycline (VIBRA-TABS) 100 MG tablet Take 1 tablet (100 mg total) by mouth 2 (two) times daily. 1[place]  tablet 0   famotidine (PEPCID) 20 MG tablet Take 1 tablet (20 mg total) by mouth daily. 90 tablet 3   fexofenadine (ALLEGRA) 180 MG tablet Take 1 tablet (180 mg total) by mouth daily. Once a day as needed for allergies 90 tablet 3   fluticasone (FLONASE) 50 MCG/ACT nasal spray Place 2 sprays into both nostrils daily. 16 g 3   Fluticasone-Umeclidin-Vilant (TRELEGY ELLIPTA) 100-62.5-25 MCG/INH AEPB Inhale 1 puff into the lungs daily. 60 each 5   ipratropium (ATROVENT) 0.02 % nebulizer solution Use twice daily 675 mL 3   KLOR-CON SPRINKLE 10 MEQ CR capsule TAKE 2 CAPSULES TWICE DAILY 360 capsule 2   lisinopril (ZESTRIL) 10 MG tablet Take 10 mg by mouth daily.     loratadine (CLARITIN) 10 MG tablet Take 10 mg by mouth daily as needed.     metoprolol (LOPRESSOR) 100 MG tablet Take 100 mg by mouth daily.     montelukast (SINGULAIR) 10 MG tablet Take 1 tablet (10 mg total) by mouth at bedtime. 90 tablet 3   naproxen (NAPROSYN) 500 MG tablet Take 500 mg by mouth 3 (three) times daily with meals.     pantoprazole (PROTONIX) 40 MG tablet TAKE 1 TABLET DAILY  30 MINUTES PRIOR TO BREAKFAST 90 tablet 0   traZODone  (DESYREL) 50 MG tablet Take 50 mg by mouth at bedtime. For sleep     vitamin B-12 (CYANOCOBALAMIN) 1000 MCG tablet Take 1,000 mcg by mouth daily.     methylPREDNISolone (MEDROL) 4 MG tablet Take 1/2 to 1 tablet every other day. 45 tablet 3   fluticasone (FLONASE) 50 MCG/ACT nasal spray Place 2 sprays into both nostrils daily. (Patient not taking: Reported on 06/15/2021) 16 g 5   Fluticasone-Umeclidin-Vilant (TRELEGY ELLIPTA) 100-62.5-25 MCG/INH AEPB Inhale 1 puff into the lungs daily. (Patient not taking: Reported on 06/15/2021) 2 each 0   Fluticasone-Umeclidin-Vilant (TRELEGY ELLIPTA) 200-62.5-25 MCG/INH AEPB Inhale 1 puff into the lungs daily. (Patient not taking: Reported on 06/15/2021) 3 each 3   No facility-administered medications prior to visit.      Review of Systems  Review of Systems  Constitutional: Negative.   HENT:  Positive for postnasal drip.   Respiratory:  Positive for cough and chest tightness. Negative for shortness of breath and wheezing.     Physical Exam  BP 138/82 (BP Location: Left Arm, Patient Position: Sitting, Cuff Size: Normal)   Pulse 94   Temp 97.9 F (36.6 C) (Oral)   Ht '5\' 7"'  (1.702 m)   Wt 196 lb 9.6 oz (89.2 kg)   SpO2 98%   BMI 30.79 kg/m  Physical Exam Constitutional:      Appearance: Normal appearance.  HENT:     Head: Normocephalic and atraumatic.  Cardiovascular:     Rate and Rhythm: Normal rate and regular rhythm.  Pulmonary:     Effort: Pulmonary effort is normal.     Breath sounds: Normal breath sounds. No wheezing.     Comments: CTA; no wheezing Musculoskeletal:        General: Normal range of motion.  Skin:    General: Skin is warm and dry.  Neurological:     General: No focal deficit present.     Mental Status: She is alert and oriented to person, place, and time. Mental status is at baseline.  Psychiatric:        Mood and Affect: Mood normal.        Behavior: Behavior normal.  Thought Content: Thought content normal.         Judgment: Judgment normal.     Lab Results:  CBC    Component Value Date/Time   WBC 4.5 06/15/2021 1451   RBC 3.91 06/15/2021 1451   HGB 11.3 (L) 06/15/2021 1451   HCT 34.1 (L) 06/15/2021 1451   PLT 282.0 06/15/2021 1451   MCV 87.0 06/15/2021 1451   MCHC 33.2 06/15/2021 1451   RDW 13.9 06/15/2021 1451   LYMPHSABS 1.0 06/15/2021 1451   MONOABS 0.5 06/15/2021 1451   EOSABS 0.3 06/15/2021 1451   BASOSABS 0.0 06/15/2021 1451    BMET    Component Value Date/Time   NA 141 05/26/2009 1235   K 3.1 (L) 05/26/2009 1235   CL 103 05/26/2009 1235   CO2 31 05/26/2009 1235   GLUCOSE 89 05/26/2009 1235   BUN 10 05/26/2009 1235   CREATININE 0.7 05/26/2009 1235   CALCIUM 9.2 05/26/2009 1235   GFRNONAA 109.37 05/26/2009 1235    BNP No results found for: BNP  ProBNP No results found for: PROBNP  Imaging: No results found.   Assessment & Plan:   Chronic obstructive airway disease with asthma - Steriod dependent asthma with recurrent exacerbations. She has had a persistent cough for the last 3 months. She ran out of methylprednisolone d/t medication delivery issues. - Recommend checking CBC and IgE while off oral steriods.  - Sending in Rx refill methylprednisolone and PWC for cough suppression (pharmacy on file does not supply this medication, we will need to send prescription to another pharmacy of her choice) - Discussed with Dr. Valeta Harms, plan start process for tezspire biologic injections. We will coordinate with our pharmacy team  - FU in 6-8 weeks      Martyn Ehrich, NP 06/15/2021

## 2021-06-16 ENCOUNTER — Encounter: Payer: Self-pay | Admitting: Pulmonary Disease

## 2021-06-16 DIAGNOSIS — J4550 Severe persistent asthma, uncomplicated: Secondary | ICD-10-CM | POA: Insufficient documentation

## 2021-06-16 LAB — IGE: IgE (Immunoglobulin E), Serum: 19 kU/L (ref <=114)

## 2021-06-16 MED ORDER — PROMETHAZINE-CODEINE 6.25-10 MG/5ML PO SYRP: 5.0000 mL | ORAL_SOLUTION | Freq: Three times a day (TID) | ORAL | 0 refills | Status: AC | PRN

## 2021-06-16 NOTE — Telephone Encounter (Signed)
HPI Patient called today by [facility] Pulmonary pharmacy team to review Tezspire new start.  Past medical history includes severe persistent asthma and corticosteroid dependent, hypertension, allergic rhinitis, LPR, GERD.  Respiratory Medications Current regimen: Trelegy 251mg once daily, montelukast 10 mg nightly, loratidine '10mg'$  daily prn, methylprednisolone (2-4 mg every other day)  OBJECTIVE No Known Allergies  Outpatient Encounter Medications as of 06/15/2021  Medication Sig Note   albuterol (PROVENTIL) (2.5 MG/3ML) 0.083% nebulizer solution INHALE THE CONTENTS OF 1 VIAL VIA NEBULIZER TWICE DAILY    albuterol (VENTOLIN HFA) 108 (90 Base) MCG/ACT inhaler INHALE 1 TO 2 PUFFS EVERY 4 TO 6 HOURS AS NEEDED    amLODipine (NORVASC) 10 MG tablet Take 5 mg by mouth daily.    ARTIFICIAL TEARS ophthalmic solution As needed for dry eyes    aspirin 81 MG chewable tablet Chew by mouth.    celecoxib (CELEBREX) 200 MG capsule Take 200 mg by mouth as needed.    cholecalciferol (VITAMIN D) 1000 units tablet Take 1,000 Units by mouth daily.    cyclobenzaprine (FLEXERIL) 5 MG tablet Take 5 mg by mouth 3 (three) times daily as needed for muscle spasms (2 to 3 times as needed).    doxycycline (VIBRA-TABS) 100 MG tablet Take 1 tablet (100 mg total) by mouth 2 (two) times daily.    famotidine (PEPCID) 20 MG tablet Take 1 tablet (20 mg total) by mouth daily.    fexofenadine (ALLEGRA) 180 MG tablet Take 1 tablet (180 mg total) by mouth daily. Once a day as needed for allergies    fluticasone (FLONASE) 50 MCG/ACT nasal spray Place 2 sprays into both nostrils daily.    Fluticasone-Umeclidin-Vilant (TRELEGY ELLIPTA) 100-62.5-25 MCG/INH AEPB Inhale 1 puff into the lungs daily.    Fluticasone-Umeclidin-Vilant (TRELEGY ELLIPTA) 100-62.5-25 MCG/INH AEPB Inhale 1 puff into the lungs daily.    ipratropium (ATROVENT) 0.02 % nebulizer solution Use twice daily    KLOR-CON SPRINKLE 10 MEQ CR capsule TAKE 2 CAPSULES TWICE  DAILY    lisinopril (ZESTRIL) 10 MG tablet Take 10 mg by mouth daily.    loratadine (CLARITIN) 10 MG tablet Take 10 mg by mouth daily as needed.    methylPREDNISolone (MEDROL) 4 MG tablet Take 1/2 to 1 tablet every other day.    metoprolol (LOPRESSOR) 100 MG tablet Take 100 mg by mouth daily.    montelukast (SINGULAIR) 10 MG tablet Take 1 tablet (10 mg total) by mouth at bedtime.    naproxen (NAPROSYN) 500 MG tablet Take 500 mg by mouth 3 (three) times daily with meals.    pantoprazole (PROTONIX) 40 MG tablet TAKE 1 TABLET DAILY  30 MINUTES PRIOR TO BREAKFAST    traZODone (DESYREL) 50 MG tablet Take 50 mg by mouth at bedtime. For sleep    vitamin B-12 (CYANOCOBALAMIN) 1000 MCG tablet Take 1,000 mcg by mouth daily. 03/29/2016: Received from: External Pharmacy Received Sig:    [DISCONTINUED] promethazine-codeine (PHENERGAN WITH CODEINE) 6.25-10 MG/5ML syrup Take 5 mLs by mouth every 8 (eight) hours as needed for up to 14 days for cough.    No facility-administered encounter medications on file as of 06/15/2021.    Immunization History  Administered Date(s) Administered   Influenza Split 08/14/2011, 07/11/2012, 07/16/2013, 07/24/2014, 07/16/2017   Influenza, High Dose Seasonal PF 07/30/2015, 07/18/2016, 07/14/2017, 07/26/2018, 08/06/2019   Influenza-Unspecified 06/30/2015   PFIZER(Purple Top)SARS-COV-2 Vaccination 11/12/2019, 12/03/2019, 06/01/2020   Pneumococcal Conjugate-13 01/12/2011   Pneumococcal Polysaccharide-23 08/02/2013    PFTs No flowsheet data found.  Eosinophils Most recent blood eosinophil count was 300 cells/microL taken on 06/15/21.   IgE: 19 on 06/15/21  Assessment   Biologics training for tezepulumab Cheron Every)  Goals of therapy: Mechanism: human monoclonal IgG2? antibody that binds to TSLP. This blocks TSLP from its effect on inflammation including reduce eosinophils, IgE, FeNO, IL-5, and IL-13. Mechanism is not definitively established. Reviewed that Cheron Every is  add-on medication and patient must continue maintenance inhaler regimen. Response to therapy: may take 3-4 months to determine efficacy.  Side effects: injection site reaction (6-18%), antibody development (2%), arthralgia (4%), back pain (4%), pharyngitis (4%)  Dose: Tezspire 210 mg once every 4 weeks  Administration/Storage:  Reviewed that medication must be administered in healthcare setting by healthcare provider. Patient aware that injection will be administered at infusion center. Reviewed administration sites of thigh or abdomen (at least 2-3 inches away from abdomen). Reviewed the upper arm is only appropriate if caregiver is administering injection. Do not shake syringe as this could lead to product foaming or precipitation. Reviewed one hour monitoring time after first injection.  Medication Reconciliation  A drug regimen assessment was performed, including review of allergies, interactions, disease-state management, dosing and immunization history. Medications were reviewed with the patient, including name, instructions, indication, goals of therapy, potential side effects, importance of adherence, and safe use.  Drug interaction(s): none noted  Immunizations  Patient is indicated for the influenzae and shingles vaccinations. Patient has received 3 COVID19 vaccines. She it UTD on pneumonia vaccine.  PLAN Will run BIV through [facility] infusion center. Patient does not have supplemental plan. Therapy plan placed for [place] at [facility]. Once approved, patient is aware that infusion center will reach out with cost and schedule visit. Patient aware that it is not available for self-admin at this time and she must be monitored for 1 hour after first injection.  All questions encouraged and answered.  Instructed patient to reach out with any further questions or concerns.  Thank you for allowing pharmacy to participate in this patient's  care.  This appointment required 60 minutes of patient care (this includes precharting, chart review, review of results, face-to-face care, etc.).   Knox Saliva, PharmD, MPH, BCPS Clinical Pharmacist (Rheumatology and Pulmonology)

## 2021-06-16 NOTE — Telephone Encounter (Signed)
Patient would like cough syrup sent to [facility]. Phone number is [phone]. Patient phone number is [phone].

## 2021-06-16 NOTE — Telephone Encounter (Signed)
Pharmacy has been updated in her chart. Medication has been pended.   Beth, can you please advise? Thanks!

## 2021-06-17 ENCOUNTER — Other Ambulatory Visit (HOSPITAL_COMMUNITY): Payer: Self-pay

## 2021-06-17 ENCOUNTER — Encounter: Payer: Self-pay | Admitting: Pulmonary Disease

## 2021-06-18 ENCOUNTER — Telehealth: Payer: Self-pay

## 2021-06-18 ENCOUNTER — Other Ambulatory Visit (HOSPITAL_COMMUNITY): Payer: Self-pay

## 2021-06-18 NOTE — Telephone Encounter (Signed)
Submitted a Prior Authorization request to [facility] for [facility] via CoverMyMeds. Will update once we receive a response.   Key: Delaney Meigs

## 2021-06-18 NOTE — Telephone Encounter (Signed)
Hello Team,  Please consider different therapy choice due to patient insurance plan and she does not qualify for patient assistance. She will be responsible for 20% of the cost and patient has only met a small portion of her deductible.   Please advise???

## 2021-06-18 NOTE — Telephone Encounter (Signed)
Both Fasenra and Dupixent will require PAs prior to coverage, however they are both on the plan's list of formulary alternatives alongside Nucala and Xolair. Just let us know what you would consider the most appropriate substitute and we will proceed.

## 2021-06-18 NOTE — Telephone Encounter (Signed)
Will begin BIV for Fasenra in separate encounter.

## 2021-06-18 NOTE — Telephone Encounter (Signed)
We can go ahead and start process for [facility]

## 2021-06-18 NOTE — Telephone Encounter (Signed)
Are you referring to her inhalers or Tezspire? Are either fasenra or dupixent covered?

## 2021-06-22 ENCOUNTER — Other Ambulatory Visit (HOSPITAL_COMMUNITY): Payer: Self-pay

## 2021-06-22 NOTE — Telephone Encounter (Signed)
Received notification from [facility] regarding a prior authorization for [facility]. Authorization has been APPROVED from 06/19/2021 to 10/16/2021.   Per test claim, copay for 28 days supply is $858.86  Patient can fill through [place]: [phone]   Authorization # [id number]   Will begin PAP paperwork.

## 2021-06-23 NOTE — Telephone Encounter (Signed)
Reached out to patient and discussed that therapy plan had been changed from [place] to [place] and that PA had been obtained but would need to pursue additional pt assistance. Verified address to mail PAP form. Pt verbalized understanding and states that she will probably come drop forms off at clinic when finished. Prescriber portion to be place in Dr. Volanda Napoleon box

## 2021-06-24 NOTE — Telephone Encounter (Signed)
Provider form received, awaiting return of pt form.

## 2021-06-30 ENCOUNTER — Telehealth: Payer: Self-pay | Admitting: Primary Care

## 2021-06-30 NOTE — Telephone Encounter (Signed)
Called and spoke with Patient.  Patient stated she was prescribed promethazine-codeine 06/16/21, by Eustaquio Maize, NP.  Patient stated [facility] does not have promethazine-codeine and [facility]  Patient stated she received a message from [facility] stating medication is no longer available, but a fax was sent with recommended substitutions.   Offered to have prescription sent to another pharmacy.  At this time [facility] is still providing promethazine codeine. Patient stated she would rather have a substitution called in to [place] [facility]. I checked on fax and it was not located in office.  [facility] and spoke with [facility].  Promethazine codeine substitutions are Promethazine Dextromethorphan and Hycodan. Patient requested substitution be sent to [facility]. Beth, NP is not in office this week. Patient is aware message is being sent to Dr. Valeta Harms and may not be addressed until 07/01/21.  Message routed to Dr. Valeta Harms to advise

## 2021-07-01 MED ORDER — PROMETHAZINE-DM 6.25-15 MG/5ML PO SYRP: 5.0000 mL | ORAL_SOLUTION | Freq: Four times a day (QID) | ORAL | 0 refills | Status: DC | PRN

## 2021-07-01 NOTE — Telephone Encounter (Signed)
Returned call to patient and reminded her that we were no longer pursuing therapy with Cheron Every (as she did not qualify for the patient assistance) and advised that she could disregard any further communication from [facility] regarding Tezspire. Patient verbalized understanding. Pt stated that she had just received PAP paperwork for [facility] yesterday, and that she would complete it and deliver it directly to the clinic tomorrow 9/16. Confirmed to her that I will be on site.

## 2021-07-01 NOTE — Telephone Encounter (Signed)
Called and informed Patient alternative prescription had been sent to requested [place] pharmacy.  Advised to call with any issues or questions.  Nothing further at this time.

## 2021-07-01 NOTE — Telephone Encounter (Signed)
Patient states [employer] needs prior authorization for Tezspire. Reference number is [id number]. [employer] phone number is [phone].

## 2021-07-02 NOTE — Telephone Encounter (Signed)
Patient dropped off paperwork at clinic. Faxing application, approval letter, insurance card, and allergy/medlist to [employer]. Will provide update once we receive a response.  Fax# [phone] Phone# [phone]

## 2021-07-07 ENCOUNTER — Telehealth: Payer: Self-pay | Admitting: Pulmonary Disease

## 2021-07-07 NOTE — Telephone Encounter (Signed)
Received a fax from  [place] regarding an approval for [facility] patient assistance from 07/07/2021 to 10/16/2021.   Phone number: [phone]  Called and shared news with patient, transferred to [facility] for scheduling New Start vist.

## 2021-07-07 NOTE — Telephone Encounter (Signed)
Patient scheduled for Rachael Peck new start appointment on 07/14/21. She will plan to bring her sister who may administer the medication  Knox Saliva, PharmD, MPH, BCPS Clinical Pharmacist (Rheumatology and Pulmonology)

## 2021-07-07 NOTE — Telephone Encounter (Signed)
Pt wanted to give BI and BW an update and states the Promethazinedm syrup called in is helping alot and wanted to thank them. Does not need a call back. Will forward to them as FYI. Nothing further needed at this time.

## 2021-07-14 ENCOUNTER — Other Ambulatory Visit: Payer: Self-pay

## 2021-07-14 ENCOUNTER — Ambulatory Visit: Payer: Medicare HMO | Admitting: Pharmacist

## 2021-07-14 DIAGNOSIS — J4550 Severe persistent asthma, uncomplicated: Secondary | ICD-10-CM

## 2021-07-14 MED ORDER — FASENRA PEN 30 MG/ML ~~LOC~~ SOAJ
SUBCUTANEOUS | 4 refills | Status: DC
Start: 2021-07-14 — End: 2021-12-17

## 2021-07-14 NOTE — Patient Instructions (Signed)
Your next Berna Bue dose is due on 08/11/21, 09/08/21, and every 8 weeks thereafter (starting on 11/03/2021)  CONTINUE TRELEGY, montelukast as prescribed  Your prescription will be shipped from [place]. Please call to schedule shipment and confirm address after you've used your first injection. They will mail your medication to your home.  You will get a renewal application from [employer] in the next couple of months. If it is December and you have not yet received the application, please call our office at [phone]  You will need to be seen by your provider in 3 to 4 months to assess how Berna Bue is working for you.  How to manage an injection site reaction: Remember the 5 C's: COUNTER - leave on the counter at least 30 minutes but up to overnight to bring medication to room temperature. This may help prevent stinging COLD - place something cold (like an ice gel pack or cold water bottle) on the injection site just before cleansing with alcohol. This may help reduce pain CLARITIN - use Claritin (generic name is loratadine) for the first two weeks of treatment or the day of, the day before, and the day after injecting. This will help to minimize injection site reactions CORTISONE CREAM - apply if injection site is irritated and itching CALL ME - if injection site reaction is bigger than the size of your fist, looks infected, blisters, or if you develop hives   Anaphylactic Reactions  Anaphylactic reactions can occur up to 24 hours after administration.  What are the signs and symptoms of anaphylaxis?  Symptoms can vary from a mild skin reaction to more severe reactions including: Wheezing, shortness of breath, cough, chest tightness or trouble breathing Low blood pressure, dizziness, fainting, rapid of weak heartbeat, anxiety, or feeling of "impending doom" Flushing, itching, hives, or feeling warm Swelling of the throat or tongue, throat tightness, hoarse voice, or trouble  swallowing  Some of these symptoms require immediate treatment, as they can be life threatening.  If you experience severe symptoms which are bolded above use your Epipen and call 911 for immediate emergency care.

## 2021-07-14 NOTE — Progress Notes (Addendum)
HPI Rachael Peck presents today to [facility] with her sister, Dorthy Cooler, to see pharmacy team for Berna Bue new start for severe persistent asthma, HTN, allergic rhinitis, LPR, GERD.  Patient was last seen by Derl Barrow, NP on 06/15/21 with plan to start biologic due to chronic oral corticosteroid use.  Respiratory Medications Current regimen:  Trelegy 100/62.5/25 mcg (1 puff once daily), fexofenadine 180mg  once daily, loratadine 10mg  daily prn, methyprednisolone 2mg  every other day, monteulkast 10mg  nightly Patient reports adherence challenges - cost of Trelegy is unaffordable but has never pursued patient assistance  OBJECTIVE No Known Allergies  Outpatient Encounter Medications as of 07/14/2021  Medication Sig Note   albuterol (PROVENTIL) (2.5 MG/3ML) 0.083% nebulizer solution INHALE THE CONTENTS OF 1 VIAL VIA NEBULIZER TWICE DAILY    albuterol (VENTOLIN HFA) 108 (90 Base) MCG/ACT inhaler INHALE 1 TO 2 PUFFS EVERY 4 TO 6 HOURS AS NEEDED    amLODipine (NORVASC) 10 MG tablet Take 5 mg by mouth daily.    ARTIFICIAL TEARS ophthalmic solution As needed for dry eyes    aspirin 81 MG chewable tablet Chew by mouth.    celecoxib (CELEBREX) 200 MG capsule Take 200 mg by mouth as needed.    cholecalciferol (VITAMIN D) 1000 units tablet Take 1,000 Units by mouth daily.    cyclobenzaprine (FLEXERIL) 5 MG tablet Take 5 mg by mouth 3 (three) times daily as needed for muscle spasms (2 to 3 times as needed).    famotidine (PEPCID) 20 MG tablet Take 1 tablet (20 mg total) by mouth daily.    fexofenadine (ALLEGRA) 180 MG tablet Take 1 tablet (180 mg total) by mouth daily. Once a day as needed for allergies    fluticasone (FLONASE) 50 MCG/ACT nasal spray Place 2 sprays into both nostrils daily.    Fluticasone-Umeclidin-Vilant (TRELEGY ELLIPTA) 100-62.5-25 MCG/INH AEPB Inhale 1 puff into the lungs daily.    ipratropium (ATROVENT) 0.02 % nebulizer solution Use twice daily    KLOR-CON SPRINKLE 10  MEQ CR capsule TAKE 2 CAPSULES TWICE DAILY    lisinopril (ZESTRIL) 10 MG tablet Take 10 mg by mouth daily.    loratadine (CLARITIN) 10 MG tablet Take 10 mg by mouth daily as needed.    methylPREDNISolone (MEDROL) 4 MG tablet Take 1/2 to 1 tablet every other day.    metoprolol (LOPRESSOR) 100 MG tablet Take 100 mg by mouth daily.    montelukast (SINGULAIR) 10 MG tablet Take 1 tablet (10 mg total) by mouth at bedtime.    naproxen (NAPROSYN) 500 MG tablet Take 500 mg by mouth 3 (three) times daily with meals.    pantoprazole (PROTONIX) 40 MG tablet TAKE 1 TABLET DAILY  30 MINUTES PRIOR TO BREAKFAST    promethazine-dextromethorphan (PROMETHAZINE-DM) 6.25-15 MG/5ML syrup Take 5 mLs by mouth 4 (four) times daily as needed for cough.    traZODone (DESYREL) 50 MG tablet Take 50 mg by mouth at bedtime. For sleep    vitamin B-12 (CYANOCOBALAMIN) 1000 MCG tablet Take 1,000 mcg by mouth daily. 03/29/2016: Received from: External Pharmacy Received Sig:    [DISCONTINUED] doxycycline (VIBRA-TABS) 100 MG tablet Take 1 tablet (100 mg total) by mouth 2 (two) times daily.    [DISCONTINUED] Fluticasone-Umeclidin-Vilant (TRELEGY ELLIPTA) 100-62.5-25 MCG/INH AEPB Inhale 1 puff into the lungs daily.    No facility-administered encounter medications on file as of 07/14/2021.     Immunization History  Administered Date(s) Administered   Influenza Split 08/14/2011, 07/11/2012, 07/16/2013, 07/24/2014, 07/16/2017   Influenza, High Dose  Seasonal PF 07/30/2015, 07/18/2016, 07/14/2017, 07/26/2018, 08/06/2019   Influenza-Unspecified 06/30/2015   PFIZER(Purple Top)SARS-COV-2 Vaccination 11/12/2019, 12/03/2019, 06/01/2020   Pneumococcal Conjugate-13 01/12/2011   Pneumococcal Polysaccharide-23 08/02/2013     PFTs No flowsheet data found.   Eosinophils Most recent blood eosinophil count was 300 cells/microL taken on 06/15/21.   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 IL-5 is the major cytokine responsible for the growth and differentiation, recruitment, activation, and survival of eosinophils (a cell type associated with inflammation and an important component in the pathogenesis of asthma) Reviewed that Berna Bue is add-on medication and patient must continue maintenance inhaler regimen. Response to therapy: may take 4 months to determine efficacy. Discussed that patients generally feel improvement sooner than 4 months.  Side effects: antibody development (13%), headache (8%), pharyngitis (5%), injection site reaction (2.2%)  Dose: 30 mg subcutaneously at Week 0, Week 4, Week 8, then 30mg  every 8 weeks thereafter  Administration/Storage:   Reviewed administration sites of thigh or abdomen (at least 2-3 inches away from abdomen). Reviewed the upper arm is only appropriate if caregiver is administering injection  Do not shake the pen/syringe as this could lead to product foaming or precipitation. \ Reviewed storage of medication in refrigerator. Reviewed that Berna Bue can be stored at room temperature in unopened carton for up to 14 days.  Side effects: headache (19%), injection site reaction (7-15%), antibody development (6%), backache (5%), fatigue (5%)  Access: Approval of Fasenra through: patient assistance through 10/16/21. She is aware that company will send patient assistance application renewal form to her home in October-November and that she should complete and return to office.  Administered Fasenra 30mg /ml autoinjector sample pen into right thigh: NDC: [address]-14 Lot: [id number] Exp: 12/2022  Monitored for 30 minutes without any adverse effect noted or self-reported by patient.  Medication Reconciliation  A drug regimen assessment was performed, including review of allergies, interactions,  disease-state management, dosing and immunization history. Medications were reviewed with the patient, including name, instructions, indication, goals of therapy, potential side effects, importance of adherence, and safe use.  Drug interaction(s): none noted  Duplicate meds and outdated doxycycline rx removed from her med list today.  Immunizations  Patient is indicated for the influenzae, pneumonia, and shingles vaccinations. Patient has received 3 COVID19 vaccines. Has been advised that she does not have to time admin of vaccines around [facility] due date.  PLAN Continue Fasenra 30mg  at Week 4 on 08/11/21 and Week 8 on 09/08/21. Then continue Fasenra 30mg  every 8 weeks thereafter starting on 11/03/21. Rx sent to: [facility] (pharmacy contracted with [employer]).  Patient has alreadt received shipment of medication at home (one pen only). Has been advised to call pharmacy for refill when she uses the dose at home. She was provided with printed calendar with Berna Bue dose due dates through September 2023. She reported that Trelegy is expensive for her. Provided her with one sample today.  Patient provided with [employer] patient assistance application today and advised that she will qualify if she has spent $600 OOP for rx this calendar year. Has been advised to complete application and also collect OOP expenditure form from local pharmacy and bring to appt. Advised that she if she does not qualify for patient assistance then could potentially pursue Breztri (another triple inhaler) through patient assistance Continue maintenance asthma regimen of:  Trelegy 100/62.5/25 mcg (1 puff once daily), fexofenadine 180mg  once daily, loratadine 10mg  daily prn, methyprednisolone 2mg  every other day, monteulkast 10mg  nightly Faxed signed Notice of Privacy Practices and Acknowledgement to [facility] per request of patient.  Patient provided with copy of form to take home for her own record  All questions encouraged and answered.   Instructed patient to reach out with any further questions or concerns.  Thank you for allowing pharmacy to participate in this patient's care.  This appointment required 60 minutes of patient care (this includes precharting, chart review, review of results, face-to-face care, etc.).  Knox Saliva, PharmD, MPH, BCPS Clinical Pharmacist (Rheumatology and Pulmonology)

## 2021-08-03 ENCOUNTER — Telehealth: Payer: Self-pay | Admitting: Pharmacist

## 2021-08-03 NOTE — Telephone Encounter (Signed)
Patient called stating that since starting Fasenra, she has had increased inflammation, increased cough - but is inquiring to see if it is Berna Bue causing this or some unknown trigger.  She states that she knows she was coughing before starting Berna Bue but not as much as she has in the past few weeks. She has only received one dose of Fasenra on 07/14/21 with me and is curious if Berna Bue would be causing cough and increased inflammation. I reviewed that this is an unlikely side effect, and in fact, the opposite of what we would expect once she is stable and responsive to [facility] therapy. I reviewed that she may not have improvement from [place] after just one dose.  She did have ED visit at [facility] on 07/31/21 and was prescribed 5-day course of methylprednisolone 4mg .  She is due for Fasenra Week 4 dose on 08/11/21 (has appt with Dr. Valeta Harms on 08/11/21 as well) and will plan to take Fasenra as scheduled but likely after her appointment and is at home.  No other issues in terms of injection site reaction after her first dose.  Routing to Dr. Valeta Harms as Shon Millet, PharmD, MPH, BCPS Clinical Pharmacist (Rheumatology and Pulmonology)

## 2021-08-11 ENCOUNTER — Ambulatory Visit (INDEPENDENT_AMBULATORY_CARE_PROVIDER_SITE_OTHER): Payer: Medicare HMO | Admitting: Pulmonary Disease

## 2021-08-11 ENCOUNTER — Encounter: Payer: Self-pay | Admitting: Pulmonary Disease

## 2021-08-11 ENCOUNTER — Other Ambulatory Visit: Payer: Self-pay

## 2021-08-11 VITALS — BP 132/78 | HR 97 | Temp 97.8°F | Ht 67.0 in | Wt 192.0 lb

## 2021-08-11 DIAGNOSIS — J301 Allergic rhinitis due to pollen: Secondary | ICD-10-CM

## 2021-08-11 DIAGNOSIS — F1920 Other psychoactive substance dependence, uncomplicated: Secondary | ICD-10-CM | POA: Diagnosis not present

## 2021-08-11 DIAGNOSIS — J441 Chronic obstructive pulmonary disease with (acute) exacerbation: Secondary | ICD-10-CM

## 2021-08-11 DIAGNOSIS — Z23 Encounter for immunization: Secondary | ICD-10-CM

## 2021-08-11 DIAGNOSIS — J309 Allergic rhinitis, unspecified: Secondary | ICD-10-CM

## 2021-08-11 DIAGNOSIS — J4550 Severe persistent asthma, uncomplicated: Secondary | ICD-10-CM

## 2021-08-11 DIAGNOSIS — J449 Chronic obstructive pulmonary disease, unspecified: Secondary | ICD-10-CM | POA: Diagnosis not present

## 2021-08-11 MED ORDER — TRELEGY ELLIPTA 100-62.5-25 MCG/ACT IN AEPB: 1.0000 | INHALATION_SPRAY | Freq: Every day | RESPIRATORY_TRACT | 11 refills | Status: DC

## 2021-08-11 NOTE — Progress Notes (Signed)
Synopsis: Referred in Jan 2021 for asthma/SOB by Copper, Virginia [place], PA-C  Subjective:   PATIENT ID: Rachael Peck GENDER: female DOB: [birth year], MRN: [id number]  Chief Complaint  Patient presents with   Follow-up    Follow up COPD    This is a 75 year old female past medical history of allergic rhinitis, seasonal allergies.  Has had a longstanding history of shortness of breath and respiratory complaints diagnosed with obstructive asthma.  Prior pulmonary function tests with reduced ratio and reduced FEV1 FVC.  Additionally she is overweight BMI of 31.  She does have hypertension.  She has been maintained on oral prednisone for greater than 10 years.  She has been able to quit on occasions but had much difficulty tapering off due to recurrent exacerbations.  At this point she is taking a half a tablet every other day at 2 mg every other day.  From a respiratory standpoint she does have occasional coughing fits.  Sometimes sputum production.  She does have wheezing.  Today in the office she also has a wheezing.  Prior IgE in 2017 low, prior regional allergy panel negative.  Here today to establish care with new pulmonary provider.  OV 02/12/2020: overall doing well today. Still on 51m prednisone everyother day. Liked using wixela better than breztri. She would like to go back to it.  Overall respiratory symptoms are stable at this time.  Denies cough sputum production.  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  This has been going on for some time it does limit her mobility.  She does need refills today of medications as she ran out of all of them.  OV 08/17/2020: saw BW on the 5th. Started on prednisone and antibiotics for a flare.  Patient doing well at this time however does not feel like she is fully recovered.  At this point was taking 2 mg of prednisone every other day.  She is interested in going to at least once per day.  She is currently using her inhaler regimen and as  prescribed.  At this time currently on [place]  She is not on a triple therapy regimen at the moment.  We discussed changing her inhalers.  She gets very anxious with the concept of switching her medications around as she has been stable for several years up until her recent exacerbation which does seem to have frightened her some she is improving however.  02/24/2021: Here today for follow-up of her asthma.  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  She has been on oral prednisone for greater than 10 years.  When we initially met back in 2021 we work to try to come off of prednisone however she continued to have flareups.  And she has significant anxiety about coming off of prednisone so she feels like she must take it.  At 1 point time she was at least on 2 mg every other day.  At this point she is back to 4 mg a day.  She also routinely self medicates with her own regiment of prednisone.  She does however felt better after being placed on the Trelegy.  She has lots of concerns about needing additional blood pressure medications because her blood pressure is now higher than it was before.  She is concerned about being started on lisinopril due to the side effects that she read about.  Therefore she is still only on just 2 blood pressure medications and did not  start the third 1 as recommended by her cardiologist.  Today we had a long discussion about what happens when you take daily prednisone for many many years.  I think she now understands that her hypertension and diabetes and obesity are related to her being on prednisone for so many years.  I do not think that she can sustain on taking prednisone forever.  We have discussed the potential use and transition to [employer] again today to try to come off of prednisone use as well as changing her inhaler regimen she was at least open to changing her inhaler regimen last time and does see the benefit of staying on Trelegy at this point but  is concerned that her new occasional bouts of diarrhea may be related to the Trelegy.  08/11/2021: Patient had recent ER visit for chest tightness shortness of breath and wheezing.  Recently started her Fasenra injections.  She did stop her prednisone.  She had a lot of chest tightness coughing sputum production.  It lasted for several days.  She felt better after the increase to prednisone.  Now she is back on 4 mg of prednisone daily.  She is only had 1 Fasenra injection.  She has her next one scheduled for tomorrow.   Past Medical History:  Diagnosis Date   Allergic rhinitis    Anxiety    Chest pain, atypical    Chronic obstructive asthma, unspecified    Diverticulosis of colon    GERD (gastroesophageal reflux disease)    Hypertension    Overweight(278.02)    Palpitations    Stress incontinence, female      Family History  Problem Relation Age of Onset   Kidney failure Mother    Kidney failure Father      Past Surgical History:  Procedure Laterality Date   ABDOMINAL HYSTERECTOMY     BREAST LUMPECTOMY  12/17/2012 & 01/14/2013    Social History   Socioeconomic History   Marital status: Single    Spouse name: Not on file   Number of children: 1   Years of education: Not on file   Highest education level: Not on file  Occupational History   Not on file  Tobacco Use   Smoking status: Never   Smokeless tobacco: Never  Vaping Use   Vaping Use: Never used  Substance and Sexual Activity   Alcohol use: No    Alcohol/week: 0.0 standard drinks   Drug use: No   Sexual activity: Not on file  Other Topics Concern   Not on file  Social History Narrative   Not on file   Social Determinants of Health   Financial Resource Strain: Not on file  Food Insecurity: Not on file  Transportation Needs: Not on file  Physical Activity: Not on file  Stress: Not on file  Social Connections: Not on file  Intimate Partner Violence: Not on file     No Known Allergies   Outpatient  Medications Prior to Visit  Medication Sig Dispense Refill   albuterol (PROVENTIL) (2.5 MG/3ML) 0.083% nebulizer solution INHALE THE CONTENTS OF 1 VIAL VIA NEBULIZER TWICE DAILY 540 mL 2   albuterol (VENTOLIN HFA) 108 (90 Base) MCG/ACT inhaler INHALE 1 TO 2 PUFFS EVERY 4 TO 6 HOURS AS NEEDED 6.7 g 11   amLODipine (NORVASC) 10 MG tablet Take 5 mg by mouth daily.     ARTIFICIAL TEARS ophthalmic solution As needed for dry eyes     aspirin 81 MG chewable tablet Chew by  mouth.     Benralizumab (FASENRA PEN) 30 MG/ML SOAJ Inject 44m (one pen) into the skin at Week 4 (08/11/21), Week 8 (09/08/21), and every 8 weeks thereafter 1 mL 4   celecoxib (CELEBREX) 200 MG capsule Take 200 mg by mouth as needed.     cholecalciferol (VITAMIN D) 1000 units tablet Take 1,000 Units by mouth daily.     cyclobenzaprine (FLEXERIL) 5 MG tablet Take 5 mg by mouth 3 (three) times daily as needed for muscle spasms (2 to 3 times as needed).     famotidine (PEPCID) 20 MG tablet Take 1 tablet (20 mg total) by mouth daily. 90 tablet 3   fexofenadine (ALLEGRA) 180 MG tablet Take 1 tablet (180 mg total) by mouth daily. Once a day as needed for allergies 90 tablet 3   fluticasone (FLONASE) 50 MCG/ACT nasal spray Place 2 sprays into both nostrils daily. 16 g 3   Fluticasone-Umeclidin-Vilant (TRELEGY ELLIPTA) 100-62.5-25 MCG/INH AEPB Inhale 1 puff into the lungs daily. 60 each 0   ipratropium (ATROVENT) 0.02 % nebulizer solution Use twice daily 675 mL 3   KLOR-CON SPRINKLE 10 MEQ CR capsule TAKE 2 CAPSULES TWICE DAILY 360 capsule 2   lisinopril (ZESTRIL) 10 MG tablet Take 10 mg by mouth daily.     loratadine (CLARITIN) 10 MG tablet Take 10 mg by mouth daily as needed.     methylPREDNISolone (MEDROL) 4 MG tablet Take 1/2 to 1 tablet every other day. 45 tablet 3   metoprolol (LOPRESSOR) 100 MG tablet Take 100 mg by mouth daily.     montelukast (SINGULAIR) 10 MG tablet Take 1 tablet (10 mg total) by mouth at bedtime. 90 tablet 3    naproxen (NAPROSYN) 500 MG tablet Take 500 mg by mouth 3 (three) times daily with meals.     pantoprazole (PROTONIX) 40 MG tablet TAKE 1 TABLET DAILY  30 MINUTES PRIOR TO BREAKFAST 90 tablet 0   promethazine-dextromethorphan (PROMETHAZINE-DM) 6.25-15 MG/5ML syrup Take 5 mLs by mouth 4 (four) times daily as needed for cough. 180 mL 0   traZODone (DESYREL) 50 MG tablet Take 50 mg by mouth at bedtime. For sleep     vitamin B-12 (CYANOCOBALAMIN) 1000 MCG tablet Take 1,000 mcg by mouth daily.     No facility-administered medications prior to visit.    Review of Systems  Constitutional:  Negative for chills, fever, malaise/fatigue and weight loss.  HENT:  Negative for hearing loss, sore throat and tinnitus.   Eyes:  Negative for blurred vision and double vision.  Respiratory:  Positive for cough, sputum production, shortness of breath and wheezing. Negative for hemoptysis and stridor.   Cardiovascular:  Negative for chest pain, palpitations, orthopnea, leg swelling and PND.  Gastrointestinal:  Negative for abdominal pain, constipation, diarrhea, heartburn, nausea and vomiting.  Genitourinary:  Negative for dysuria, hematuria and urgency.  Musculoskeletal:  Negative for joint pain and myalgias.  Skin:  Negative for itching and rash.  Neurological:  Negative for dizziness, tingling, weakness and headaches.  Endo/Heme/Allergies:  Negative for environmental allergies. Does not bruise/bleed easily.  Psychiatric/Behavioral:  Negative for depression. The patient is not nervous/anxious and does not have insomnia.   All other systems reviewed and are negative.  Objective:  Physical Exam Vitals reviewed.  Constitutional:      General: She is not in acute distress.    Appearance: She is well-developed. She is obese.  HENT:     Head: Normocephalic and atraumatic.  Eyes:     General:  No scleral icterus.    Conjunctiva/sclera: Conjunctivae normal.     Pupils: Pupils are equal, round, and reactive to  light.  Neck:     Vascular: No JVD.     Trachea: No tracheal deviation.  Cardiovascular:     Rate and Rhythm: Normal rate and regular rhythm.     Heart sounds: Normal heart sounds. No murmur heard. Pulmonary:     Effort: Pulmonary effort is normal. No tachypnea, accessory muscle usage or respiratory distress.     Breath sounds: Normal breath sounds. No stridor. No wheezing, rhonchi or rales.  Abdominal:     General: Bowel sounds are normal. There is no distension.     Palpations: Abdomen is soft.     Tenderness: There is no abdominal tenderness.  Musculoskeletal:        General: No tenderness.     Cervical back: Neck supple.  Lymphadenopathy:     Cervical: No cervical adenopathy.  Skin:    General: Skin is warm and dry.     Capillary Refill: Capillary refill takes less than 2 seconds.     Findings: No rash.  Neurological:     Mental Status: She is alert and oriented to person, place, and time.  Psychiatric:        Behavior: Behavior normal.     Vitals:   08/11/21 1412  BP: 132/78  Pulse: 97  Temp: 97.8 F (36.6 C)  TempSrc: Oral  SpO2: 99%  Weight: 192 lb (87.1 kg)  Height: _0  (1.702 m)   99% on RA BMI Readings from Last 3 Encounters:  08/11/21 30.07 kg/m  06/15/21 30.79 kg/m  02/24/21 30.70 kg/m   Wt Readings from Last 3 Encounters:  08/11/21 192 lb (87.1 kg)  06/15/21 196 lb 9.6 oz (89.2 kg)  02/24/21 196 lb (88.9 kg)   December 13, 2018: Care everywhere [facility] results reviewed Influenza a H1 N1 detected positive, reviewed 11/13/2019 office visit   Chest Imaging: Chest x-ray 11/09/2017 hyperinflated lung fields no infiltrate. The patient's images have been independently reviewed by me.    Pulmonary Functions Testing Results: No flowsheet data found.  FeNO: None   Pathology: None   Echocardiogram: 12/24/2018 echo [facility] care everywhere result, LVEF 65 to 70%  Heart Catheterization: None     Assessment & Plan:     ICD-10-CM   1.  Severe persistent asthma without complication  H60.73     2. Chronic obstructive airway disease with asthma ([place])  J44.9     3. Steroid dependence ([facility])  F19.20     4. Non-seasonal allergic rhinitis due to pollen  J30.1     5. Allergic rhinitis, unspecified seasonality, unspecified trigger  J30.9     6. COPD with acute exacerbation ([place])  J44.1       Assessment:   This is a 75 year old female, BMI 30, chronic obstructive pulmonary disease, asthma COPD overlap, steroid dependence, seasonal allergies with allergic rhinitis.  She has been on and off steroids for 10+ years.  Now back on 4 mg daily after having a recent exacerbation with an increase in prednisone dosing.  She stopped her prednisone relatively rapidly after starting her first Fasenra injection.  I think she had a flare after this.  She is doing much better now.  Plan: Continue 4 mg prednisone daily Continue Trelegy Financial aid application filled out and submitted with [employer] to help with inhaler cost. She can continue her daily prednisone until her third Fasenra injection which  is scheduled for November. After that she can drop to every other day.  We will slowly taper her over time and try to get her off if possible. Patient is agreeable to this plan. Return to clinic in 3 months or as needed.   Garner Nash, DO [facility] 08/11/2021 2:25 PM

## 2021-08-11 NOTE — Addendum Note (Signed)
Addended by: Fran Lowes on: 08/11/2021 03:52 PM   Modules accepted: Orders

## 2021-08-11 NOTE — Patient Instructions (Signed)
Thank you for visiting Dr. Valeta Harms at [facility]. Today we recommend the following:  Stay on 4mg  prednisone until nov fasenra injection and then go to every other day.   Return in about 3 months (around 11/11/2021) for with APP or Dr. Valeta Harms.    Please do your part to reduce the spread of COVID-19.

## 2021-09-07 ENCOUNTER — Telehealth: Payer: Self-pay | Admitting: Pulmonary Disease

## 2021-09-08 NOTE — Telephone Encounter (Signed)
Returned call to [facility] regarding Fasenra rx. Advised that patient is due for [facility] today.  Per [facility], they will not be able to get medication to patient until earliest Tuesday, 09/14/21. Called patient to notify. She verbalized understanding.  Knox Saliva, PharmD, MPH, BCPS Clinical Pharmacist (Rheumatology and Pulmonology)

## 2021-09-20 ENCOUNTER — Telehealth: Payer: Self-pay | Admitting: Pulmonary Disease

## 2021-09-20 MED ORDER — FAMOTIDINE 20 MG PO TABS: 20.0000 mg | ORAL_TABLET | Freq: Every day | ORAL | 3 refills | Status: DC

## 2021-09-20 MED ORDER — FLUTICASONE PROPIONATE 50 MCG/ACT NA SUSP: 2.0000 | Freq: Every day | NASAL | 3 refills | Status: DC

## 2021-09-20 MED ORDER — METHYLPREDNISOLONE 4 MG PO TABS: ORAL_TABLET | ORAL | 3 refills | Status: DC

## 2021-09-20 MED ORDER — ALBUTEROL SULFATE HFA 108 (90 BASE) MCG/ACT IN AERS: INHALATION_SPRAY | RESPIRATORY_TRACT | 11 refills | Status: DC

## 2021-09-20 MED ORDER — ALBUTEROL SULFATE (2.5 MG/3ML) 0.083% IN NEBU: INHALATION_SOLUTION | RESPIRATORY_TRACT | 2 refills | Status: DC

## 2021-09-20 NOTE — Telephone Encounter (Signed)
Refills have been sent to the pharmacy per pts request.   

## 2021-10-04 ENCOUNTER — Telehealth: Payer: Self-pay | Admitting: Pulmonary Disease

## 2021-10-04 MED ORDER — AMOXICILLIN-POT CLAVULANATE 875-125 MG PO TABS: 1.0000 | ORAL_TABLET | Freq: Two times a day (BID) | ORAL | 0 refills | Status: DC

## 2021-10-04 NOTE — Telephone Encounter (Signed)
Spoke with pt and verified pharmacy along with medication instructions. Augmentin was called in. Nothing further needed at this time.

## 2021-10-04 NOTE — Telephone Encounter (Signed)
I called the patient and she reports that she has cough that is productive that is going from yellow to brown sputum and she is feeling more shortness of breath in the last 2 days. She felt warm but has not taken a temperature. She has not had any other symptoms and does not get around people in general. She feels like she may need an ABT.   She has not had not been exposed to anyone that is sick. She is on Trelegy and is taking her neublizer, and taking her Bryson Corona every other month.  Please advise.

## 2021-10-28 ENCOUNTER — Other Ambulatory Visit: Payer: Self-pay | Admitting: *Deleted

## 2021-10-28 MED ORDER — FAMOTIDINE 20 MG PO TABS: 20.0000 mg | ORAL_TABLET | Freq: Every day | ORAL | 3 refills | Status: DC

## 2021-11-12 ENCOUNTER — Encounter: Payer: Self-pay | Admitting: Primary Care

## 2021-11-12 ENCOUNTER — Ambulatory Visit (INDEPENDENT_AMBULATORY_CARE_PROVIDER_SITE_OTHER): Payer: Medicare HMO | Admitting: Primary Care

## 2021-11-12 ENCOUNTER — Other Ambulatory Visit: Payer: Self-pay

## 2021-11-12 VITALS — BP 128/70 | HR 90 | Temp 98.0°F | Ht 66.0 in | Wt 192.0 lb

## 2021-11-12 DIAGNOSIS — J449 Chronic obstructive pulmonary disease, unspecified: Secondary | ICD-10-CM | POA: Diagnosis not present

## 2021-11-12 DIAGNOSIS — J4550 Severe persistent asthma, uncomplicated: Secondary | ICD-10-CM

## 2021-11-12 LAB — CBC WITH DIFFERENTIAL/PLATELET
Basophils Absolute: 0 10*3/uL (ref 0.0–0.1)
Basophils Relative: 0.2 % (ref 0.0–3.0)
Eosinophils Absolute: 0 10*3/uL (ref 0.0–0.7)
Eosinophils Relative: 0 % (ref 0.0–5.0)
HCT: 35.1 % — ABNORMAL LOW (ref 36.0–46.0)
Hemoglobin: 11.5 g/dL — ABNORMAL LOW (ref 12.0–15.0)
Lymphocytes Relative: 24 % (ref 12.0–46.0)
Lymphs Abs: 0.8 10*3/uL (ref 0.7–4.0)
MCHC: 32.7 g/dL (ref 30.0–36.0)
MCV: 86 fl (ref 78.0–100.0)
Monocytes Absolute: 0.4 10*3/uL (ref 0.1–1.0)
Monocytes Relative: 12.2 % — ABNORMAL HIGH (ref 3.0–12.0)
Neutro Abs: 2.1 10*3/uL (ref 1.4–7.7)
Neutrophils Relative %: 63.6 % (ref 43.0–77.0)
Platelets: 288 10*3/uL (ref 150.0–400.0)
RBC: 4.08 Mil/uL (ref 3.87–5.11)
RDW: 14.2 % (ref 11.5–15.5)
WBC: 3.4 10*3/uL — ABNORMAL LOW (ref 4.0–10.5)

## 2021-11-12 NOTE — Progress Notes (Signed)
_0  ID: Rachael Peck, female    DOB: 28-Jul-1946, 76 y.o.   MRN: [id number]  Chief Complaint  Patient presents with   Follow-up    Follow up. Pt states that her astham ia doing ok today but over all she is not so good. Her asthma is not well.     Referring provider: Copper, Virginia Rochester, PA-C   Brief summary:  This is a 76 year old female past medical history of allergic rhinitis, seasonal allergies.  Has had a longstanding history of shortness of breath and respiratory complaints diagnosed with obstructive asthma.  Prior pulmonary function tests with reduced ratio and reduced FEV1 FVC.  Additionally she is overweight BMI of 31.  She does have hypertension.  She has been maintained on oral prednisone for greater than 10 years.  She has been able to quit on occasions but had much difficulty tapering off due to recurrent exacerbations.  At this point she is taking a half a tablet every other day at 2 mg every other day.  From a respiratory standpoint she does have occasional coughing fits.  Sometimes sputum production.  She does have wheezing.  Today in the office she also has a wheezing.  Prior IgE in 2017 low, prior regional allergy panel negative.  Here today to establish care with new pulmonary provider.  HPI: 76 year old female, never smoked. Followed in our office for hx severe persistent asthma, LPR, chronic obstructive airway disease, allergic rhinitis. Patient of Dr. Valeta Harms , last seen on 08/11/21.   Previous [facility] PULMONARY ENCOUNTER:  OV 02/12/2020: overall doing well today. Still on 30m prednisone everyother day. Liked using wixela better than breztri. She would like to go back to it.  Overall respiratory symptoms are stable at this time.  Denies cough sputum production.  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  This has been going on for some time it does limit her mobility.  She does need refills today of medications as she ran out of all of them.  OV 08/17/2020: saw BW on  the 5th. Started on prednisone and antibiotics for a flare.  Patient doing well at this time however does not feel like she is fully recovered.  At this point was taking 2 mg of prednisone every other day.  She is interested in going to at least once per day.  She is currently using her inhaler regimen and as prescribed.  At this time currently on [place]  She is not on a triple therapy regimen at the moment.  We discussed changing her inhalers.  She gets very anxious with the concept of switching her medications around as she has been stable for several years up until her recent exacerbation which does seem to have frightened her some she is improving however.  02/24/2021: Here today for follow-up of her asthma.  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  She has been on oral prednisone for greater than 10 years.  When we initially met back in 2021 we work to try to come off of prednisone however she continued to have flareups.  And she has significant anxiety about coming off of prednisone so she feels like she must take it.  At 1 point time she was at least on 2 mg every other day.  At this point she is back to 4 mg a day.  She also routinely self medicates with her own regiment of prednisone.  She does however felt better  after being placed on the Trelegy.  She has lots of concerns about needing additional blood pressure medications because her blood pressure is now higher than it was before.  She is concerned about being started on lisinopril due to the side effects that she read about.  Therefore she is still only on just 2 blood pressure medications and did not start the third 1 as recommended by her cardiologist.  Today we had a long discussion about what happens when you take daily prednisone for many many years.  I think she now understands that her hypertension and diabetes and obesity are related to her being on prednisone for so many years.  I do not think that she can sustain on  taking prednisone forever.  We have discussed the potential use and transition to [facility] again today to try to come off of prednisone use as well as changing her inhaler regimen she was at least open to changing her inhaler regimen last time and does see the benefit of staying on Trelegy at this point but is concerned that her new occasional bouts of diarrhea may be related to the Trelegy.  08/11/2021: Patient had recent ER visit for chest tightness shortness of breath and wheezing.  Recently started her Fasenra injections.  She did stop her prednisone.  She had a lot of chest tightness coughing sputum production.  It lasted for several days.  She felt better after the increase to prednisone.  Now she is back on 4 mg of prednisone daily.  She is only had 1 Fasenra injection.  She has her next one scheduled for tomorrow.   11/12/2021- Interim hx  Patient presents today for 3 month follow-up/severe persistent asthma/COPD overlap. Previously steroid dependent. She was started on Fasenra back in September 2022, self stopped prednisone and had flare. She was told to resume 81m prednisone taper. Maintained on Trelegy 1046m. She has has not seen much of an improvement with Fasenra injections. She does state it does take less effort to cough mucus up since starting. She would like to stay on medication a little while longer. She still gets out of breath with exertion. She was treated for bronchitis in December 2022with Augmentin. She is compliant with trelegy daily. She is not consistently taking medrol 2-94m33mvery other day.    No Known Allergies  Immunization History  Administered Date(s) Administered   Fluad Quad(high Dose 65+) 08/11/2021   Influenza Split 08/14/2011, 07/11/2012, 07/16/2013, 07/24/2014, 07/16/2017   Influenza, High Dose Seasonal PF 07/30/2015, 07/18/2016, 07/14/2017, 07/26/2018, 08/06/2019   Influenza-Unspecified 06/30/2015   PFIZER(Purple Top)SARS-COV-2 Vaccination 11/12/2019,  12/03/2019, 06/01/2020   Pfizer Covid-19 Vaccine Bivalent Booster [id number] 09/03/2021   Pneumococcal Conjugate-13 01/12/2011   Pneumococcal Polysaccharide-23 08/02/2013    Past Medical History:  Diagnosis Date   Allergic rhinitis    Anxiety    Chest pain, atypical    Chronic obstructive asthma, unspecified    Diverticulosis of colon    GERD (gastroesophageal reflux disease)    Hypertension    Overweight(278.02)    Palpitations    Stress incontinence, female     Tobacco History: Social History   Tobacco Use  Smoking Status Never  Smokeless Tobacco Never   Counseling given: Not Answered   Outpatient Medications Prior to Visit  Medication Sig Dispense Refill   albuterol (PROVENTIL) (2.5 MG/3ML) 0.083% nebulizer solution INHALE THE CONTENTS OF 1 VIAL VIA NEBULIZER TWICE DAILY 540 mL 2   albuterol (VENTOLIN HFA) 108 (90 Base) MCG/ACT inhaler INHALE  1 TO 2 PUFFS EVERY 4 TO 6 HOURS AS NEEDED 6.7 g 11   amLODipine (NORVASC) 10 MG tablet Take 5 mg by mouth daily.     ARTIFICIAL TEARS ophthalmic solution As needed for dry eyes     aspirin 81 MG chewable tablet Chew by mouth.     Benralizumab (FASENRA PEN) 30 MG/ML SOAJ Inject 90m (one pen) into the skin at Week 4 (08/11/21), Week 8 (09/08/21), and every 8 weeks thereafter 1 mL 4   celecoxib (CELEBREX) 200 MG capsule Take 200 mg by mouth as needed.     cholecalciferol (VITAMIN D) 1000 units tablet Take 1,000 Units by mouth daily.     cyclobenzaprine (FLEXERIL) 5 MG tablet Take 5 mg by mouth 3 (three) times daily as needed for muscle spasms (2 to 3 times as needed).     famotidine (PEPCID) 20 MG tablet Take 1 tablet (20 mg total) by mouth daily. 90 tablet 3   fexofenadine (ALLEGRA) 180 MG tablet Take 1 tablet (180 mg total) by mouth daily. Once a day as needed for allergies 90 tablet 3   fluticasone (FLONASE) 50 MCG/ACT nasal spray Place 2 sprays into both nostrils daily. 16 g 3   Fluticasone-Umeclidin-Vilant (TRELEGY ELLIPTA)  100-62.5-25 MCG/ACT AEPB Inhale 1 puff into the lungs daily. 60 each 11   Fluticasone-Umeclidin-Vilant (TRELEGY ELLIPTA) 100-62.5-25 MCG/INH AEPB Inhale 1 puff into the lungs daily. 60 each 0   ipratropium (ATROVENT) 0.02 % nebulizer solution Use twice daily 675 mL 3   KLOR-CON SPRINKLE 10 MEQ CR capsule TAKE 2 CAPSULES TWICE DAILY 360 capsule 2   lisinopril (ZESTRIL) 10 MG tablet Take 10 mg by mouth daily.     loratadine (CLARITIN) 10 MG tablet Take 10 mg by mouth daily as needed.     methylPREDNISolone (MEDROL) 4 MG tablet Take 1/2 to 1 tablet every other day. 45 tablet 3   metoprolol (LOPRESSOR) 100 MG tablet Take 100 mg by mouth daily.     montelukast (SINGULAIR) 10 MG tablet Take 1 tablet (10 mg total) by mouth at bedtime. 90 tablet 3   naproxen (NAPROSYN) 500 MG tablet Take 500 mg by mouth 3 (three) times daily with meals.     pantoprazole (PROTONIX) 40 MG tablet TAKE 1 TABLET DAILY  30 MINUTES PRIOR TO BREAKFAST 90 tablet 0   promethazine-dextromethorphan (PROMETHAZINE-DM) 6.25-15 MG/5ML syrup Take 5 mLs by mouth 4 (four) times daily as needed for cough. 180 mL 0   traZODone (DESYREL) 50 MG tablet Take 50 mg by mouth at bedtime. For sleep     vitamin B-12 (CYANOCOBALAMIN) 1000 MCG tablet Take 1,000 mcg by mouth daily.     amoxicillin-clavulanate (AUGMENTIN) 875-125 MG tablet Take 1 tablet by mouth 2 (two) times daily. 14 tablet 0   No facility-administered medications prior to visit.      Review of Systems  Review of Systems  Constitutional: Negative.   HENT:  Positive for congestion.   Respiratory:  Positive for cough and shortness of breath.     Physical Exam  BP 128/70 (BP Location: Left Arm, Patient Position: Sitting, Cuff Size: Normal)    Pulse 90    Temp 98 F (36.7 C) (Oral)    Ht _0  (1.676 m)    Wt 192 lb (87.1 kg)    SpO2 99%    BMI 30.99 kg/m  Physical Exam HENT:     Head: Normocephalic and atraumatic.     Nose: Nose normal.  Mouth/Throat:     Mouth:  Mucous membranes are moist.     Pharynx: Oropharynx is clear.  Cardiovascular:     Rate and Rhythm: Normal rate.  Pulmonary:     Effort: Pulmonary effort is normal.     Breath sounds: Normal breath sounds.  Musculoskeletal:        General: Normal range of motion.  Skin:    General: Skin is warm and dry.  Neurological:     General: No focal deficit present.     Mental Status: She is alert and oriented to person, place, and time. Mental status is at baseline.  Psychiatric:        Mood and Affect: Mood normal.        Behavior: Behavior normal.        Thought Content: Thought content normal.        Judgment: Judgment normal.     Lab Results:  CBC    Component Value Date/Time   WBC 3.4 (L) 11/12/2021 1451   RBC 4.08 11/12/2021 1451   HGB 11.5 (L) 11/12/2021 1451   HCT 35.1 (L) 11/12/2021 1451   PLT 288.0 11/12/2021 1451   MCV 86.0 11/12/2021 1451   MCHC 32.7 11/12/2021 1451   RDW 14.2 11/12/2021 1451   LYMPHSABS 0.8 11/12/2021 1451   MONOABS 0.4 11/12/2021 1451   EOSABS 0.0 11/12/2021 1451   BASOSABS 0.0 11/12/2021 1451    BMET    Component Value Date/Time   NA 141 05/26/2009 1235   K 3.1 (L) 05/26/2009 1235   CL 103 05/26/2009 1235   CO2 31 05/26/2009 1235   GLUCOSE 89 05/26/2009 1235   BUN 10 05/26/2009 1235   CREATININE 0.7 05/26/2009 1235   CALCIUM 9.2 05/26/2009 1235   GFRNONAA 109.37 05/26/2009 1235    BNP No results found for: BNP  ProBNP No results found for: PROBNP  Imaging: No results found.   Assessment & Plan:   Severe persistent asthma without complication -Maintained on Trelegy 161mg and Methylprednisolone. She has not seen much of an improvement with Fasenra injections which she started in September 2022. Her FENO remains >150. We will check CBC with diff/ IGE. May need to change Dupixent or Tezspire. She will likely need patient assistance.   Recommendations: - Increased Trelegy 2053m - take one puff daily in the morning - Take  medrol 15m815m1/2 tablet) every day  - Continue Fasenra injections today   Labs: - CBC with diff/IgE  Orders: - FENO   Follow-up: - 3 months with Dr. IcaMarthe PatchP 11/15/2021

## 2021-11-12 NOTE — Patient Instructions (Addendum)
Recommendations: - Increased Trelegy 211mcg - take one puff daily in the morning - Take medrol 2mg  (1/2 tablet) every day  - Continue Fasenra injections today   Labs: - CBC with diff/IgE  Orders: - FENO   Follow-up: - 3 months with Dr. Valeta Harms

## 2021-11-15 ENCOUNTER — Telehealth: Payer: Self-pay | Admitting: Primary Care

## 2021-11-15 ENCOUNTER — Other Ambulatory Visit: Payer: Self-pay | Admitting: Pharmacy Technician

## 2021-11-15 LAB — IGE: IgE (Immunoglobulin E), Serum: 17 kU/L (ref <=114)

## 2021-11-15 NOTE — Progress Notes (Signed)
IgE and Eos were normal. Dr. Valeta Harms would still like to try and change you to a different biologic as you are still having symptoms and FENO was elevated. Her WBC was low-normal, have her follow-up with PCP will need repeat labs

## 2021-11-15 NOTE — Assessment & Plan Note (Signed)
-  Maintained on Trelegy 139mcg and Methylprednisolone. She has not seen much of an improvement with Fasenra injections which she started in September 2022. Her FENO remains >150. We will check CBC with diff/ IGE. May need to change Dupixent or Tezspire. She will likely need patient assistance.   Recommendations: - Increased Trelegy 280mcg - take one puff daily in the morning - Take medrol 2mg  (1/2 tablet) every day  - Continue Fasenra injections today   Labs: - CBC with diff/IgE  Orders: - FENO   Follow-up: - 3 months with Dr. Valeta Harms

## 2021-11-15 NOTE — Telephone Encounter (Signed)
Can we please try and get Tezspire approved for patient. I did not get paperwork signed, I apologize/ She is currently on Fasenra and has seen no improvement in her asthma symptoms. FENO remains >150. Eos/IGE are normal on biologic.

## 2021-11-16 NOTE — Telephone Encounter (Signed)
Ok. Other options would be dupixent.

## 2021-11-17 NOTE — Telephone Encounter (Signed)
Per [employer] rep, there is now pt assistance program. Enrollment forms mailed to patient today. Income threshold for household of one is [phone] and for household of two is $91500. Tezspire was previously approved but was not covered at 100% cost for patient which is why patient did not want to move forward with Tezspire.  I discussed with patient today. She states she does not want to move forward with Tezspire since it must be administered in HCP setting by HCP. She lives in [place] and does not want to drive every month for dose.  She inquired if she needed to stay on biologic at all. I advised that it is clinical recommendation for her asthma since she is on optimized inhalers and asthma treatment especially if she continues to require steroids and has frequent asthma exacerbation. She is open to trial of Dupixent if ok'd by provider. Routing to [employer] and Dr. Hollice Gong, PharmD, MPH, BCPS Clinical Pharmacist (Rheumatology and Pulmonology)

## 2021-11-17 NOTE — Telephone Encounter (Signed)
Yes

## 2021-11-18 ENCOUNTER — Telehealth: Payer: Self-pay | Admitting: Pharmacist

## 2021-11-18 DIAGNOSIS — J4550 Severe persistent asthma, uncomplicated: Secondary | ICD-10-CM

## 2021-11-18 NOTE — Telephone Encounter (Signed)
Submitted a Prior Authorization request to [facility] for [place] via CoverMyMeds. Will update once we receive a response.  Key: [id number]  Will mail [place] pt assistance application to patient today since she has Medicare.  Provider portion placed in Dr. Juline Patch mailbox to be signed.  Knox Saliva, PharmD, MPH, BCPS Clinical Pharmacist (Rheumatology and Pulmonology)

## 2021-11-19 ENCOUNTER — Other Ambulatory Visit (HOSPITAL_COMMUNITY): Payer: Self-pay

## 2021-11-19 NOTE — Telephone Encounter (Signed)
Received notification from [facility] regarding a prior authorization for [place]. Authorization has been APPROVED from 11/18/21 to 10/16/22.   Per test claim, copay for 28 days supply is $575.86  Pending Dupixent[employer]Way PAP paperwork from patient (mailed to her home yesterday) and provider portion (placed in box yesterday)  Knox Saliva, PharmD, MPH, BCPS Clinical Pharmacist (Rheumatology and Pulmonology)

## 2021-11-26 NOTE — Telephone Encounter (Signed)
Returned call to patient regarding Dupixent pt assistance application. Unable to reach - left VM requesting return call to review.  Knox Saliva, PharmD, MPH, BCPS Clinical Pharmacist (Rheumatology and Pulmonology)

## 2021-11-29 NOTE — Telephone Encounter (Signed)
Patient left 2 VM on Friday, 11/26/21. Patient states she will drop off Dupixent PAP application on Wednesday, 12/01/21.  Knox Saliva, PharmD, MPH, BCPS Clinical Pharmacist (Rheumatology and Pulmonology)

## 2021-12-01 NOTE — Telephone Encounter (Signed)
Patient dropped off [place] forms- placed in pharmacy box.

## 2021-12-02 ENCOUNTER — Other Ambulatory Visit: Payer: Self-pay | Admitting: *Deleted

## 2021-12-02 MED ORDER — TRELEGY ELLIPTA 100-62.5-25 MCG/ACT IN AEPB: 1.0000 | INHALATION_SPRAY | Freq: Every day | RESPIRATORY_TRACT | 3 refills | Status: DC

## 2021-12-02 NOTE — Telephone Encounter (Signed)
Spoke with patient and apologized that application was lost. She is willing to fax application to pharmacy team. She will plan to fax application tomorrow from [employer] - provided with direct fax number to pharmacy team. Will await her portion  Still awaiting provider portion from Dr. Hollice Gong, PharmD, MPH, BCPS Clinical Pharmacist (Rheumatology and Pulmonology)

## 2021-12-03 NOTE — Telephone Encounter (Signed)
Submitted Patient Assistance Application to Dupixent MyWay for DUPIXENT along with provider portion, PA and income documents. Will update patient when we receive a response. ° °Fax# [phone] °Phone# [phone] ° °

## 2021-12-13 ENCOUNTER — Other Ambulatory Visit: Payer: Self-pay | Admitting: *Deleted

## 2021-12-13 MED ORDER — FLUTICASONE PROPIONATE 50 MCG/ACT NA SUSP: 2.0000 | Freq: Every day | NASAL | 3 refills | Status: DC

## 2021-12-17 MED ORDER — DUPIXENT 300 MG/2ML ~~LOC~~ SOAJ: SUBCUTANEOUS | 1 refills | Status: DC

## 2021-12-17 NOTE — Telephone Encounter (Signed)
Reached out to [place] for status update on pt PAP application process. Pt HAS contacted program and expressed financial hardship and should be approved to receive Patient Assistance. However a new prescription will be needed to sent in to [facility], as the Rx attached to the enrollment form was incorrectly triaged to [facility] and is now considered unusable by [employer]. ?

## 2021-12-17 NOTE — Telephone Encounter (Signed)
Rx for Dupixent loading dose and maintenance sent to [facility]. Will await approval letter from [place] ? ?Knox Saliva, PharmD, MPH, BCPS ?Clinical Pharmacist (Rheumatology and Pulmonology) ?

## 2021-12-31 NOTE — Telephone Encounter (Signed)
Called Dupixent MyWay for update on patient's application. Received a verbal confirmation from  [place] regarding an approval for [place] patient assistance from 12/31/21 to 10/16/22.  ? ?Phone: [phone], option 1, option 2, option 2 ? ?Will await approval letter then schedule new start visit ? ?Knox Saliva, PharmD, MPH, BCPS ?Clinical Pharmacist (Rheumatology and Pulmonology) ? ?

## 2022-01-03 ENCOUNTER — Other Ambulatory Visit: Payer: Self-pay | Admitting: *Deleted

## 2022-01-03 MED ORDER — IPRATROPIUM BROMIDE 0.02 % IN SOLN: RESPIRATORY_TRACT | 3 refills | Status: DC

## 2022-01-03 NOTE — Telephone Encounter (Addendum)
Received Dupixent MyWay patient assistance approval letter.  Patient is approved from 12/31/21 through 10/16/22.  ? ?Patient states she has [name] shipment arriving on Wednesday, 01/05/22. She has been advised to place medication in refrigerator at home and that we will use sample for dose in clinic ? ?Patient scheduled for Dupixent new start on 01/06/22 ? ?Knox Saliva, PharmD, MPH, BCPS ?Clinical Pharmacist (Rheumatology and Pulmonology) ?

## 2022-01-06 ENCOUNTER — Other Ambulatory Visit: Payer: Self-pay

## 2022-01-06 ENCOUNTER — Ambulatory Visit (INDEPENDENT_AMBULATORY_CARE_PROVIDER_SITE_OTHER): Payer: Medicare HMO | Admitting: Pharmacist

## 2022-01-06 DIAGNOSIS — J4550 Severe persistent asthma, uncomplicated: Secondary | ICD-10-CM

## 2022-01-06 DIAGNOSIS — Z4789 Encounter for other orthopedic aftercare: Secondary | ICD-10-CM

## 2022-01-06 NOTE — Patient Instructions (Signed)
Your next [place] dose is due on 01/20/22, 02/03/22, and every 14 days thereafter ? ?CONTINUE Trelegy 100/62.5/25mcg (1 puff once daily) and montelukast '10mg'$  nightly ? ?Your prescription will be shipped from [facility]. Their phone number is [phone] ?Please call to schedule shipment and confirm address. They will mail your medication to your home. ? ?You will need to be seen by your provider in 3 to 4 months to assess how [place] is working for you. Please ensure you have a follow-up appointment scheduled in June or July 2023. Call our clinic if you need to make this appointment. ? ?How to manage an injection site reaction: ?Remember the 5 C's: ?COUNTER - leave on the counter at least 30 minutes but up to overnight to bring medication to room temperature. This may help prevent stinging ?COLD - place something cold (like an ice gel pack or cold water bottle) on the injection site just before cleansing with alcohol. This may help reduce pain ?CLARITIN - use Claritin (generic name is loratadine) for the first two weeks of treatment or the day of, the day before, and the day after injecting. This will help to minimize injection site reactions ?CORTISONE CREAM - apply if injection site is irritated and itching ?CALL ME - if injection site reaction is bigger than the size of your fist, looks infected, blisters, or if you develop hives  ?

## 2022-01-06 NOTE — Progress Notes (Addendum)
? ?HPI ?Patient presents today to [facility] with her sister to see pharmacy team for [place] new start.  Past medical history includes severe persistent asthma, allergic rhinitis, LPR, GERD. 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 ? ?She is transitioning from [place]. Her last dose was in January 2023 after her OV on 11/12/21. ? ?Respiratory Medications ?Current regimen: Trelegy 100/62.5/25mcg (1 puff once daily), fexofenadine '180mg'$  daily, Flonase daily, loratidine '10mg'$  prn, methlyprednisolone '2mg'$  daily, montelukast '10mg'$  nightly ? ?Patient reports no known adherence challenges ? ?OBJECTIVE ?No Known Allergies ? ?Outpatient Encounter Medications as of 01/06/2022  ?Medication Sig Note  ? methylPREDNISolone (MEDROL) 4 MG tablet Take 1/2 to 1 tablet every other day. (Patient taking differently: Take 2 mg by mouth daily. Take 1/2 to 1 tablet every other day.)   ? albuterol (PROVENTIL) (2.5 MG/3ML) 0.083% nebulizer solution INHALE THE CONTENTS OF 1 VIAL VIA NEBULIZER TWICE DAILY   ? albuterol (VENTOLIN HFA) 108 (90 Base) MCG/ACT inhaler INHALE 1 TO 2 PUFFS EVERY 4 TO 6 HOURS AS NEEDED   ? amLODipine (NORVASC) 10 MG tablet Take 5 mg by mouth daily.   ? ARTIFICIAL TEARS ophthalmic solution As needed for dry eyes   ? aspirin 81 MG chewable tablet Chew by mouth.   ? celecoxib (CELEBREX) 200 MG capsule Take 200 mg by mouth as needed.   ? cholecalciferol (VITAMIN D) 1000 units tablet Take 1,000 Units by mouth daily.   ? cyclobenzaprine (FLEXERIL) 5 MG tablet Take 5 mg by mouth 3 (three) times daily as needed for muscle spasms (2 to 3 times as needed).   ? Dupilumab (DUPIXENT) 300 MG/2ML SOPN Inject '600mg'$  into the skin at Week 0, then '300mg'$  into the skin every 14 days thereafter.   ? famotidine (PEPCID) 20 MG tablet Take 1 tablet (20 mg total) by mouth daily.   ? fexofenadine (ALLEGRA) 180 MG tablet Take 1 tablet (180 mg total) by mouth daily. Once a day as needed for allergies   ? fluticasone (FLONASE) 50  MCG/ACT nasal spray Place 2 sprays into both nostrils daily.   ? Fluticasone-Umeclidin-Vilant (TRELEGY ELLIPTA) 100-62.5-25 MCG/ACT AEPB Inhale 1 puff into the lungs daily.   ? ipratropium (ATROVENT) 0.02 % nebulizer solution Use twice daily   ? KLOR-CON SPRINKLE 10 MEQ CR capsule TAKE 2 CAPSULES TWICE DAILY   ? lisinopril (ZESTRIL) 10 MG tablet Take 10 mg by mouth daily.   ? loratadine (CLARITIN) 10 MG tablet Take 10 mg by mouth daily as needed.   ? metoprolol (LOPRESSOR) 100 MG tablet Take 100 mg by mouth daily.   ? montelukast (SINGULAIR) 10 MG tablet Take 1 tablet (10 mg total) by mouth at bedtime.   ? pantoprazole (PROTONIX) 40 MG tablet TAKE 1 TABLET DAILY  30 MINUTES PRIOR TO BREAKFAST   ? vitamin B-12 (CYANOCOBALAMIN) 1000 MCG tablet Take 1,000 mcg by mouth daily. 03/29/2016: Received from: External Pharmacy Received Sig:   ? [DISCONTINUED] naproxen (NAPROSYN) 500 MG tablet Take 500 mg by mouth 3 (three) times daily with meals.   ? [DISCONTINUED] promethazine-dextromethorphan (PROMETHAZINE-DM) 6.25-15 MG/5ML syrup Take 5 mLs by mouth 4 (four) times daily as needed for cough.   ? [DISCONTINUED] traZODone (DESYREL) 50 MG tablet Take 50 mg by mouth at bedtime. For sleep   ? ?No facility-administered encounter medications on file as of 01/06/2022.  ?  ? ?Immunization History  ?Administered Date(s) Administered  ? Fluad Quad(high Dose 65+) 08/11/2021  ? Influenza Split 08/14/2011, 07/11/2012, 07/16/2013, 07/24/2014,  07/16/2017  ? Influenza, High Dose Seasonal PF 07/30/2015, 07/18/2016, 07/14/2017, 07/26/2018, 08/06/2019  ? Influenza-Unspecified 06/30/2015  ? PFIZER(Purple Top)SARS-COV-2 Vaccination 11/12/2019, 12/03/2019, 06/01/2020  ? Pension scheme manager 37yr & up 09/03/2021  ? Pneumococcal Conjugate-13 01/12/2011  ? Pneumococcal Polysaccharide-23 08/02/2013  ?  ? ?PFTs ?   ? View : No data to display.  ?  ?  ?  ?  ?Eosinophils ?Most recent blood eosinophil count was <50 cells/microL taken on  11/12/21.  ? ?IgE: 17 on 11/12/21 ? ? ?Assessment  ? ?Biologics training for dupilumab (Dupixent) ? ?Goals of therapy: ?Mechanism: human monoclonal IgG4 antibody that inhibits interleukin-4 and interleukin-13 cytokine-induced responses, including release of proinflammatory cytokines, chemokines, and IgE ?Reviewed that Dupixent is add-on medication and patient must continue maintenance inhaler regimen. ?Response to therapy: may take 4 months to determine efficacy. Discussed that patients generally feel improvement sooner than 4 months. ? ?Side effects: injection site reaction (6-18%), antibody development (5-16%), ophthalmic conjunctivitis (2-16%), transient blood eosinophilia (1-2%) ? ?Dose: '600mg'$  at Week 0 (administered today in clinic) followed by '300mg'$  every 14 days thereafter ? ?Administration/Storage:  ?Reviewed administration sites of thigh or abdomen (at least 2-3 inches away from abdomen). Reviewed the upper arm is only appropriate if caregiver is administering injection  ?Do not shake pen/syringe as this could lead to product foaming or precipitation. ?Do not use if solution is discolored or contains particulate matter or if window on prefilled pen is yellow (indicates pen has been used).  ?Reviewed storage of medication in refrigerator. Reviewed that DSpringcan be stored at room temperature in unopened carton for up to 14 days. ? ?Access: ?Approval of Dupixent through: patient assistance ? ?Medication Reconciliation ? ?A drug regimen assessment was performed, including review of allergies, interactions, disease-state management, dosing and immunization history. Medications were reviewed with the patient, including name, instructions, indication, goals of therapy, potential side effects, importance of adherence, and safe use. ? ?Drug interaction(s): none noted ? ?She will safely discard remaining Fasenra in sharps box that is at home ? ?Immunizations ? ?Patient has received 4 COVID19 vaccines. ?Patient is  UTD on pneumonia and influenza vaccine ?She is eligible for zoster vaccine ? ?Patient did not self-administer. Pharmacist administered Dupixent x 2 pens in right and left arms ?Dupixent '300mg'$ /238mautoinjectors x 2 ?[id number]: [id number] ?Exp: 05/17/2023 ? ?Patient monitored for 30 minutes and tolerated without issue. ? ?PLAN ?Continue Dupixent '300mg'$  SQ every 14 days.  Next dose is due 01/20/22 and every 14 days thereafter. Rx sent to: [facility]: [phone] maintenance asthma regimen of: Trelegy 100/62.5/25mcg (1 puff once daily), fexofenadine '180mg'$  daily, Flonase daily, methylprednisolone '2mg'$  daily, montelukast '10mg'$  nightly ? ?All questions encouraged and answered.  Instructed patient to reach out with any further questions or concerns. ? ?Thank you for allowing pharmacy to participate in this patient's care. ? ?This appointment required 60 minutes of patient care (this includes precharting, chart review, review of results, face-to-face care, etc.). ? ?DeKnox SalivaPharmD, MPH, BCPS ?Clinical Pharmacist (Rheumatology and Pulmonology) ?

## 2022-02-07 ENCOUNTER — Ambulatory Visit (INDEPENDENT_AMBULATORY_CARE_PROVIDER_SITE_OTHER): Payer: Medicare HMO | Admitting: Pulmonary Disease

## 2022-02-07 ENCOUNTER — Encounter: Payer: Self-pay | Admitting: Pulmonary Disease

## 2022-02-07 VITALS — BP 136/66 | HR 92 | Temp 98.8°F | Ht 67.0 in | Wt 195.4 lb

## 2022-02-07 DIAGNOSIS — J309 Allergic rhinitis, unspecified: Secondary | ICD-10-CM

## 2022-02-07 DIAGNOSIS — J301 Allergic rhinitis due to pollen: Secondary | ICD-10-CM | POA: Diagnosis not present

## 2022-02-07 DIAGNOSIS — J4550 Severe persistent asthma, uncomplicated: Secondary | ICD-10-CM

## 2022-02-07 DIAGNOSIS — F1920 Other psychoactive substance dependence, uncomplicated: Secondary | ICD-10-CM

## 2022-02-07 DIAGNOSIS — J449 Chronic obstructive pulmonary disease, unspecified: Secondary | ICD-10-CM

## 2022-02-07 NOTE — Patient Instructions (Addendum)
Thank you for visiting Dr. Valeta Harms at [facility]. ?Today we recommend the following: ? ?Continue current regimen  ?'2mg'$  pred daily  ?Dupixent injections  ?Trelegy  ? ?Return in about 6 months (around 08/09/2022) for with APP or Dr. Valeta Harms. ? ? ? ?Please do your part to reduce the spread of COVID-19.  ? ?

## 2022-02-07 NOTE — Progress Notes (Signed)
? ?Synopsis: Referred in Jan 2021 for asthma/SOB by Copper, Virginia [place], PA-C ? ?Subjective:  ? ?PATIENT ID: Rachael Peck GENDER: female DOB: 1945/12/21, MRN: [id number] ? ?Chief Complaint  ?Patient presents with  ? Follow-up  ?  3 month follow up. Pt is on Dupexent now, and she states its a world of difference and is doing a lot better now.   ? ? ?This is a 76 year old female past medical history of allergic rhinitis, seasonal allergies.  Has had a longstanding history of shortness of breath and respiratory complaints diagnosed with obstructive asthma.  Prior pulmonary function tests with reduced ratio and reduced FEV1 FVC.  Additionally she is overweight BMI of 31.  She does have hypertension.  She has been maintained on oral prednisone for greater than 10 years.  She has been able to quit on occasions but had much difficulty tapering off due to recurrent exacerbations.  At this point she is taking a half a tablet every other day at 2 mg every other day.  From a respiratory standpoint she does have occasional coughing fits.  Sometimes sputum production.  She does have wheezing.  Today in the office she also has a wheezing.  Prior IgE in 2017 low, prior regional allergy panel negative.  Here today to establish care with new pulmonary provider. ? ?OV 02/12/2020: overall doing well today. Still on 43m prednisone everyother day. Liked using wixela better than breztri. She would like to go back to it.  Overall respiratory symptoms are stable at this time.  Denies cough sputum production.  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  This has been going on for some time it does limit her mobility.  She does need refills today of medications as she ran out of all of them. ? ?OV 08/17/2020: saw BW on the 5th. Started on prednisone and antibiotics for a flare.  Patient doing well at this time however does not feel like she is fully recovered.  At this point was taking 2 mg of prednisone every other day.  She is  interested in going to at least once per day.  She is currently using her inhaler regimen and as prescribed.  At this time currently on WCurtice  She is not on a triple therapy regimen at the moment.  We discussed changing her inhalers.  She gets very anxious with the concept of switching her medications around as she has been stable for several years up until her recent exacerbation which does seem to have frightened her some she is improving however. ? ?02/24/2021: Here today for follow-up of her asthma.  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  She has been on oral prednisone for greater than 10 years.  When we initially met back in 2021 we work to try to come off of prednisone however she continued to have flareups.  And she has significant anxiety about coming off of prednisone so she feels like she must take it.  At 1 point time she was at least on 2 mg every other day.  At this point she is back to 4 mg a day.  She also routinely self medicates with her own regiment of prednisone.  She does however felt better after being placed on the Trelegy.  She has lots of concerns about needing additional blood pressure medications because her blood pressure is now higher than it was before.  She is concerned about being started on lisinopril due to  the side effects that she read about.  Therefore she is still only on just 2 blood pressure medications and did not start the third 1 as recommended by her cardiologist.  Today we had a long discussion about what happens when you take daily prednisone for many many years.  I think she now understands that her hypertension and diabetes and obesity are related to her being on prednisone for so many years.  I do not think that she can sustain on taking prednisone forever.  We have discussed the potential use and transition to Biologics again today to try to come off of prednisone use as well as changing her inhaler regimen she was at least open to changing  her inhaler regimen last time and does see the benefit of staying on Trelegy at this point but is concerned that her new occasional bouts of diarrhea may be related to the Trelegy. ? ?08/11/2021: Patient had recent ER visit for chest tightness shortness of breath and wheezing.  Recently started her Fasenra injections.  She did stop her prednisone.  She had a lot of chest tightness coughing sputum production.  It lasted for several days.  She felt better after the increase to prednisone.  Now she is back on 4 mg of prednisone daily.  She is only had 1 Fasenra injection.  She has her next one scheduled for tomorrow. ? ?02/07/2022: Patient doing well.  She has had approximately 4 injections of Dupixent.  She feels so much better.  Dropped her prednisone to 2 mg/day.  Still using her Trelegy daily as well as albuterol.  Overall she feels better is able to get out and do more.  She thinks over the next couple of weeks she is going to try coming off of prednisone completely.  This will be the first time in a very long time that she can go without oral corticosteroids. ? ? ?Past Medical History:  ?Diagnosis Date  ? Allergic rhinitis   ? Anxiety   ? Chest pain, atypical   ? Chronic obstructive asthma, unspecified   ? Diverticulosis of colon   ? GERD (gastroesophageal reflux disease)   ? Hypertension   ? Overweight(278.02)   ? Palpitations   ? Stress incontinence, female   ?  ? ?Family History  ?Problem Relation Age of Onset  ? Kidney failure Mother   ? Kidney failure Father   ?  ? ?Past Surgical History:  ?Procedure Laterality Date  ? ABDOMINAL HYSTERECTOMY    ? BREAST LUMPECTOMY  12/17/2012 & 01/14/2013  ? ? ?Social History  ? ?Socioeconomic History  ? Marital status: Single  ?  Spouse name: Not on file  ? Number of children: 1  ? Years of education: Not on file  ? Highest education level: Not on file  ?Occupational History  ? Not on file  ?Tobacco Use  ? Smoking status: Never  ? Smokeless tobacco: Never  ?Vaping Use  ?  Vaping Use: Never used  ?Substance and Sexual Activity  ? Alcohol use: No  ?  Alcohol/week: 0.0 standard drinks  ? Drug use: No  ? Sexual activity: Not on file  ?Other Topics Concern  ? Not on file  ?Social History Narrative  ? Not on file  ? ?Social Determinants of Health  ? ?Financial Resource Strain: Not on file  ?Food Insecurity: Not on file  ?Transportation Needs: Not on file  ?Physical Activity: Not on file  ?Stress: Not on file  ?Social Connections: Not on file  ?  Intimate Partner Violence: Not on file  ?  ? ?No Known Allergies  ? ?Outpatient Medications Prior to Visit  ?Medication Sig Dispense Refill  ? albuterol (PROVENTIL) (2.5 MG/3ML) 0.083% nebulizer solution INHALE THE CONTENTS OF 1 VIAL VIA NEBULIZER TWICE DAILY 540 mL 2  ? albuterol (VENTOLIN HFA) 108 (90 Base) MCG/ACT inhaler INHALE 1 TO 2 PUFFS EVERY 4 TO 6 HOURS AS NEEDED 6.7 g 11  ? amLODipine (NORVASC) 10 MG tablet Take 5 mg by mouth daily.    ? ARTIFICIAL TEARS ophthalmic solution As needed for dry eyes    ? aspirin 81 MG chewable tablet Chew by mouth.    ? celecoxib (CELEBREX) 200 MG capsule Take 200 mg by mouth as needed.    ? cholecalciferol (VITAMIN D) 1000 units tablet Take 1,000 Units by mouth daily.    ? cyclobenzaprine (FLEXERIL) 5 MG tablet Take 5 mg by mouth 3 (three) times daily as needed for muscle spasms (2 to 3 times as needed).    ? Dupilumab (DUPIXENT) 300 MG/2ML SOPN Inject 619m into the skin at Week 0, then 3052minto the skin every 14 days thereafter. 12 mL 1  ? famotidine (PEPCID) 20 MG tablet Take 1 tablet (20 mg total) by mouth daily. 90 tablet 3  ? fexofenadine (ALLEGRA) 180 MG tablet Take 1 tablet (180 mg total) by mouth daily. Once a day as needed for allergies 90 tablet 3  ? fluticasone (FLONASE) 50 MCG/ACT nasal spray Place 2 sprays into both nostrils daily. 48 g 3  ? Fluticasone-Umeclidin-Vilant (TRELEGY ELLIPTA) 100-62.5-25 MCG/ACT AEPB Inhale 1 puff into the lungs daily. 180 each 3  ? ipratropium (ATROVENT) 0.02 %  nebulizer solution Use twice daily 675 mL 3  ? KLOR-CON SPRINKLE 10 MEQ CR capsule TAKE 2 CAPSULES TWICE DAILY 360 capsule 2  ? lisinopril (ZESTRIL) 10 MG tablet Take 10 mg by mouth daily.    ? loratadine (CLARI

## 2022-04-01 ENCOUNTER — Other Ambulatory Visit: Payer: Self-pay | Admitting: Pulmonary Disease

## 2022-04-07 ENCOUNTER — Other Ambulatory Visit: Payer: Self-pay | Admitting: Pulmonary Disease

## 2022-06-03 ENCOUNTER — Other Ambulatory Visit: Payer: Self-pay | Admitting: Pulmonary Disease

## 2022-06-05 ENCOUNTER — Other Ambulatory Visit: Payer: Self-pay | Admitting: Pulmonary Disease

## 2022-06-06 ENCOUNTER — Other Ambulatory Visit: Payer: Self-pay | Admitting: Pharmacist

## 2022-06-06 DIAGNOSIS — J4550 Severe persistent asthma, uncomplicated: Secondary | ICD-10-CM

## 2022-06-06 MED ORDER — DUPIXENT 300 MG/2ML ~~LOC~~ SOAJ: 300.0000 mg | SUBCUTANEOUS | 0 refills | Status: DC

## 2022-06-06 NOTE — Telephone Encounter (Signed)
Refill sent for [place] to [facility]: [phone]  Dose: 300 mg SQ every 2 weeks  Last OV: 02/07/22 Provider: Dr. Valeta Harms  Next OV: 6 months (08/10/22)  Knox Saliva, PharmD, MPH, BCPS Clinical Pharmacist (Rheumatology and Pulmonology)

## 2022-08-06 ENCOUNTER — Other Ambulatory Visit: Payer: Self-pay | Admitting: Pulmonary Disease

## 2022-08-10 ENCOUNTER — Encounter: Payer: Self-pay | Admitting: Pulmonary Disease

## 2022-08-10 ENCOUNTER — Ambulatory Visit (INDEPENDENT_AMBULATORY_CARE_PROVIDER_SITE_OTHER): Payer: Medicare HMO | Admitting: Pulmonary Disease

## 2022-08-10 VITALS — BP 140/70 | HR 93 | Ht 67.0 in | Wt 192.4 lb

## 2022-08-10 DIAGNOSIS — J4489 Other specified chronic obstructive pulmonary disease: Secondary | ICD-10-CM | POA: Diagnosis not present

## 2022-08-10 DIAGNOSIS — J4550 Severe persistent asthma, uncomplicated: Secondary | ICD-10-CM | POA: Diagnosis not present

## 2022-08-10 MED ORDER — ALBUTEROL SULFATE HFA 108 (90 BASE) MCG/ACT IN AERS: 2.0000 | INHALATION_SPRAY | Freq: Four times a day (QID) | RESPIRATORY_TRACT | 11 refills | Status: DC | PRN

## 2022-08-10 MED ORDER — MONTELUKAST SODIUM 10 MG PO TABS: 10.0000 mg | ORAL_TABLET | Freq: Every day | ORAL | 3 refills | Status: DC

## 2022-08-10 MED ORDER — TRELEGY ELLIPTA 100-62.5-25 MCG/ACT IN AEPB: 1.0000 | INHALATION_SPRAY | Freq: Every day | RESPIRATORY_TRACT | 11 refills | Status: DC

## 2022-08-10 NOTE — Progress Notes (Signed)
Synopsis: Referred in Jan 2021 for asthma/SOB by Copper, [place], PA-C  Subjective:   PATIENT ID: Towana Badger GENDER: female DOB: 14-Oct-1946, MRN: [id number]  Chief Complaint  Patient presents with   Follow-up    This is a 76 year old female past medical history of allergic rhinitis, seasonal allergies.  Has had a longstanding history of shortness of breath and respiratory complaints diagnosed with obstructive asthma.  Prior pulmonary function tests with reduced ratio and reduced FEV1 FVC.  Additionally she is overweight BMI of 31.  She does have hypertension.  She has been maintained on oral prednisone for greater than 10 years.  She has been able to quit on occasions but had much difficulty tapering off due to recurrent exacerbations.  At this point she is taking a half a tablet every other day at 2 mg every other day.  From a respiratory standpoint she does have occasional coughing fits.  Sometimes sputum production.  She does have wheezing.  Today in the office she also has a wheezing.  Prior IgE in 2017 low, prior regional allergy panel negative.  Here today to establish care with new pulmonary provider.  OV 02/12/2020: overall doing well today. Still on 50m prednisone everyother day. Liked using wixela better than breztri. She would like to go back to it.  Overall respiratory symptoms are stable at this time.  Denies cough sputum production.  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  This has been going on for some time it does limit her mobility.  She does need refills today of medications as she ran out of all of them.  OV 08/17/2020: saw BW on the 5th. Started on prednisone and antibiotics for a flare.  Patient doing well at this time however does not feel like she is fully recovered.  At this point was taking 2 mg of prednisone every other day.  She is interested in going to at least once per day.  She is currently using her inhaler regimen and as prescribed.  At this time  currently on [place]  She is not on a triple therapy regimen at the moment.  We discussed changing her inhalers.  She gets very anxious with the concept of switching her medications around as she has been stable for several years up until her recent exacerbation which does seem to have frightened her some she is improving however.  02/24/2021: Here today for follow-up of her asthma.  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  She has been on oral prednisone for greater than 10 years.  When we initially met back in 2021 we work to try to come off of prednisone however she continued to have flareups.  And she has significant anxiety about coming off of prednisone so she feels like she must take it.  At 1 point time she was at least on 2 mg every other day.  At this point she is back to 4 mg a day.  She also routinely self medicates with her own regiment of prednisone.  She does however felt better after being placed on the Trelegy.  She has lots of concerns about needing additional blood pressure medications because her blood pressure is now higher than it was before.  She is concerned about being started on lisinopril due to the side effects that she read about.  Therefore she is still only on just 2 blood pressure medications and did not start the third 1 as recommended  by her cardiologist.  Today we had a long discussion about what happens when you take daily prednisone for many many years.  I think she now understands that her hypertension and diabetes and obesity are related to her being on prednisone for so many years.  I do not think that she can sustain on taking prednisone forever.  We have discussed the potential use and transition to [employer] again today to try to come off of prednisone use as well as changing her inhaler regimen she was at least open to changing her inhaler regimen last time and does see the benefit of staying on Trelegy at this point but is concerned that her new  occasional bouts of diarrhea may be related to the Trelegy.  08/11/2021: Patient had recent ER visit for chest tightness shortness of breath and wheezing.  Recently started her Fasenra injections.  She did stop her prednisone.  She had a lot of chest tightness coughing sputum production.  It lasted for several days.  She felt better after the increase to prednisone.  Now she is back on 4 mg of prednisone daily.  She is only had 1 Fasenra injection.  She has her next one scheduled for tomorrow.  02/07/2022: Patient doing well.  She has had approximately 4 injections of Dupixent.  She feels so much better.  Dropped her prednisone to 2 mg/day.  Still using her Trelegy daily as well as albuterol.  Overall she feels better is able to get out and do more.  She thinks over the next couple of weeks she is going to try coming off of prednisone completely.  This will be the first time in a very long time that she can go without oral corticosteroids.  OV 08/10/2022: Patient here today for follow-up regarding her severe asthma.  She is doing so much better with injections of Dupixent.  She is completely off of prednisone using her Trelegy daily.  She has had no exacerbations.  She feels much better has much more energy able to do the things that she wants to do around her home.  Able to complete all of the activities of daily living.  She is much more active now in her church.  Still playing the piano and organ.      Past Medical History:  Diagnosis Date   Allergic rhinitis    Anxiety    Chest pain, atypical    Chronic obstructive asthma, unspecified    Diverticulosis of colon    GERD (gastroesophageal reflux disease)    Hypertension    Overweight(278.02)    Palpitations    Stress incontinence, female      Family History  Problem Relation Age of Onset   Kidney failure Mother    Kidney failure Father      Past Surgical History:  Procedure Laterality Date   ABDOMINAL HYSTERECTOMY     BREAST  LUMPECTOMY  12/17/2012 & 01/14/2013    Social History   Socioeconomic History   Marital status: Single    Spouse name: Not on file   Number of children: 1   Years of education: Not on file   Highest education level: Not on file  Occupational History   Not on file  Tobacco Use   Smoking status: Never   Smokeless tobacco: Never  Vaping Use   Vaping Use: Never used  Substance and Sexual Activity   Alcohol use: No    Alcohol/week: 0.0 standard drinks of alcohol   Drug use: No  Sexual activity: Not on file  Other Topics Concern   Not on file  Social History Narrative   Not on file   Social Determinants of Health   Financial Resource Strain: Not on file  Food Insecurity: Not on file  Transportation Needs: Not on file  Physical Activity: Not on file  Stress: Not on file  Social Connections: Not on file  Intimate Partner Violence: Not on file     No Known Allergies   Outpatient Medications Prior to Visit  Medication Sig Dispense Refill   albuterol (PROVENTIL) (2.5 MG/3ML) 0.083% nebulizer solution INHALE THE CONTENTS OF 1 VIAL VIA NEBULIZER TWICE DAILY 540 mL 2   albuterol (VENTOLIN HFA) 108 (90 Base) MCG/ACT inhaler INHALE 1 TO 2 PUFFS EVERY 4 TO 6 HOURS AS NEEDED 1 each 10   Dupilumab (DUPIXENT) 300 MG/2ML SOPN Inject 300 mg into the skin every 14 (fourteen) days. 12 mL 0   fexofenadine (ALLEGRA) 180 MG tablet Take 1 tablet (180 mg total) by mouth daily. Once a day as needed for allergies 90 tablet 3   fluticasone (FLONASE) 50 MCG/ACT nasal spray Place 2 sprays into both nostrils daily. 48 g 3   ipratropium (ATROVENT) 0.02 % nebulizer solution Use twice daily 675 mL 3   montelukast (SINGULAIR) 10 MG tablet Take 1 tablet (10 mg total) by mouth at bedtime. 90 tablet 3   amLODipine (NORVASC) 10 MG tablet Take 5 mg by mouth daily.     ARTIFICIAL TEARS ophthalmic solution As needed for dry eyes     aspirin 81 MG chewable tablet Chew by mouth.     celecoxib (CELEBREX) 200 MG  capsule Take 200 mg by mouth as needed.     cholecalciferol (VITAMIN D) 1000 units tablet Take 1,000 Units by mouth daily.     cyclobenzaprine (FLEXERIL) 5 MG tablet Take 5 mg by mouth 3 (three) times daily as needed for muscle spasms (2 to 3 times as needed).     famotidine (PEPCID) 20 MG tablet Take 1 tablet (20 mg total) by mouth daily. 90 tablet 3   KLOR-CON SPRINKLE 10 MEQ CR capsule TAKE 2 CAPSULES TWICE DAILY (Patient not taking: Reported on 08/10/2022) 360 capsule 2   lisinopril (ZESTRIL) 10 MG tablet Take 10 mg by mouth daily. (Patient not taking: Reported on 08/10/2022)     loratadine (CLARITIN) 10 MG tablet Take 10 mg by mouth daily as needed.     methylPREDNISolone (MEDROL) 4 MG tablet Take 1/2 to 1 tablet every other day. (Patient taking differently: Take 2 mg by mouth daily. Take 1/2 to 1 tablet every other day.) 45 tablet 3   metoprolol (LOPRESSOR) 100 MG tablet Take 100 mg by mouth daily.     pantoprazole (PROTONIX) 40 MG tablet TAKE 1 TABLET DAILY  30 MINUTES PRIOR TO BREAKFAST 90 tablet 0   TRELEGY ELLIPTA 100-62.5-25 MCG/ACT AEPB INHALE 1 PUFF EVERY DAY 180 each 3   vitamin B-12 (CYANOCOBALAMIN) 1000 MCG tablet Take 1,000 mcg by mouth daily.     No facility-administered medications prior to visit.    Review of Systems  Constitutional:  Negative for chills, fever, malaise/fatigue and weight loss.  HENT:  Negative for hearing loss, sore throat and tinnitus.   Eyes:  Negative for blurred vision and double vision.  Respiratory:  Negative for cough, hemoptysis, sputum production, shortness of breath, wheezing and stridor.   Cardiovascular:  Negative for chest pain, palpitations, orthopnea, leg swelling and PND.  Gastrointestinal:  Negative for  abdominal pain, constipation, diarrhea, heartburn, nausea and vomiting.  Genitourinary:  Negative for dysuria, hematuria and urgency.  Musculoskeletal:  Negative for joint pain and myalgias.  Skin:  Negative for itching and rash.   Neurological:  Negative for dizziness, tingling, weakness and headaches.  Endo/Heme/Allergies:  Negative for environmental allergies. Does not bruise/bleed easily.  Psychiatric/Behavioral:  Negative for depression. The patient is not nervous/anxious and does not have insomnia.   All other systems reviewed and are negative.   Objective:  Physical Exam Vitals reviewed.  Constitutional:      General: She is not in acute distress.    Appearance: She is well-developed.  HENT:     Head: Normocephalic and atraumatic.  Eyes:     General: No scleral icterus.    Conjunctiva/sclera: Conjunctivae normal.     Pupils: Pupils are equal, round, and reactive to light.  Neck:     Vascular: No JVD.     Trachea: No tracheal deviation.  Cardiovascular:     Rate and Rhythm: Normal rate and regular rhythm.     Heart sounds: Normal heart sounds. No murmur heard. Pulmonary:     Effort: Pulmonary effort is normal. No tachypnea, accessory muscle usage or respiratory distress.     Breath sounds: No stridor. No wheezing, rhonchi or rales.     Comments: No wheezing Abdominal:     General: There is no distension.     Palpations: Abdomen is soft.     Tenderness: There is no abdominal tenderness.  Musculoskeletal:        General: No tenderness.     Cervical back: Neck supple.  Lymphadenopathy:     Cervical: No cervical adenopathy.  Skin:    General: Skin is warm and dry.     Capillary Refill: Capillary refill takes less than 2 seconds.     Findings: No rash.  Neurological:     Mental Status: She is alert and oriented to person, place, and time.  Psychiatric:        Behavior: Behavior normal.      Vitals:   08/10/22 1343  BP: (!) 140/70  Pulse: 93  SpO2: 97%  Weight: 192 lb 6.4 oz (87.3 kg)  Height: _0  (1.702 m)   97% on RA BMI Readings from Last 3 Encounters:  08/10/22 30.13 kg/m  02/07/22 30.60 kg/m  11/12/21 30.99 kg/m   Wt Readings from Last 3 Encounters:  08/10/22 192 lb  6.4 oz (87.3 kg)  02/07/22 195 lb 6.4 oz (88.6 kg)  11/12/21 192 lb (87.1 kg)   December 13, 2018: Care everywhere [facility] results reviewed Influenza a H1 N1 detected positive, reviewed 11/13/2019 office visit   Chest Imaging: Chest x-ray 11/09/2017 hyperinflated lung fields no infiltrate. The patient's images have been independently reviewed by me.    Pulmonary Functions Testing Results:     No data to display          FeNO: None   Pathology: None   Echocardiogram: 12/24/2018 echo [facility] care everywhere result, LVEF 65 to 70%  Heart Catheterization: None     Assessment & Plan:     ICD-10-CM   1. Severe persistent asthma without complication  A63.01     2. Chronic obstructive airway disease with asthma  J44.89       Assessment:   This is a 76 year old female, BMI 30, chronic obstructive pulmonary disease, asthma COPD overlap syndrome, longstanding history of seasonal allergies and steroid dependence.  She been on and off  steroids for the past 10+ years.  When we first met she was still on prednisone and we had dropped her down to 2 mg a day.  We started her on Dupixent injections and she is doing wonderfully.  Plan: Continue Trelegy inhaler Continue Dupixent injections Continue albuterol as needed Return to clinic in 6 months or as needed.   Garner Nash, DO [facility] 08/10/2022 2:05 PM

## 2022-08-10 NOTE — Patient Instructions (Signed)
Thank you for visiting Dr. Valeta Harms at [facility]. Today we recommend the following:  Continue current regimen of trelegy and dupixent  Albuterol as needed   Return in about 6 months (around 02/09/2023).    Please do your part to reduce the spread of COVID-19.

## 2022-08-18 ENCOUNTER — Other Ambulatory Visit: Payer: Self-pay | Admitting: Pulmonary Disease

## 2022-08-18 NOTE — Telephone Encounter (Signed)
No using pred at this time

## 2022-08-22 ENCOUNTER — Other Ambulatory Visit: Payer: Self-pay | Admitting: Pharmacist

## 2022-08-22 DIAGNOSIS — J4550 Severe persistent asthma, uncomplicated: Secondary | ICD-10-CM

## 2022-08-22 MED ORDER — DUPIXENT 300 MG/2ML ~~LOC~~ SOAJ: 300.0000 mg | SUBCUTANEOUS | 1 refills | Status: DC

## 2022-08-22 NOTE — Telephone Encounter (Signed)
Refill sent for Rachael Peck to [facility]: [phone]  Dose: 300 mg SQ every 2 weeks  Last OV: 08/10/22 Provider: Dr. Valeta Harms  Next OV: not yet scheduled  Knox Saliva, PharmD, MPH, BCPS Clinical Pharmacist (Rheumatology and Pulmonology)

## 2022-08-23 ENCOUNTER — Telehealth: Payer: Self-pay | Admitting: Pulmonary Disease

## 2022-08-23 NOTE — Telephone Encounter (Signed)
Called and spoke to patient and she states that she is needing refills for her medrol '4mg'$ .   She states she is unsure if she is to continue on it due to her being on [place].   Please advise sir if you want her to continue this medication

## 2022-08-25 ENCOUNTER — Other Ambulatory Visit: Payer: Self-pay | Admitting: *Deleted

## 2022-08-25 DIAGNOSIS — J4489 Other specified chronic obstructive pulmonary disease: Secondary | ICD-10-CM

## 2022-08-25 DIAGNOSIS — J4550 Severe persistent asthma, uncomplicated: Secondary | ICD-10-CM

## 2022-08-25 MED ORDER — ALBUTEROL SULFATE HFA 108 (90 BASE) MCG/ACT IN AERS: 2.0000 | INHALATION_SPRAY | Freq: Four times a day (QID) | RESPIRATORY_TRACT | 3 refills | Status: DC | PRN

## 2022-09-05 MED ORDER — METHYLPREDNISOLONE 4 MG PO TABS: ORAL_TABLET | ORAL | 3 refills | Status: DC

## 2022-09-05 NOTE — Addendum Note (Signed)
Addended by: Monna Fam L on: 09/05/2022 10:20 AM   Modules accepted: Orders

## 2022-09-05 NOTE — Telephone Encounter (Signed)
Called patient and advised her that Dr Valeta Harms wants her to continue the Medrol even with her being on Dupient. Pt verified pharmacy with me over the phone. Nothing further needed

## 2022-09-05 NOTE — Telephone Encounter (Signed)
Called and left voicemail for patient that I made a mistake on the previous voicemail and that I was not supposed to send in the prednisone since she is on Dupixent. I apologized to her for my mistake and canceled the prednisone order. I told her to call office with any questions or concerns. Nothing further needed

## 2022-09-07 ENCOUNTER — Telehealth: Payer: Self-pay | Admitting: Pulmonary Disease

## 2022-09-07 DIAGNOSIS — J4550 Severe persistent asthma, uncomplicated: Secondary | ICD-10-CM

## 2022-09-07 MED ORDER — DUPIXENT 300 MG/2ML ~~LOC~~ SOAJ: 300.0000 mg | SUBCUTANEOUS | 1 refills | Status: DC

## 2022-09-07 NOTE — Telephone Encounter (Signed)
Called patient this morning and she states that she was notified that she was approved for [place]. She states that she is needing an prescription for her Dupixent sent into her speciality pharmacy.   Please advise

## 2022-09-07 NOTE — Telephone Encounter (Signed)
Patient called to inform the nurse and doctor that she was approved for the [place].  She stated that the [place] told her to request a new script for dupixent with a 90-day supple and 3 refills.  Please advise and call patient if there are any questions.  CB# [phone]

## 2022-09-07 NOTE — Telephone Encounter (Signed)
Refill sent for [place] to [place]: [phone]  Dose: 300 mg SQ every 2 weeks  Last OV: 08/10/22 Provider: Dr. Valeta Harms  Next OV: 6 months (April 2024)  Knox Saliva, PharmD, MPH, BCPS Clinical Pharmacist (Rheumatology and Pulmonology)

## 2022-09-16 ENCOUNTER — Telehealth: Payer: Self-pay | Admitting: Pulmonary Disease

## 2022-09-16 NOTE — Telephone Encounter (Signed)
Called and spoke to patient and she states she is wondering if it was safe for her to get the covid vaccine given that she just started [place] in March of this year.  I advised her that I would ask Dr Valeta Harms his advise and get back with her. She states if she does not answer to leave a detailed message of the information.  Dr Valeta Harms please advise

## 2022-09-19 ENCOUNTER — Telehealth: Payer: Self-pay

## 2022-09-19 NOTE — Telephone Encounter (Signed)
Received a fax from  [place] regarding an approval for [place] patient assistance from 10/17/2022 to 10/16/2022. Approval letter sent to scan center.

## 2022-09-19 NOTE — Telephone Encounter (Signed)
Called and spoke to patient and went over the recommendations from Dr Valeta Harms. She states she will let us know when she gets her covid vaccine. Nothing further needed

## 2022-09-22 ENCOUNTER — Other Ambulatory Visit: Payer: Self-pay | Admitting: *Deleted

## 2022-09-22 MED ORDER — FAMOTIDINE 20 MG PO TABS: 20.0000 mg | ORAL_TABLET | Freq: Every day | ORAL | 3 refills | Status: DC

## 2022-09-24 ENCOUNTER — Other Ambulatory Visit: Payer: Self-pay | Admitting: Pulmonary Disease

## 2022-10-14 ENCOUNTER — Telehealth: Payer: Self-pay | Admitting: Pulmonary Disease

## 2022-10-14 MED ORDER — PREDNISONE 10 MG PO TABS: ORAL_TABLET | ORAL | 0 refills | Status: AC

## 2022-10-14 NOTE — Telephone Encounter (Signed)
Spoke with pt and reviewed Dr. Juline Patch advice along with medication education. Pt confirmed pharmacy and understanding. Orders placed. Nothing further needed at this time.

## 2022-10-14 NOTE — Telephone Encounter (Signed)
Spoke with Rachael Peck who states that she has had a runny nose and been sneezing for 1 week. Today her throat began to hurst and she has felt warm. Rachael Peck states she did take a Sudafed last week which helped but it wasn't helping today. Rachael Peck was scared to take any other OTC meds because she is currently taking Dupixent and Trelegy which are working really well for her. Dr. Valeta Harms could you please advise on what Rachael Peck can take to help?

## 2023-01-20 ENCOUNTER — Ambulatory Visit (INDEPENDENT_AMBULATORY_CARE_PROVIDER_SITE_OTHER): Payer: Medicare HMO | Admitting: Pulmonary Disease

## 2023-01-20 ENCOUNTER — Encounter: Payer: Self-pay | Admitting: Pulmonary Disease

## 2023-01-20 VITALS — BP 140/70 | HR 93 | Ht 67.0 in | Wt 197.6 lb

## 2023-01-20 DIAGNOSIS — J4550 Severe persistent asthma, uncomplicated: Secondary | ICD-10-CM | POA: Diagnosis not present

## 2023-01-20 DIAGNOSIS — J301 Allergic rhinitis due to pollen: Secondary | ICD-10-CM | POA: Diagnosis not present

## 2023-01-20 DIAGNOSIS — J4489 Other specified chronic obstructive pulmonary disease: Secondary | ICD-10-CM

## 2023-01-20 DIAGNOSIS — J309 Allergic rhinitis, unspecified: Secondary | ICD-10-CM | POA: Diagnosis not present

## 2023-01-20 NOTE — Patient Instructions (Addendum)
Thank you for visiting Dr. Tonia Brooms at [facility]. Today we recommend the following:  Keep you dupixent going and trelegy   Return in about 1 year (around 01/20/2024) for with APP or Dr. Tonia Brooms.    Please do your part to reduce the spread of COVID-19.

## 2023-01-20 NOTE — Progress Notes (Signed)
Synopsis: Referred in Jan 2021 for asthma/SOB by Inez Catalina, PA-C  Subjective:   PATIENT ID: Rachael Peck GENDER: female DOB: 25-Sep-1946, MRN: [id number]  Chief Complaint  Patient presents with   Follow-up    F/up    This is a 77 year old female past medical history of allergic rhinitis, seasonal allergies.  Has had a longstanding history of shortness of breath and respiratory complaints diagnosed with obstructive asthma.  Prior pulmonary function tests with reduced ratio and reduced FEV1 FVC.  Additionally she is overweight BMI of 31.  She does have hypertension.  She has been maintained on oral prednisone for greater than 10 years.  She has been able to quit on occasions but had much difficulty tapering off due to recurrent exacerbations.  At this point she is taking a half a tablet every other day at 2 mg every other day.  From a respiratory standpoint she does have occasional coughing fits.  Sometimes sputum production.  She does have wheezing.  Today in the office she also has a wheezing.  Prior IgE in 2017 low, prior regional allergy panel negative.  Here today to establish care with new pulmonary provider.  OV 02/12/2020: overall doing well today. Still on 5mg  prednisone everyother day. Liked using wixela better than breztri. She would like to go back to it.  Overall respiratory symptoms are stable at this time.  Denies cough sputum production.  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  This has been going on for some time it does limit her mobility.  She does need refills today of medications as she ran out of all of them.  OV 08/17/2020: saw BW on the 5th. Started on prednisone and antibiotics for a flare.  Patient doing well at this time however does not feel like she is fully recovered.  At this point was taking 2 mg of prednisone every other day.  She is interested in going to at least once per day.  She is currently using her inhaler regimen and as prescribed.  At this  time currently on Wixela.  She is not on a triple therapy regimen at the moment.  We discussed changing her inhalers.  She gets very anxious with the concept of switching her medications around as she has been stable for several years up until her recent exacerbation which does seem to have frightened her some she is improving however.  02/24/2021: Here today for follow-up of her asthma.  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  She has been on oral prednisone for greater than 10 years.  When we initially met back in 2021 we work to try to come off of prednisone however she continued to have flareups.  And she has significant anxiety about coming off of prednisone so she feels like she must take it.  At 1 point time she was at least on 2 mg every other day.  At this point she is back to 4 mg a day.  She also routinely self medicates with her own regiment of prednisone.  She does however felt better after being placed on the Trelegy.  She has lots of concerns about needing additional blood pressure medications because her blood pressure is now higher than it was before.  She is concerned about being started on lisinopril due to the side effects that she read about.  Therefore she is still only on just 2 blood pressure medications and did not start the  third 1 as recommended by her cardiologist.  Today we had a long discussion about what happens when you take daily prednisone for many many years.  I think she now understands that her hypertension and diabetes and obesity are related to her being on prednisone for so many years.  I do not think that she can sustain on taking prednisone forever.  We have discussed the potential use and transition to [employer] again today to try to come off of prednisone use as well as changing her inhaler regimen she was at least open to changing her inhaler regimen last time and does see the benefit of staying on Trelegy at this point but is concerned that her  new occasional bouts of diarrhea may be related to the Trelegy.  08/11/2021: Patient had recent ER visit for chest tightness shortness of breath and wheezing.  Recently started her Fasenra injections.  She did stop her prednisone.  She had a lot of chest tightness coughing sputum production.  It lasted for several days.  She felt better after the increase to prednisone.  Now she is back on 4 mg of prednisone daily.  She is only had 1 Fasenra injection.  She has her next one scheduled for tomorrow.  02/07/2022: Patient doing well.  She has had approximately 4 injections of Dupixent.  She feels so much better.  Dropped her prednisone to 2 mg/day.  Still using her Trelegy daily as well as albuterol.  Overall she feels better is able to get out and do more.  She thinks over the next couple of weeks she is going to try coming off of prednisone completely.  This will be the first time in a very long time that she can go without oral corticosteroids.  OV 08/10/2022: Patient here today for follow-up regarding her severe asthma.  She is doing so much better with injections of Dupixent.  She is completely off of prednisone using her Trelegy daily.  She has had no exacerbations.  She feels much better has much more energy able to do the things that she wants to do around her home.  Able to complete all of the activities of daily living.  She is much more active now in her church.  Still playing the piano and organ.  OV 01/20/2023: Here today for follow-up for severe asthma.  Doing very well on her Dupixent injections.  She did have 1 exacerbation requiring steroids this past year.  Otherwise breathing well using her Trelegy daily.  Still playing the piano.      Past Medical History:  Diagnosis Date   Allergic rhinitis    Anxiety    Chest pain, atypical    Chronic obstructive asthma, unspecified    Diverticulosis of colon    GERD (gastroesophageal reflux disease)    Hypertension    Overweight(278.02)     Palpitations    Stress incontinence, female      Family History  Problem Relation Age of Onset   Kidney failure Mother    Kidney failure Father      Past Surgical History:  Procedure Laterality Date   ABDOMINAL HYSTERECTOMY     BREAST LUMPECTOMY  12/17/2012 & 01/14/2013    Social History   Socioeconomic History   Marital status: Single    Spouse name: Not on file   Number of children: 1   Years of education: Not on file   Highest education level: Not on file  Occupational History   Not on file  Tobacco Use   Smoking status: Never   Smokeless tobacco: Never  Vaping Use   Vaping Use: Never used  Substance and Sexual Activity   Alcohol use: No    Alcohol/week: 0.0 standard drinks of alcohol   Drug use: No   Sexual activity: Not on file  Other Topics Concern   Not on file  Social History Narrative   Not on file   Social Determinants of Health   Financial Resource Strain: Not on file  Food Insecurity: Not on file  Transportation Needs: Not on file  Physical Activity: Not on file  Stress: Not on file  Social Connections: Not on file  Intimate Partner Violence: Not on file     No Known Allergies   Outpatient Medications Prior to Visit  Medication Sig Dispense Refill   albuterol (PROVENTIL) (2.5 MG/3ML) 0.083% nebulizer solution INHALE THE CONTENTS OF 1 VIAL VIA NEBULIZER TWICE DAILY 540 mL 2   albuterol (VENTOLIN HFA) 108 (90 Base) MCG/ACT inhaler Inhale 2 puffs into the lungs every 6 (six) hours as needed for wheezing or shortness of breath. 24 g 3   amLODipine (NORVASC) 10 MG tablet Take 5 mg by mouth daily.     ARTIFICIAL TEARS ophthalmic solution As needed for dry eyes     aspirin 81 MG chewable tablet Chew by mouth.     celecoxib (CELEBREX) 200 MG capsule Take 200 mg by mouth as needed.     cholecalciferol (VITAMIN D) 1000 units tablet Take 1,000 Units by mouth daily.     cyclobenzaprine (FLEXERIL) 5 MG tablet Take 5 mg by mouth 3 (three) times daily as  needed for muscle spasms (2 to 3 times as needed).     Dupilumab (DUPIXENT) 300 MG/2ML SOPN Inject 300 mg into the skin every 14 (fourteen) days. 12 mL 1   famotidine (PEPCID) 20 MG tablet Take 1 tablet (20 mg total) by mouth daily. 90 tablet 3   fexofenadine (ALLEGRA) 180 MG tablet Take 1 tablet (180 mg total) by mouth daily. Once a day as needed for allergies 90 tablet 3   fluticasone (FLONASE) 50 MCG/ACT nasal spray USE 2 SPRAYS IN EACH NOSTRIL EVERY DAY 48 g 3   Fluticasone-Umeclidin-Vilant (TRELEGY ELLIPTA) 100-62.5-25 MCG/ACT AEPB Inhale 1 each into the lungs daily. 1 each 11   ipratropium (ATROVENT) 0.02 % nebulizer solution Use twice daily 675 mL 3   KLOR-CON SPRINKLE 10 MEQ CR capsule TAKE 2 CAPSULES TWICE DAILY 360 capsule 2   loratadine (CLARITIN) 10 MG tablet Take 10 mg by mouth daily as needed.     losartan (COZAAR) 100 MG tablet Take 100 mg by mouth daily.     metoprolol (LOPRESSOR) 100 MG tablet Take 100 mg by mouth daily.     montelukast (SINGULAIR) 10 MG tablet Take 1 tablet (10 mg total) by mouth at bedtime. 90 tablet 3   pantoprazole (PROTONIX) 40 MG tablet TAKE 1 TABLET DAILY  30 MINUTES PRIOR TO BREAKFAST 90 tablet 0   vitamin B-12 (CYANOCOBALAMIN) 1000 MCG tablet Take 1,000 mcg by mouth daily.     TRELEGY ELLIPTA 100-62.5-25 MCG/ACT AEPB INHALE 1 PUFF EVERY DAY (Patient not taking: Reported on 01/20/2023) 180 each 3   lisinopril (ZESTRIL) 10 MG tablet Take 10 mg by mouth daily. (Patient not taking: Reported on 08/10/2022)     No facility-administered medications prior to visit.    Review of Systems  Constitutional:  Negative for chills, fever, malaise/fatigue and weight loss.  HENT:  Negative  for hearing loss, sore throat and tinnitus.   Eyes:  Negative for blurred vision and double vision.  Respiratory:  Negative for cough, hemoptysis, sputum production, shortness of breath, wheezing and stridor.   Cardiovascular:  Negative for chest pain, palpitations, orthopnea, leg  swelling and PND.  Gastrointestinal:  Negative for abdominal pain, constipation, diarrhea, heartburn, nausea and vomiting.  Genitourinary:  Negative for dysuria, hematuria and urgency.  Musculoskeletal:  Negative for joint pain and myalgias.  Skin:  Negative for itching and rash.  Neurological:  Negative for dizziness, tingling, weakness and headaches.  Endo/Heme/Allergies:  Negative for environmental allergies. Does not bruise/bleed easily.  Psychiatric/Behavioral:  Negative for depression. The patient is not nervous/anxious and does not have insomnia.   All other systems reviewed and are negative.   Objective:  Physical Exam Vitals reviewed.  Constitutional:      General: She is not in acute distress.    Appearance: She is well-developed.  HENT:     Head: Normocephalic and atraumatic.  Eyes:     General: No scleral icterus.    Conjunctiva/sclera: Conjunctivae normal.     Pupils: Pupils are equal, round, and reactive to light.  Neck:     Vascular: No JVD.     Trachea: No tracheal deviation.  Cardiovascular:     Rate and Rhythm: Normal rate and regular rhythm.     Heart sounds: Normal heart sounds. No murmur heard. Pulmonary:     Effort: Pulmonary effort is normal. No tachypnea, accessory muscle usage or respiratory distress.     Breath sounds: No stridor. No wheezing, rhonchi or rales.     Comments: No wheezing Abdominal:     General: There is no distension.     Palpations: Abdomen is soft.     Tenderness: There is no abdominal tenderness.  Musculoskeletal:        General: No tenderness.     Cervical back: Neck supple.  Lymphadenopathy:     Cervical: No cervical adenopathy.  Skin:    General: Skin is warm and dry.     Capillary Refill: Capillary refill takes less than 2 seconds.     Findings: No rash.  Neurological:     Mental Status: She is alert and oriented to person, place, and time.  Psychiatric:        Behavior: Behavior normal.      Vitals:   01/20/23  1415  BP: (!) 140/70  Pulse: 93  SpO2: 97%  Weight: 197 lb 9.6 oz (89.6 kg)  Height: 5\' 7"  (1.702 m)   97% on RA BMI Readings from Last 3 Encounters:  01/20/23 30.95 kg/m  08/10/22 30.13 kg/m  02/07/22 30.60 kg/m   Wt Readings from Last 3 Encounters:  01/20/23 197 lb 9.6 oz (89.6 kg)  08/10/22 192 lb 6.4 oz (87.3 kg)  02/07/22 195 lb 6.4 oz (88.6 kg)   December 13, 2018: Care everywhere [facility] results reviewed Influenza a H1 N1 detected positive, reviewed 11/13/2019 office visit   Chest Imaging: Chest x-ray 11/09/2017 hyperinflated lung fields no infiltrate. The patient's images have been independently reviewed by me.    Pulmonary Functions Testing Results:     No data to display          FeNO: None   Pathology: None   Echocardiogram: 12/24/2018 echo [facility] care everywhere result, LVEF 65 to 70%  Heart Catheterization: None     Assessment & Plan:     ICD-10-CM   1. Severe persistent asthma without  complication  J45.50     2. Chronic obstructive airway disease with asthma  J44.89     3. Non-seasonal allergic rhinitis due to pollen  J30.1     4. Allergic rhinitis, unspecified seasonality, unspecified trigger  J30.9       Assessment:   This is a 77 year old female, BMI of 30, chronic obstructive pulmonary disease and asthma, asthma COPD overlap syndrome and ongoing history with issues of seasonal allergies and steroid dependence.  She been on and off steroids for 10+ years.  At 1 point in time we were slowly tapering down as low as a milligram per month.  At this point she has been doing wonderful on Dupixent and completely off of steroids further almost a year now.  She is breathing better than she is ever breathe before and she is only had 1 exacerbation this year  Plan: Continue Dupixent injections Continue Trelegy inhaler daily Continue albuterol as needed Return to clinic in 1 year or as needed.   Josephine Igo, DO [facility] 01/20/2023 3:01 PM

## 2023-03-16 ENCOUNTER — Other Ambulatory Visit: Payer: Self-pay | Admitting: Pulmonary Disease

## 2023-03-27 ENCOUNTER — Telehealth: Payer: Self-pay | Admitting: Pulmonary Disease

## 2023-03-27 DIAGNOSIS — J4550 Severe persistent asthma, uncomplicated: Secondary | ICD-10-CM

## 2023-03-27 NOTE — Telephone Encounter (Signed)
Pt was short shipped for her dupixent, she only received one box with only two injections.

## 2023-03-28 NOTE — Telephone Encounter (Signed)
Please advise 

## 2023-03-29 MED ORDER — DUPIXENT 300 MG/2ML ~~LOC~~ SOAJ
300.0000 mg | SUBCUTANEOUS | 3 refills | Status: DC
Start: 2023-03-29 — End: 2024-03-14

## 2023-03-29 NOTE — Telephone Encounter (Signed)
Pt calling back about her dupixent shots

## 2023-03-29 NOTE — Telephone Encounter (Signed)
Reached back out to pt and discussed, from what she tells me it sounds like she simply needs to have a refill sent in to [facility] (this is further corroborated by her med list which shows a 16-month refill was last sent on 09/07/22). Advised that we would send in a new Rx and that she should be able to contact [facility] tomorrow and get her shipment scheduled.  She expressed concerns about the potential for delays and her worry of being "without" the medication in her system and I reassured her that if she comes due for her next injection and she no longer has any on-hand then she can reach out to [place] at any time and we will set aside a sample for her. She verbalized understanding and appreciation to all.

## 2023-03-29 NOTE — Telephone Encounter (Signed)
Refill sent for DUPIXENT to [facility]: [phone]  Dose: 300 mg SQ every 2 weeks  Last OV: 01/20/23 Provider: Dr. Tonia Brooms  Next OV: not scheduled but due in one year  Rachael Peck, PharmD, MPH, BCPS Clinical Pharmacist (Rheumatology and Pulmonology)

## 2023-04-21 ENCOUNTER — Other Ambulatory Visit: Payer: Self-pay | Admitting: Pulmonary Disease

## 2023-04-21 DIAGNOSIS — J4489 Other specified chronic obstructive pulmonary disease: Secondary | ICD-10-CM

## 2023-04-21 DIAGNOSIS — J4550 Severe persistent asthma, uncomplicated: Secondary | ICD-10-CM

## 2023-05-28 ENCOUNTER — Other Ambulatory Visit: Payer: Self-pay | Admitting: Pulmonary Disease

## 2023-06-22 ENCOUNTER — Other Ambulatory Visit: Payer: Self-pay | Admitting: Pulmonary Disease

## 2023-06-22 DIAGNOSIS — J4550 Severe persistent asthma, uncomplicated: Secondary | ICD-10-CM

## 2023-06-22 DIAGNOSIS — J4489 Other specified chronic obstructive pulmonary disease: Secondary | ICD-10-CM

## 2023-07-08 ENCOUNTER — Other Ambulatory Visit: Payer: Self-pay | Admitting: Pulmonary Disease

## 2023-08-09 ENCOUNTER — Other Ambulatory Visit: Payer: Self-pay | Admitting: Pulmonary Disease

## 2023-09-18 ENCOUNTER — Telehealth: Payer: Self-pay | Admitting: Pulmonary Disease

## 2023-09-18 NOTE — Telephone Encounter (Signed)
Patient would like to speak with someone about her Dupixent.  She is in a program and has to have information completed by the end of the year.

## 2023-09-20 NOTE — Telephone Encounter (Signed)
Returned call to patient. She states that she spoke with [employer] and was told she was approved. She received a text message on 09/18/23 but does not state that she is approved through 10/16/2024  Called [employer] to confirm enrollment. Rep confirms patient is enrolled through end of 2025 as of 09/18/2023  Chesley Mires, PharmD, MPH, BCPS, CPP Clinical Pharmacist (Rheumatology and Pulmonology)

## 2023-10-01 ENCOUNTER — Other Ambulatory Visit: Payer: Self-pay | Admitting: Pulmonary Disease

## 2023-11-24 ENCOUNTER — Other Ambulatory Visit: Payer: Self-pay | Admitting: Pulmonary Disease

## 2023-12-27 ENCOUNTER — Ambulatory Visit: Payer: Medicare HMO | Admitting: Primary Care

## 2023-12-27 ENCOUNTER — Encounter: Payer: Self-pay | Admitting: Primary Care

## 2023-12-27 ENCOUNTER — Ambulatory Visit: Payer: Medicare HMO | Admitting: Pulmonary Disease

## 2023-12-27 VITALS — BP 160/70 | HR 80 | Temp 97.3°F | Ht 67.0 in | Wt 188.6 lb

## 2023-12-27 DIAGNOSIS — J4550 Severe persistent asthma, uncomplicated: Secondary | ICD-10-CM | POA: Diagnosis not present

## 2023-12-27 MED ORDER — TRELEGY ELLIPTA 100-62.5-25 MCG/ACT IN AEPB: 1.0000 | INHALATION_SPRAY | Freq: Every day | RESPIRATORY_TRACT | 3 refills | Status: DC

## 2023-12-27 NOTE — Progress Notes (Signed)
 @Patient  ID: Rachael Peck, female    DOB: 08-Apr-1946, 78 y.o.   MRN: [id number]  Chief Complaint  Patient presents with   Follow-up    Referring provider: Olevia Perches  HPI: 78 year old female, never smoked. Followed in our office for hx severe persistent asthma, LPR, chronic obstructive airway disease, allergic rhinitis. Patient of Dr. Tonia Brooms.  Prior pulmonary function tests with reduced ratio and reduced FEV1 FVC.  Additionally she is overweight BMI of 31.  She does have hypertension.  She has been maintained on oral prednisone for greater than 10 years.  She has been able to quit on occasions but had much difficulty tapering off due to recurrent exacerbations  Previous [facility] PULMONARY ENCOUNTER:  OV 02/12/2020: overall doing well today. Still on 5mg  prednisone everyother day. Liked using wixela better than breztri. She would like to go back to it.  Overall respiratory symptoms are stable at this time.  Denies cough sputum production.  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  This has been going on for some time it does limit her mobility.  She does need refills today of medications as she ran out of all of them.  OV 08/17/2020: saw [facility] on the 5th. Started on prednisone and antibiotics for a flare.  Patient doing well at this time however does not feel like she is fully recovered.  At this point was taking 2 mg of prednisone every other day.  She is interested in going to at least once per day.  She is currently using her inhaler regimen and as prescribed.  At this time currently on Wixela.  She is not on a triple therapy regimen at the moment.  We discussed changing her inhalers.  She gets very anxious with the concept of switching her medications around as she has been stable for several years up until her recent exacerbation which does seem to have frightened her some she is improving however.  02/24/2021: Here today for follow-up of her asthma.  She has had  steroid-dependent asthma for greater than 10 years.  She has been on oral prednisone for greater than 10 years.  When we initially met back in 2021 we work to try to come off of prednisone however she continued to have flareups.  And she has significant anxiety about coming off of prednisone so she feels like she must take it.  At 1 point time she was at least on 2 mg every other day.  At this point she is back to 4 mg a day.  She also routinely self medicates with her own regiment of prednisone.  She does however felt better after being placed on the Trelegy.  She has lots of concerns about needing additional blood pressure medications because her blood pressure is now higher than it was before.  She is concerned about being started on lisinopril due to the side effects that she read about.  Therefore she is still only on just 2 blood pressure medications and did not start the third 1 as recommended by her cardiologist.  Today we had a long discussion about what happens when you take daily prednisone for many many years.  I think she now understands that her hypertension and diabetes and obesity are related to her being on prednisone for so many years.  I do not think that she can sustain on taking prednisone forever.  We have discussed the potential use and transition to [employer] again today to try  to come off of prednisone use as well as changing her inhaler regimen she was at least open to changing her inhaler regimen last time and does see the benefit of staying on Trelegy at this point but is concerned that her new occasional bouts of diarrhea may be related to the Trelegy.  08/11/2021: Patient had recent ER visit for chest tightness shortness of breath and wheezing.  Recently started her Fasenra injections.  She did stop her prednisone.  She had a lot of chest tightness coughing sputum production.  It lasted for several days.  She felt better after the increase to prednisone.  Now she is back on 4 mg of  prednisone daily.  She is only had 1 Fasenra injection.  She has her next one scheduled for tomorrow.   11/12/2021 Patient presents today for 3 month follow-up/severe persistent asthma/COPD overlap. Previously steroid dependent. She was started on Fasenra back in September 2022, self stopped prednisone and had flare. She was told to resume 4mg  prednisone taper. Maintained on Trelegy . She has has not seen much of an improvement with Fasenra injections. She does state it does take less effort to cough mucus up since starting. She would like to stay on medication a little while longer. She still gets out of breath with exertion. She was treated for bronchitis in December 2022with Augmentin. She is compliant with trelegy daily. She is not consistently taking medrol 2-4mg  every other day.    02/07/2022: Patient doing well.  She has had approximately 4 injections of Dupixent.  She feels so much better.  Dropped her prednisone to 2 mg/day.  Still using her Trelegy daily as well as albuterol.  Overall she feels better is able to get out and do more.  She thinks over the next couple of weeks she is going to try coming off of prednisone completely.  This will be the first time in a very long time that she can go without oral corticosteroids.  OV 08/10/2022: Patient here today for follow-up regarding her severe asthma.  She is doing so much better with injections of Dupixent.  She is completely off of prednisone using her Trelegy daily.  She has had no exacerbations.  She feels much better has much more energy able to do the things that she wants to do around her home.  Able to complete all of the activities of daily living.  She is much more active now in her church.  Still playing the piano and organ.  OV 01/20/2023: Here today for follow-up for severe asthma.  Doing very well on her Dupixent injections.  She did have 1 exacerbation requiring steroids this past year.  Otherwise breathing well using her  Trelegy daily.  Still playing the piano.   12/27/2023- Interim hx  Discussed the use of AI scribe software for clinical note transcription with the patient, who gave verbal consent to proceed.  History of Present Illness   The patient presents with severe asthma management and medication review.  She is currently on Dupixent and Trelegy, which have significantly improved her quality of life. She describes feeling like she has a 'new life' after previously being 'ready to die' due to uncontrolled symptoms. Before starting this regimen, she experienced persistent coughing and inflammation that left her housebound and led to incontinence from the strain of coughing. Since starting Dupixent and Trelegy, she is able to leave her house and manage her symptoms better.  She recalls being on Fasenra in the past, which provided  only short-term relief for about a month to a month and a half, after which her symptoms would return. Dupixent is the first injectable medication she has administered at home, and she receives it through a mail-order service. She has been approved for Dupixent through December 2025.  She continues to experience some coughing but notes it does not exacerbate her asthma. Her sputum is described as 'crystal clear' and not infected. She uses her rescue inhaler approximately two to four times a week and reports needing it less frequently than before starting Dupixent. She was previously on prednisone and albuterol frequently but has not required oral steroids in the past year. In the past month, her asthma has minimally impacted her daily activities, causing shortness of breath three to six times a week and waking her up at night two to three times a week. She rates her asthma control as 'almost well controlled.'         No Known Allergies  Immunization History  Administered Date(s) Administered   Fluad Quad(high Dose 65+) 08/11/2021   Influenza Split 06/30/2009, 08/14/2011,  07/11/2012, 07/16/2013, 08/02/2013, 07/24/2014, 07/16/2017   Influenza, High Dose Seasonal PF 07/30/2015, 07/18/2016, 07/14/2017, 07/26/2018, 08/06/2019   Influenza-Unspecified 08/14/2011, 07/11/2012, 07/16/2013, 07/24/2014, 06/30/2015, 07/16/2017, 08/06/2019, 08/08/2022   Moderna Covid-19 Fall Seasonal Vaccine 6yrs & older 11/30/2022, 12/14/2023   Novel Infuenza-h1n1-09 10/17/2008   PFIZER(Purple Top)SARS-COV-2 Vaccination 11/12/2019, 12/03/2019, 06/01/2020   Pfizer Covid-19 Vaccine Bivalent Booster 22yrs & up 09/03/2021   Pneumococcal Conjugate-13 01/12/2011   Pneumococcal Polysaccharide-23 08/02/2013    Past Medical History:  Diagnosis Date   Allergic rhinitis    Anxiety    Chest pain, atypical    Chronic obstructive asthma, unspecified    Diverticulosis of colon    GERD (gastroesophageal reflux disease)    Hypertension    Overweight(278.02)    Palpitations    Stress incontinence, female     Tobacco History: Social History   Tobacco Use  Smoking Status Never  Smokeless Tobacco Never   Counseling given: Not Answered   Outpatient Medications Prior to Visit  Medication Sig Dispense Refill   albuterol (PROVENTIL) (2.5 MG/3ML) 0.083% nebulizer solution INHALE THE CONTENTS OF 1 VIAL VIA NEBULIZER TWICE DAILY 540 mL 3   albuterol (VENTOLIN HFA) 108 (90 Base) MCG/ACT inhaler INHALE 2 PUFFS INTO THE LUNGS EVERY 6 (SIX) HOURS AS NEEDED FOR WHEEZING OR SHORTNESS OF BREATH 3 each 11   amLODipine (NORVASC) 10 MG tablet Take 5 mg by mouth daily.     ARTIFICIAL TEARS ophthalmic solution As needed for dry eyes     aspirin 81 MG chewable tablet Chew by mouth.     celecoxib (CELEBREX) 200 MG capsule Take 200 mg by mouth as needed.     cholecalciferol (VITAMIN D) 1000 units tablet Take 1,000 Units by mouth daily.     cyclobenzaprine (FLEXERIL) 5 MG tablet Take 5 mg by mouth 3 (three) times daily as needed for muscle spasms (2 to 3 times as needed).     Dupilumab (DUPIXENT) 300 MG/2ML  SOPN Inject 300 mg into the skin every 14 (fourteen) days. 12 mL 3   famotidine (PEPCID) 20 MG tablet TAKE 1 TABLET (20 MG TOTAL) BY MOUTH DAILY. 90 tablet 3   fexofenadine (ALLEGRA) 180 MG tablet Take 1 tablet (180 mg total) by mouth daily. Once a day as needed for allergies 90 tablet 3   fluticasone (FLONASE) 50 MCG/ACT nasal spray USE 2 SPRAYS IN EACH NOSTRIL EVERY DAY 48 g 3  ipratropium (ATROVENT) 0.02 % nebulizer solution INHALE THE CONTENTS OF 1 VIAL VIA NEBULIZER TWO TIMES DAILY 120 mL 5   loratadine (CLARITIN) 10 MG tablet Take 10 mg by mouth daily as needed.     losartan (COZAAR) 100 MG tablet Take 100 mg by mouth daily.     methylPREDNISolone (MEDROL) 4 MG tablet TAKE 1/2 TO 1 TABLET EVERY OTHER DAY. 45 tablet 3   metoprolol (LOPRESSOR) 100 MG tablet Take 100 mg by mouth daily.     montelukast (SINGULAIR) 10 MG tablet TAKE 1 TABLET AT BEDTIME 90 tablet 3   pantoprazole (PROTONIX) 40 MG tablet TAKE 1 TABLET DAILY  30 MINUTES PRIOR TO BREAKFAST 90 tablet 0   TRELEGY ELLIPTA 100-62.5-25 MCG/ACT AEPB INHALE 1 PUFF EVERY DAY 180 each 3   vitamin B-12 (CYANOCOBALAMIN) 1000 MCG tablet Take 1,000 mcg by mouth daily.     Fluticasone-Umeclidin-Vilant (TRELEGY ELLIPTA) 100-62.5-25 MCG/ACT AEPB Inhale 1 each into the lungs daily. (Patient not taking: Reported on 12/27/2023) 1 each 11   KLOR-CON SPRINKLE 10 MEQ CR capsule TAKE 2 CAPSULES TWICE DAILY (Patient not taking: Reported on 12/27/2023) 360 capsule 2   TRELEGY ELLIPTA 100-62.5-25 MCG/ACT AEPB INHALE 1 PUFF EVERY DAY (Patient not taking: Reported on 12/27/2023) 180 each 3   No facility-administered medications prior to visit.      Review of Systems  Review of Systems  Constitutional: Negative.   HENT: Negative.    Respiratory:  Positive for cough. Negative for chest tightness, shortness of breath and wheezing.   Cardiovascular: Negative.      Physical Exam  There were no vitals taken for this visit. Physical Exam Constitutional:       Appearance: Normal appearance.  HENT:     Head: Normocephalic and atraumatic.     Mouth/Throat:     Mouth: Mucous membranes are moist.     Pharynx: Oropharynx is clear.  Cardiovascular:     Rate and Rhythm: Normal rate and regular rhythm.  Pulmonary:     Effort: Pulmonary effort is normal.     Breath sounds: Normal breath sounds. No wheezing.     Comments: CTA Musculoskeletal:        General: Normal range of motion.  Skin:    General: Skin is warm and dry.  Neurological:     General: No focal deficit present.     Mental Status: She is alert and oriented to person, place, and time. Mental status is at baseline.  Psychiatric:        Mood and Affect: Mood normal.        Behavior: Behavior normal.        Thought Content: Thought content normal.        Judgment: Judgment normal.      Lab Results:  CBC    Component Value Date/Time   WBC 3.4 (L) 11/12/2021 1451   RBC 4.08 11/12/2021 1451   HGB 11.5 (L) 11/12/2021 1451   HCT 35.1 (L) 11/12/2021 1451   PLT 288.0 11/12/2021 1451   MCV 86.0 11/12/2021 1451   MCHC 32.7 11/12/2021 1451   RDW 14.2 11/12/2021 1451   LYMPHSABS 0.8 11/12/2021 1451   MONOABS 0.4 11/12/2021 1451   EOSABS 0.0 11/12/2021 1451   BASOSABS 0.0 11/12/2021 1451    BMET    Component Value Date/Time   NA 141 05/26/2009 1235   K 3.1 (L) 05/26/2009 1235   CL 103 05/26/2009 1235   CO2 31 05/26/2009 1235   GLUCOSE 89 05/26/2009  1235   BUN 10 05/26/2009 1235   CREATININE 0.7 05/26/2009 1235   CALCIUM 9.2 05/26/2009 1235   GFRNONAA 109.37 05/26/2009 1235    BNP No results found for: "BNP"  ProBNP No results found for: "PROBNP"  Imaging: No results found.   Assessment & Plan:   1. Severe persistent asthma without complication (Primary)  Severe Asthma Severe asthma managed with Dupixent injections and Trelegy , resulting in significant symptom improvement. Reduced rescue inhaler use and eliminated oral steroid need. Overall, her  asthma symptoms are well controlled. Dupixent provides longer-lasting relief compared to [facility]. - Dupixent prescription approved through December 2025. - Renew Trelegy prescription  - Continue current regimen     Glenford Bayley, NP 12/27/2023

## 2023-12-27 NOTE — Patient Instructions (Signed)
 -  SEVERE ASTHMA: Severe asthma is a chronic condition where the airways in the lungs become inflamed and narrowed, making it difficult to breathe. Your asthma is being managed with Dupixent and Trelegy, which have significantly improved your symptoms. You should continue with your current regimen of Dupixent and Trelegy. Your Dupixent prescription has been renewed through December 2025, and your Trelegy prescription will be renewed as needed.  INSTRUCTIONS: Please continue using Dupixent and Trelegy as prescribed. If you experience any worsening of symptoms or have any concerns, schedule a follow-up appointment.   Follow-up 1 year with Dr. Wynona Neat (Former [facility] patient -30 mins/asthma)

## 2024-01-08 ENCOUNTER — Other Ambulatory Visit: Payer: Self-pay | Admitting: Pulmonary Disease

## 2024-03-13 ENCOUNTER — Other Ambulatory Visit: Payer: Self-pay | Admitting: Primary Care

## 2024-03-13 DIAGNOSIS — J4550 Severe persistent asthma, uncomplicated: Secondary | ICD-10-CM

## 2024-03-13 NOTE — Telephone Encounter (Unsigned)
 Copied from CRM [id number]. Topic: Clinical - Medication Refill >> Mar 13, 2024  9:14 AM Rachael Peck wrote: Medication: Dupilumab  (DUPIXENT ) 300 MG/2ML SOPNVerdis Peck from [place] to request refills, their system is not showing avail refills and the prescription will expire on 03/28/2024   Has the patient contacted their pharmacy? Yes (Agent: If no, request that the patient contact the pharmacy for the refill. If patient does not wish to contact the pharmacy document the reason why and proceed with request.) (Agent: If yes, when and what did the pharmacy advise?)  This is the patient's preferred pharmacy:   [facility] - [place], [place] - [address] [place] [address] Phone: [phone] Fax: [phone]  Is this the correct pharmacy for this prescription? Yes If no, delete pharmacy and type the correct one.   Has the prescription been filled recently? Yes  Is the patient out of the medication? No  Has the patient been seen for an appointment in the last year OR does the patient have an upcoming appointment? Yes, 12/27/2023  Can we respond through MyChart? Yes  Agent: Please be advised that Rx refills may take up to 3 business days. We ask that you follow-up with your pharmacy.

## 2024-03-13 NOTE — Telephone Encounter (Unsigned)
 Copied from CRM [id number]. Topic: Clinical - Medication Refill >> Mar 13, 2024 10:22 AM Rachael Peck wrote: Medication:  Dupilumab  (DUPIXENT ) 300 MG/2ML SOPN   Has the patient contacted their pharmacy? Yes; pharmacy needs a new order for the supply.  (Agent: If no, request that the patient contact the pharmacy for the refill. If patient does not wish to contact the pharmacy document the reason why and proceed with request.) (Agent: If yes, when and what did the pharmacy advise?)  This is the patient's preferred pharmacy:  [facility] - [place], [place] - [address] [place] [address] Phone: [phone] Fax: [phone]  Is this the correct pharmacy for this prescription? Yes If no, delete pharmacy and type the correct one.   Has the prescription been filled recently? Yes  Is the patient out of the medication? Yes; tomorrow is her last shot that she has available. Pt is seeking advice on what she can do while she waits on the new supplies.   Has the patient been seen for an appointment in the last year OR does the patient have an upcoming appointment? Yes  Can we respond through MyChart? No; pt prefers a phone call   Agent: Please be advised that Rx refills may take up to 3 business days. We ask that you follow-up with your pharmacy.

## 2024-03-14 ENCOUNTER — Telehealth: Payer: Self-pay

## 2024-03-14 ENCOUNTER — Other Ambulatory Visit: Payer: Self-pay

## 2024-03-14 DIAGNOSIS — J4550 Severe persistent asthma, uncomplicated: Secondary | ICD-10-CM

## 2024-03-14 MED ORDER — DUPIXENT 300 MG/2ML ~~LOC~~ SOAJ: 300.0000 mg | SUBCUTANEOUS | 3 refills | Status: DC

## 2024-03-14 NOTE — Telephone Encounter (Unsigned)
 Copied from CRM [id number]. Topic: Clinical - Prescription Issue >> Mar 14, 2024 10:47 AM Rachael Peck wrote: Reason for CRM:   Pt is contacting clinic regarding her refill request for Dupixent . She has spoken with [facility] and they have not received the prescription. She is taking her last dose today and has been advised by [facility] it could take up to a week to get her medication to her. She is concerned with having to stop the medication while waiting for refills.   Requests call back from either [facility] or [facility]  # [phone]

## 2024-03-15 ENCOUNTER — Other Ambulatory Visit: Payer: Self-pay | Admitting: Pharmacist

## 2024-03-15 DIAGNOSIS — J4550 Severe persistent asthma, uncomplicated: Secondary | ICD-10-CM

## 2024-03-15 MED ORDER — DUPIXENT 300 MG/2ML ~~LOC~~ SOAJ: 300.0000 mg | SUBCUTANEOUS | 1 refills | Status: DC

## 2024-03-15 NOTE — Telephone Encounter (Signed)
 Refill sent to [facility] pharmacy today.

## 2024-03-15 NOTE — Telephone Encounter (Signed)
 This is resolved. Refill for Dupixent  has been sent to [facility]

## 2024-03-15 NOTE — Telephone Encounter (Signed)
 Called pt and notified. Nothing further needed.

## 2024-03-20 ENCOUNTER — Other Ambulatory Visit: Payer: Self-pay | Admitting: Primary Care

## 2024-04-17 ENCOUNTER — Other Ambulatory Visit: Payer: Self-pay | Admitting: Primary Care

## 2024-04-17 NOTE — Telephone Encounter (Signed)
 Called [facility] pharmacy and spoke with one of the techs, Rami.  The patient was requesting a refill of the Methlprednisone 4 mg.  She is a former [facility] patient and he had her on Methlprednisone, however per his last OV note, he was tapering her off of it in April of 2024.  I let her know that she was seen by a NP in March of 2025, however there is no mention of Methylprednisone 4mg .  I advised her that I would have to send a message to the provider.    Beth, This is just an FYI, patient is requesting a refill of the methylprednisone, however per Dr. Harriet note from 01/2023, he was tapering her off of this medication and there is no mention of this medication in your OV note from 12/2023.  Thank you.

## 2024-04-17 NOTE — Telephone Encounter (Signed)
 Copied from CRM [id number]. Topic: Clinical - Medication Refill >> Apr 17, 2024  1:22 PM Leila C wrote: Most Recent Pulmonary Care Visit:  Provider: NP, Almarie Ferrari Department: [facility] Date: 01/09/24  Medication(s): methylPREDNISolone  (MEDROL ) 4 MG tablet    Has the patient contacted their pharmacy? Yes, Hadassah gower tech from [facility] [phone] is calling to request refill.  (Agent: If no, request that the patient contact the pharmacy for the refill. If patient does not wish to contact the pharmacy document the reason why and proceed with request.) (Agent: If yes, when and what did the pharmacy advise?)  This is the patient's preferred pharmacy:  [facility] - [place], [place] - [address] [place] [address] Phone: [phone] Fax: [phone]   Is this the correct pharmacy for this prescription? Yes If no, delete pharmacy and type the correct one.   Has the prescription been filled recently? No  Is the patient out of the medication? Hadassah gower tech from [facility] is unsure  Has the patient been seen for an appointment in the last year OR does the patient have an upcoming appointment? Yes  Can we respond through MyChart? Hadassah gower tech from [facility] is unsure  Agent: Please be advised that Rx refills may take up to 3 business days. We ask that you follow-up with your pharmacy.

## 2024-04-17 NOTE — Telephone Encounter (Signed)
 She was tapered off steriod per my note in April since being on Dupixent . Maintained on Trelegy. Do not recommend refilling medrol  dose pack, if having acute or worsening symptoms please schedule OV

## 2024-05-06 ENCOUNTER — Other Ambulatory Visit: Payer: Self-pay | Admitting: Primary Care

## 2024-05-06 DIAGNOSIS — J4489 Other specified chronic obstructive pulmonary disease: Secondary | ICD-10-CM

## 2024-05-06 DIAGNOSIS — J4550 Severe persistent asthma, uncomplicated: Secondary | ICD-10-CM

## 2024-05-06 MED ORDER — ALBUTEROL SULFATE HFA 108 (90 BASE) MCG/ACT IN AERS: 2.0000 | INHALATION_SPRAY | Freq: Four times a day (QID) | RESPIRATORY_TRACT | 3 refills | Status: DC | PRN

## 2024-05-06 MED ORDER — FLUTICASONE PROPIONATE 50 MCG/ACT NA SUSP: 2.0000 | Freq: Every day | NASAL | 3 refills | Status: DC

## 2024-05-06 MED ORDER — MONTELUKAST SODIUM 10 MG PO TABS
10.0000 mg | ORAL_TABLET | Freq: Every day | ORAL | 3 refills | Status: DC
Start: 2024-05-06 — End: 2024-08-08

## 2024-05-06 NOTE — Telephone Encounter (Signed)
 Copied from CRM [id number]. Topic: Clinical - Medication Refill >> May 06, 2024 10:03 AM Rachael Peck wrote: Medication: albuterol  (VENTOLIN  HFA) 108 (90 Base) MCG/ACT inhaler   fluticasone  (FLONASE ) 50 MCG/ACT nasal spray  methylPREDNISolone  (MEDROL ) 4 MG tablet  montelukast  (SINGULAIR ) 10 MG tablet   Has the patient contacted their pharmacy? Yes, pharmacy contacted E2C2 (Agent: If no, request that the patient contact the pharmacy for the refill. If patient does not wish to contact the pharmacy document the reason why and proceed with request.) (Agent: If yes, when and what did the pharmacy advise?)  This is the patient's preferred pharmacy:  [facility] - [place], [place] - [address] [place] [address] Phone: [phone] Fax: [phone]    Is this the correct pharmacy for this prescription? Yes If no, delete pharmacy and type the correct one.   Has the prescription been filled recently? yes  Is the patient out of the medication? Unknown  Has the patient been seen for an appointment in the last year OR does the patient have an upcoming appointment? Yes  Can we respond through MyChart? No  Agent: Please be advised that Rx refills may take up to 3 business days. We ask that you follow-up with your pharmacy.

## 2024-05-14 ENCOUNTER — Ambulatory Visit: Payer: Self-pay | Admitting: Primary Care

## 2024-05-14 NOTE — Telephone Encounter (Signed)
 Needs ED evaluation to assess for PE or pneumonia due to chest pain and hemoptysis. If patient is refusing make sure she is aware that that is going against medical advice.  We can send in prednisone  taper 40mg  x 2 days, 30mg  x 2 days, 20mg  x 2 days. Needs CXR tomororw to assess

## 2024-05-14 NOTE — Telephone Encounter (Signed)
 FYI Only or Action Required?: Action required by provider: refusing 911, needs call back.  Patient is followed in Pulmonology for COPD, last seen on 12/27/2023 by Hope Almarie ORN, NP.  Called Nurse Triage reporting Ear Fullness, Cough, Chills, Fever, Shortness of Breath, Chest Pain, Back Pain, Wheezing, Diarrhea, decreased hearing, Encopresis, coughing up blood, Excessive Sweating, Nasal Congestion, just feel out of it, gagging, and not eating.  Symptoms began several days ago.  Interventions attempted: Rescue inhaler, Maintenance inhaler, Nebulizer treatments, and Increased fluids/rest.  Symptoms are: rapidly worsening.  Triage Disposition: Call EMS 911 Now  Patient/caregiver understands and will follow disposition?: No, refuses disposition    Copied from CRM #[id number]. Topic: Clinical - Red Word Triage >> May 14, 2024 10:54 AM Leila C wrote: Red Word that prompted transfer to Nurse Triage: Patient [phone] states has COPD, head congestion, stocked up hard to hear, chills, sweats, fever, diarrhea, incontinence, coughing phlegm yellow and red, shortness of breath, wheezing (using nebulizer and nasal spray), chest and back pain. Patient denies dizziness. Patient last saw NP, Hope 12/27/23. Please advise. Reason for Disposition  [1] Chest pain AND [2] difficulty breathing  Answer Assessment - Initial Assessment Questions E2C2 Pulmonary Triage - Initial Assessment Questions Chief Complaint (e.g., cough, sob, wheezing, fever, chills, sweat or additional symptoms) *Go to specific symptom protocol after initial questions. Chest pain and back pain but been using muscle rub on it on back and sides Chest pain comes and goes, had it earlier this morning but gone now, where have pressure, lasts about 5 min or so, not deep pain, just pressure Talking in full sentences Wheezing Head all stopped up, difficulty hearing 2. SEVERITY: How bad is the cough today? Did the blood appear  after a coughing spell?      Yes after a coughing spell more or less 3. SPUTUM: Describe the color of your sputum (e.g., none, dry cough; clear, white, yellow, green)     Yellow and red 4. HEMOPTYSIS: How much blood? (e.g., flecks, streaks, tablespoons)     Quite a bit, streaks and have had one that was mixed blood as much red as was yellow 5. DIFFICULTY BREATHING: Are you having difficulty breathing? If Yes, ask: How bad is it? (e.g., mild, moderate, severe)      Yes Better with nebulizer, awful in beginning, breathing is a little better, tight in chest, sides hurt from coughing Have incontinence, not her usual Not eating since Saturday having broth and water also 4 tbsp of applesauce but having diarrhea 6. FEVER: Do you have a fever? If Yes, ask: What is your temperature, how was it measured, and when did it start?     Don't have it today, don't know temperature Have had fever and chills very bad, shivering and wrapped up had to get winter coat and a spread Then sweating and had to change clothes 3x   How long have symptoms been present? Sunday  Have you used your inhalers/maintenance medication? Yes If yes, What medications? Nebulizer 3x yesterday and 1x today, up all night Rescue inhaler, used several times today, no idea Nasal spray Dupixent  every other week trelegy Get a lot of relief from inhaler and nebulizer  OXYGEN: Do you wear supplemental oxygen? No  Do you monitor your oxygen levels? No, have one but not doing anything just trying to do nebulizer and such, just feel out of it  7. CARDIAC HISTORY: Do you have any history of heart disease? (e.g., heart attack, congestive heart failure)  significant 8. LUNG HISTORY: Do you have any history of lung disease?  (e.g., pulmonary embolus, asthma, emphysema)     significant 10. OTHER SYMPTOMS: Do you have any other symptoms? (e.g., runny nose, wheezing, chest pain)       wheezing Gagging no  vomit because nothing to bring up 2.5x  No no can't go anywhere like this Incontinence to bowel starting today, no control Normally just incontinence for urinary  Not gonna have anybody with me in the house like this, this is embarrassing  Not going to the hospital    Strongly advised pt go to hospital right away, call 911, for symptoms. Pt refusing. Advised pt call back if worsening, have someone with her to ensure she is okay, pt refusing to have another person with her. Sending message to pulm for call back asap with further recommendations.  Protocols used: Coughing Up Blood-A-AH

## 2024-05-15 NOTE — Telephone Encounter (Signed)
 I called and spoke with the pt  She states that she ended up going to ED last night and had multiple tests done  She was dx with covid  Will start on paxlovid today  She is improving already with treatment given at [facility]  Nothing further needed

## 2024-07-31 ENCOUNTER — Encounter

## 2024-08-08 ENCOUNTER — Telehealth: Payer: Self-pay

## 2024-08-08 ENCOUNTER — Ambulatory Visit (INDEPENDENT_AMBULATORY_CARE_PROVIDER_SITE_OTHER)

## 2024-08-08 DIAGNOSIS — J4550 Severe persistent asthma, uncomplicated: Secondary | ICD-10-CM

## 2024-08-08 DIAGNOSIS — J4489 Other specified chronic obstructive pulmonary disease: Secondary | ICD-10-CM | POA: Diagnosis not present

## 2024-08-08 MED ORDER — TRELEGY ELLIPTA 100-62.5-25 MCG/ACT IN AEPB: 1.0000 | INHALATION_SPRAY | Freq: Every day | RESPIRATORY_TRACT | 3 refills | Status: AC

## 2024-08-08 MED ORDER — DUPIXENT 300 MG/2ML ~~LOC~~ SOAJ: 300.0000 mg | SUBCUTANEOUS | 1 refills | Status: DC

## 2024-08-08 MED ORDER — MONTELUKAST SODIUM 10 MG PO TABS: 10.0000 mg | ORAL_TABLET | Freq: Every day | ORAL | 3 refills | Status: AC

## 2024-08-08 MED ORDER — ALBUTEROL SULFATE (2.5 MG/3ML) 0.083% IN NEBU: INHALATION_SOLUTION | RESPIRATORY_TRACT | 3 refills | Status: AC

## 2024-08-08 MED ORDER — ALBUTEROL SULFATE HFA 108 (90 BASE) MCG/ACT IN AERS: 2.0000 | INHALATION_SPRAY | Freq: Four times a day (QID) | RESPIRATORY_TRACT | 3 refills | Status: AC | PRN

## 2024-08-08 MED ORDER — FLUTICASONE PROPIONATE 50 MCG/ACT NA SUSP: 2.0000 | Freq: Every day | NASAL | 3 refills | Status: AC

## 2024-08-08 NOTE — Addendum Note (Signed)
 Addended by: Sheetal Lyall L on: 08/08/2024 04:46 PM   Modules accepted: Orders

## 2024-08-08 NOTE — Patient Instructions (Signed)
  VISIT SUMMARY: Today, you came in for a follow-up visit regarding your obstructive asthma and respiratory condition. We discussed your current medications and recent health events, including your experience with COVID-19 and a recent kidney injury. Overall, your asthma is well-controlled with your current treatment plan.  YOUR PLAN: -OBSTRUCTIVE ASTHMA: Obstructive asthma is a condition where your airways become inflamed and narrowed, making it difficult to breathe. Your asthma is well-controlled with Trelegy and Dupixent . Although you had to use your albuterol  inhaler more frequently during your COVID-19 infection, your symptoms have since resolved. We will renew your prescriptions for Trelegy, montelukast , albuterol  inhaler, albuterol  nebulizer, and Dupixent . Please contact our office or use MyChart if you experience any breakthrough symptoms or have any issues with your prescriptions.  -ALLERGIC RHINITIS (SEASONAL ALLERGIES): Allergic rhinitis, commonly known as seasonal allergies, causes symptoms like a runny nose, sneezing, and congestion due to allergens. Your condition is managed with fluticasone  nasal spray, and you have not had any recent exacerbations. We will renew your prescription for fluticasone  nasal spray.  INSTRUCTIONS: We have sent all your prescriptions to the [facility]. Please schedule a follow-up appointment in six months.          Contains text generated by [employer].

## 2024-08-08 NOTE — Telephone Encounter (Signed)
 Refill sent for DUPIXENT  to [facility]: [phone]  Dose: 300mg  [place] every 14 days   Last OV: 08/08/24 Provider: Dr. Zaida   Next OV: due April 2026, not yet scheduled  Aleck Puls, PharmD, BCPS Clinical Pharmacist  [facility]

## 2024-08-08 NOTE — Progress Notes (Signed)
 Subjective:   PATIENT ID: Rachael Peck GENDER: female DOB: 07/20/1946, MRN: [id number]   HPI Discussed the use of AI scribe software for clinical note transcription with the patient, who gave verbal consent to proceed.  History of Present Illness Rachael Peck is a 78 year old female with obstructive asthma who presents for follow-up of her respiratory condition.  She has a longstanding history of obstructive asthma and respiratory complaints. She was on oral prednisone  for over ten years but has been on Dupixent  for the past two years, which has significantly improved her symptoms, allowing her to taper off prednisone . She reports only one exacerbation in 2024 for which she required steroids. She previously tried Fasenra  before switching to Dupixent , which she reports has 'fixed all her problems'.  She continues to use Trelegy and finds it effective in conjunction with Dupixent . Her use of rescue albuterol  is infrequent, approximately once or twice a month, but she had to use it continuously during a COVID-19 infection in July, which she described as 'awful'. Her symptoms cleared within a week after her COVID-19 infection.  Her current medications include Trelegy, montelukast , and fluticasone  nasal spray. She also uses a nebulizer and puts albuterol  solution in it. She has stopped using nebulized Atrovent .  She reports a recent kidney injury that began after her COVID-19 infection. She has a history of hypertension and reports recent kidney issues.  She received a flu shot recently and has had pneumonia vaccinations in the past, with records showing shots in 2012 and 2004.     Past Medical History:  Diagnosis Date   Allergic rhinitis    Anxiety    Chest pain, atypical    Chronic obstructive asthma, unspecified    Diverticulosis of colon    GERD (gastroesophageal reflux disease)    Hypertension    Overweight(278.02)    Palpitations    Stress incontinence, female       Family History  Problem Relation Age of Onset   Kidney failure Mother    Kidney failure Father      Social History   Socioeconomic History   Marital status: Single    Spouse name: Not on file   Number of children: 1   Years of education: Not on file   Highest education level: Not on file  Occupational History   Not on file  Tobacco Use   Smoking status: Never    Passive exposure: Past   Smokeless tobacco: Never  Vaping Use   Vaping status: Never Used  Substance and Sexual Activity   Alcohol use: No    Alcohol/week: 0.0 standard drinks of alcohol   Drug use: No   Sexual activity: Not on file  Other Topics Concern   Not on file  Social History Narrative   Not on file   Social Drivers of Health   Financial Resource Strain: Not on file  Food Insecurity: Low Risk  (08/07/2024)   Received from [facility]   Hunger Vital Sign    Within the past 12 months, you worried that your food would run out before you got money to buy more: Never true    Within the past 12 months, the food you bought just didn't last and you didn't have money to get more. : Never true  Transportation Needs: No Transportation Needs (08/07/2024)   Received from [facility]    In the past 12 months, has lack of reliable transportation kept you from medical appointments,  meetings, work or from getting things needed for daily living? : No  Physical Activity: Not on file  Stress: Not on file  Social Connections: Not on file  Intimate Partner Violence: Not on file     No Known Allergies   Outpatient Medications Prior to Visit  Medication Sig Dispense Refill   amLODipine (NORVASC) 10 MG tablet Take 5 mg by mouth daily.     ARTIFICIAL TEARS ophthalmic solution As needed for dry eyes     aspirin 81 MG chewable tablet Chew by mouth.     benzonatate  (TESSALON ) 100 MG capsule Take 100 mg by mouth as needed for cough.     cholecalciferol (VITAMIN D) 1000 units tablet Take 1,000 Units  by mouth daily.     Dupilumab  (DUPIXENT ) 300 MG/2ML SOAJ Inject 300 mg into the skin every 14 (fourteen) days. 12 mL 1   fexofenadine  (ALLEGRA ) 180 MG tablet Take 1 tablet (180 mg total) by mouth daily. Once a day as needed for allergies 90 tablet 3   loratadine (CLARITIN) 10 MG tablet Take 10 mg by mouth daily as needed.     losartan (COZAAR) 100 MG tablet Take 100 mg by mouth daily.     melatonin (MELATONIN MAXIMUM STRENGTH) 5 MG TABS Take 5 mg by mouth at bedtime.     methocarbamol (ROBAXIN) 500 MG tablet Take 500 mg by mouth 3 (three) times daily as needed.     metoprolol (TOPROL-XL) 200 MG 24 hr tablet Take 200 mg by mouth daily.     pantoprazole  (PROTONIX ) 40 MG tablet TAKE 1 TABLET DAILY  30 MINUTES PRIOR TO BREAKFAST 90 tablet 0   vitamin B-12 (CYANOCOBALAMIN) 1000 MCG tablet Take 1,000 mcg by mouth daily.     albuterol  (PROVENTIL ) (2.5 MG/3ML) 0.083% nebulizer solution INHALE THE CONTENTS OF 1 VIAL VIA NEBULIZER TWICE DAILY 540 mL 3   albuterol  (VENTOLIN  HFA) 108 (90 Base) MCG/ACT inhaler Inhale 2 puffs into the lungs every 6 (six) hours as needed for wheezing or shortness of breath. 3 each 3   famotidine  (PEPCID ) 20 MG tablet TAKE 1 TABLET (20 MG TOTAL) BY MOUTH DAILY. 90 tablet 3   fluticasone  (FLONASE ) 50 MCG/ACT nasal spray Place 2 sprays into both nostrils daily. 48 g 3   Fluticasone -Umeclidin-Vilant (TRELEGY ELLIPTA ) 100-62.5-25 MCG/ACT AEPB Take 1 puff by mouth daily. 180 each 3   ipratropium (ATROVENT ) 0.02 % nebulizer solution INHALE THE CONTENTS OF 1 VIAL VIA NEBULIZER TWO TIMES DAILY 180 mL 3   metoprolol (LOPRESSOR) 100 MG tablet Take 100 mg by mouth daily.     montelukast  (SINGULAIR ) 10 MG tablet Take 1 tablet (10 mg total) by mouth at bedtime. 90 tablet 3   celecoxib (CELEBREX) 200 MG capsule Take 200 mg by mouth as needed.     cyclobenzaprine (FLEXERIL) 5 MG tablet Take 5 mg by mouth 3 (three) times daily as needed for muscle spasms (2 to 3 times as needed).      methylPREDNISolone  (MEDROL ) 4 MG tablet TAKE 1/2 TO 1 TABLET EVERY OTHER DAY. 45 tablet 3   No facility-administered medications prior to visit.    ROS Reviewed all systems and reported negative except as above     Objective:   Vitals:   08/08/24 1423  BP: (!) 160/82  Pulse: 90  TempSrc: Temporal  SpO2: 100%  Weight: 184 lb 9.6 oz (83.7 kg)  Height: 5' 7 (1.702 m)    Physical Exam Physical Exam GENERAL: Appropriate to age, no acute  distress. HEAD EYES EARS NOSE THROAT: Moist mucous membranes, atraumatic, normocephalic. CHEST: Clear to auscultation bilaterally, no wheezing, no crackles, no rales. CARDIAC: Regular rate and rhythm, normal S1, normal S2, no murmurs, no rubs, no gallops. ABDOMEN: Soft, nontender. NEUROLOGICAL: Motor and sensation grossly intact, alert and oriented times X 3. EXTREMITIES: Warm, well perfused, no edema.     CBC    Component Value Date/Time   WBC 3.4 (L) 11/12/2021 1451   RBC 4.08 11/12/2021 1451   HGB 11.5 (L) 11/12/2021 1451   HCT 35.1 (L) 11/12/2021 1451   PLT 288.0 11/12/2021 1451   MCV 86.0 11/12/2021 1451   MCHC 32.7 11/12/2021 1451   RDW 14.2 11/12/2021 1451   LYMPHSABS 0.8 11/12/2021 1451   MONOABS 0.4 11/12/2021 1451   EOSABS 0.0 11/12/2021 1451   BASOSABS 0.0 11/12/2021 1451          Assessment & Plan:   Assessment and Plan Assessment & Plan Obstructive asthma Longstanding obstructive asthma well-controlled with Trelegy and Dupixent . Recent COVID infection increased albuterol  use, but symptoms resolved. No current need for Atrovent . - Renew prescriptions for Trelegy, montelukast , albuterol  inhaler, albuterol  nebulizer, and Dupixent . - Send all prescriptions to [facility]. - Advise to contact office or use MyChart for breakthrough symptoms or prescription issues. - Schedule follow-up in six months.  Allergic rhinitis (seasonal allergies) Allergic rhinitis managed with fluticasone  nasal spray.  No recent exacerbations. - Renew prescription for fluticasone  nasal spray. - Send prescription to [facility].        Rachael Herter, MD [facility] Office: [phone]

## 2024-08-08 NOTE — Telephone Encounter (Signed)
 Patient was seen in office today,will need refills soon of dupixent .

## 2024-08-08 NOTE — Telephone Encounter (Signed)
 Rx sent to wrong pharmacy - Dupixent  triaged to [facility].   Aleck Puls, PharmD, BCPS, CPP Clinical Pharmacist  [facility]

## 2024-10-04 ENCOUNTER — Other Ambulatory Visit: Payer: Self-pay

## 2024-10-04 DIAGNOSIS — J4550 Severe persistent asthma, uncomplicated: Secondary | ICD-10-CM

## 2024-10-04 NOTE — Telephone Encounter (Unsigned)
 Copied from CRM #[id number]. Topic: Clinical - Medication Refill >> Oct 04, 2024 10:39 AM Lavanda D wrote: Medication: Dupilumab  (DUPIXENT ) 300 MG/2ML SOAJ Patient needs a rx to be sent to Dupixent  [facility] program so that they can send a refill. She needs the rx sent before 1/1.   Has the patient contacted their pharmacy? No (Agent: If no, request that the patient contact the pharmacy for the refill. If patient does not wish to contact the pharmacy document the reason why and proceed with request.) (Agent: If yes, when and what did the pharmacy advise?)  This is the patient's preferred pharmacy:  [facility] [place], [place] - [address] [place] [address] Phone: [phone] Fax: [phone]  Is this the correct pharmacy for this prescription? Yes If no, delete pharmacy and type the correct one.   Has the prescription been filled recently? No  Is the patient out of the medication? Yes  Has the patient been seen for an appointment in the last year OR does the patient have an upcoming appointment? Yes  Can we respond through MyChart? Yes  Agent: Please be advised that Rx refills may take up to 3 business days. We ask that you follow-up with your pharmacy.

## 2024-10-07 MED ORDER — DUPIXENT 300 MG/2ML ~~LOC~~ SOAJ: 300.0000 mg | SUBCUTANEOUS | 1 refills | Status: AC

## 2024-10-07 NOTE — Telephone Encounter (Signed)
 Refill sent for DUPIXENT  to [facility] ([facility]): [phone]  Dose: 300mg  [place] every 14 days   Last OV: 08/08/24 Provider: Dr. Zaida  Next OV: 01/07/2025  Rachael Peck, PharmD, BCPS Clinical Pharmacist  [facility]

## 2025-01-07 ENCOUNTER — Ambulatory Visit
